# Patient Record
Sex: Female | Born: 1940 | Race: Black or African American | Hispanic: No | Marital: Single | Smoking: Never smoker
Health system: Southern US, Community
[De-identification: ages and names within clinical notes are randomized; demographics above are authoritative.]

## PROBLEM LIST (undated history)

## (undated) DIAGNOSIS — E7800 Pure hypercholesterolemia, unspecified: Secondary | ICD-10-CM

## (undated) DIAGNOSIS — I639 Cerebral infarction, unspecified: Secondary | ICD-10-CM

## (undated) DIAGNOSIS — I10 Essential (primary) hypertension: Secondary | ICD-10-CM

## (undated) HISTORY — PX: TOOTH EXTRACTION: SUR596

---

## 1999-04-15 ENCOUNTER — Encounter: Admission: RE | Admit: 1999-04-15 | Discharge: 1999-07-14 | Payer: Self-pay | Admitting: *Deleted

## 1999-06-30 ENCOUNTER — Other Ambulatory Visit: Admission: RE | Admit: 1999-06-30 | Discharge: 1999-06-30 | Payer: Self-pay | Admitting: *Deleted

## 1999-10-28 ENCOUNTER — Encounter: Admission: RE | Admit: 1999-10-28 | Discharge: 1999-10-28 | Payer: Self-pay | Admitting: *Deleted

## 1999-10-28 ENCOUNTER — Encounter: Payer: Self-pay | Admitting: *Deleted

## 2001-03-15 ENCOUNTER — Encounter: Payer: Self-pay | Admitting: Internal Medicine

## 2001-03-15 ENCOUNTER — Encounter: Admission: RE | Admit: 2001-03-15 | Discharge: 2001-03-15 | Payer: Self-pay | Admitting: Internal Medicine

## 2002-01-23 ENCOUNTER — Other Ambulatory Visit: Admission: RE | Admit: 2002-01-23 | Discharge: 2002-01-23 | Payer: Self-pay | Admitting: Internal Medicine

## 2002-05-29 ENCOUNTER — Ambulatory Visit (HOSPITAL_BASED_OUTPATIENT_CLINIC_OR_DEPARTMENT_OTHER): Admission: RE | Admit: 2002-05-29 | Discharge: 2002-05-29 | Payer: Self-pay | Admitting: Surgery

## 2002-05-29 ENCOUNTER — Encounter (INDEPENDENT_AMBULATORY_CARE_PROVIDER_SITE_OTHER): Payer: Self-pay | Admitting: *Deleted

## 2003-01-23 ENCOUNTER — Emergency Department (HOSPITAL_COMMUNITY): Admission: EM | Admit: 2003-01-23 | Discharge: 2003-01-23 | Payer: Self-pay | Admitting: Emergency Medicine

## 2003-09-29 ENCOUNTER — Emergency Department (HOSPITAL_COMMUNITY): Admission: EM | Admit: 2003-09-29 | Discharge: 2003-09-29 | Payer: Self-pay | Admitting: Emergency Medicine

## 2004-03-27 ENCOUNTER — Inpatient Hospital Stay (HOSPITAL_COMMUNITY): Admission: EM | Admit: 2004-03-27 | Discharge: 2004-03-30 | Payer: Self-pay | Admitting: Neurology

## 2004-03-27 ENCOUNTER — Encounter: Payer: Self-pay | Admitting: Cardiovascular Disease

## 2004-03-27 ENCOUNTER — Encounter: Payer: Self-pay | Admitting: Emergency Medicine

## 2004-04-09 ENCOUNTER — Encounter: Admission: RE | Admit: 2004-04-09 | Discharge: 2004-05-07 | Payer: Self-pay | Admitting: Neurology

## 2004-05-28 ENCOUNTER — Encounter: Admission: RE | Admit: 2004-05-28 | Discharge: 2004-08-26 | Payer: Self-pay | Admitting: Internal Medicine

## 2004-07-07 ENCOUNTER — Encounter: Admission: RE | Admit: 2004-07-07 | Discharge: 2004-07-07 | Payer: Self-pay | Admitting: Internal Medicine

## 2005-09-15 ENCOUNTER — Encounter: Admission: RE | Admit: 2005-09-15 | Discharge: 2005-09-15 | Payer: Self-pay | Admitting: Internal Medicine

## 2007-01-02 ENCOUNTER — Ambulatory Visit: Payer: Self-pay | Admitting: *Deleted

## 2007-01-02 ENCOUNTER — Inpatient Hospital Stay (HOSPITAL_COMMUNITY): Admission: EM | Admit: 2007-01-02 | Discharge: 2007-01-09 | Payer: Self-pay | Admitting: *Deleted

## 2007-01-03 ENCOUNTER — Ambulatory Visit: Payer: Self-pay | Admitting: Physical Medicine & Rehabilitation

## 2008-02-14 ENCOUNTER — Encounter: Admission: RE | Admit: 2008-02-14 | Discharge: 2008-02-14 | Payer: Self-pay | Admitting: Internal Medicine

## 2009-02-18 ENCOUNTER — Encounter: Admission: RE | Admit: 2009-02-18 | Discharge: 2009-02-18 | Payer: Self-pay | Admitting: Internal Medicine

## 2009-12-23 ENCOUNTER — Emergency Department (HOSPITAL_COMMUNITY): Admission: EM | Admit: 2009-12-23 | Discharge: 2009-12-23 | Payer: Self-pay | Admitting: Emergency Medicine

## 2010-06-22 ENCOUNTER — Encounter: Payer: Self-pay | Admitting: Internal Medicine

## 2010-07-16 ENCOUNTER — Emergency Department (HOSPITAL_COMMUNITY): Payer: Medicare HMO

## 2010-07-16 ENCOUNTER — Inpatient Hospital Stay (HOSPITAL_COMMUNITY)
Admission: EM | Admit: 2010-07-16 | Discharge: 2010-07-22 | DRG: 065 | Disposition: A | Discharge status: Skilled Nursing Facility | Payer: Medicare HMO | Attending: Internal Medicine | Admitting: Internal Medicine

## 2010-07-16 DIAGNOSIS — E119 Type 2 diabetes mellitus without complications: Secondary | ICD-10-CM | POA: Diagnosis present

## 2010-07-16 DIAGNOSIS — Z91199 Patient's noncompliance with other medical treatment and regimen due to unspecified reason: Secondary | ICD-10-CM

## 2010-07-16 DIAGNOSIS — G8190 Hemiplegia, unspecified affecting unspecified side: Secondary | ICD-10-CM | POA: Diagnosis present

## 2010-07-16 DIAGNOSIS — I10 Essential (primary) hypertension: Secondary | ICD-10-CM | POA: Diagnosis present

## 2010-07-16 DIAGNOSIS — E785 Hyperlipidemia, unspecified: Secondary | ICD-10-CM | POA: Diagnosis present

## 2010-07-16 DIAGNOSIS — I6340 Cerebral infarction due to embolism of unspecified cerebral artery: Principal | ICD-10-CM | POA: Diagnosis present

## 2010-07-16 DIAGNOSIS — R269 Unspecified abnormalities of gait and mobility: Secondary | ICD-10-CM | POA: Diagnosis present

## 2010-07-16 DIAGNOSIS — R1310 Dysphagia, unspecified: Secondary | ICD-10-CM | POA: Diagnosis present

## 2010-07-16 DIAGNOSIS — R4701 Aphasia: Secondary | ICD-10-CM | POA: Diagnosis present

## 2010-07-16 DIAGNOSIS — Z8673 Personal history of transient ischemic attack (TIA), and cerebral infarction without residual deficits: Secondary | ICD-10-CM | POA: Diagnosis present

## 2010-07-16 DIAGNOSIS — K5900 Constipation, unspecified: Secondary | ICD-10-CM | POA: Diagnosis present

## 2010-07-16 DIAGNOSIS — Z9119 Patient's noncompliance with other medical treatment and regimen: Secondary | ICD-10-CM

## 2010-07-16 LAB — GLUCOSE, CAPILLARY: Glucose-Capillary: 221 mg/dL — ABNORMAL HIGH (ref 70–99)

## 2010-07-16 LAB — CBC
HCT: 42.3 % (ref 36.0–46.0)
MCHC: 32.2 g/dL (ref 30.0–36.0)
RBC: 5.06 MIL/uL (ref 3.87–5.11)
RDW: 14.4 % (ref 11.5–15.5)
WBC: 8.3 10*3/uL (ref 4.0–10.5)

## 2010-07-16 LAB — URINALYSIS, ROUTINE W REFLEX MICROSCOPIC
Ketones, ur: NEGATIVE mg/dL
Leukocytes, UA: NEGATIVE
Nitrite: NEGATIVE
Urobilinogen, UA: 0.2 mg/dL (ref 0.0–1.0)
pH: 7 (ref 5.0–8.0)

## 2010-07-16 LAB — POCT CARDIAC MARKERS
CKMB, poc: 2.2 ng/mL (ref 1.0–8.0)
Myoglobin, poc: 284 ng/mL (ref 12–200)
Troponin i, poc: <0.05 ng/mL (ref 0.00–0.09)

## 2010-07-16 LAB — RAPID URINE DRUG SCREEN, HOSP PERFORMED
Amphetamines: NOT DETECTED
Benzodiazepines: NOT DETECTED
Cocaine: NOT DETECTED
Tetrahydrocannabinol: NOT DETECTED

## 2010-07-16 LAB — POCT I-STAT, CHEM 8
BUN: 12 mg/dL (ref 6–23)
Creatinine, Ser: 1 mg/dL (ref 0.4–1.2)
Glucose, Bld: 113 mg/dL — ABNORMAL HIGH (ref 70–99)
HCT: 46 % (ref 36.0–46.0)
Hemoglobin: 15.6 g/dL — ABNORMAL HIGH (ref 12.0–15.0)
Potassium: 5.1 mEq/L (ref 3.5–5.1)
TCO2: 29 mmol/L (ref 0–100)

## 2010-07-16 LAB — DIFFERENTIAL
Basophils Absolute: 0 10*3/uL (ref 0.0–0.1)
Lymphocytes Relative: 12 % (ref 12–46)
Lymphs Abs: 1 10*3/uL (ref 0.7–4.0)
Monocytes Relative: 11 % (ref 3–12)
Neutro Abs: 6.4 10*3/uL (ref 1.7–7.7)

## 2010-07-17 ENCOUNTER — Inpatient Hospital Stay (HOSPITAL_COMMUNITY): Payer: Medicare HMO

## 2010-07-17 DIAGNOSIS — I6789 Other cerebrovascular disease: Secondary | ICD-10-CM

## 2010-07-17 DIAGNOSIS — I6350 Cerebral infarction due to unspecified occlusion or stenosis of unspecified cerebral artery: Secondary | ICD-10-CM

## 2010-07-17 LAB — COMPREHENSIVE METABOLIC PANEL
AST: 24 U/L (ref 0–37)
Alkaline Phosphatase: 63 U/L (ref 39–117)
Creatinine, Ser: 1.12 mg/dL (ref 0.4–1.2)
Potassium: 3.7 mEq/L (ref 3.5–5.1)
Total Bilirubin: 0.5 mg/dL (ref 0.3–1.2)
Total Protein: 6.3 g/dL (ref 6.0–8.3)

## 2010-07-17 LAB — LIPID PANEL
Cholesterol: 187 mg/dL (ref 0–200)
HDL: 52 mg/dL (ref >39)
LDL Cholesterol: 122 mg/dL — ABNORMAL HIGH (ref 0–99)
Total CHOL/HDL Ratio: 3.6 RATIO
Triglycerides: 67 mg/dL (ref <150)

## 2010-07-17 LAB — GLUCOSE, CAPILLARY
Glucose-Capillary: 135 mg/dL — ABNORMAL HIGH (ref 70–99)
Glucose-Capillary: 88 mg/dL (ref 70–99)

## 2010-07-17 LAB — CARDIAC PANEL(CRET KIN+CKTOT+MB+TROPI)
CK, MB: 1.5 ng/mL (ref 0.3–4.0)
CK, MB: 1.3 ng/mL (ref 0.3–4.0)
Relative Index: 1 (ref 0.0–2.5)
Relative Index: 0.7 (ref 0.0–2.5)
Troponin I: 0.01 ng/mL (ref 0.00–0.06)

## 2010-07-17 LAB — HEMOGLOBIN A1C
Hgb A1c MFr Bld: 7 % — ABNORMAL HIGH (ref <5.7)
Mean Plasma Glucose: 154 mg/dL — ABNORMAL HIGH (ref <117)

## 2010-07-18 LAB — GLUCOSE, CAPILLARY: Glucose-Capillary: 97 mg/dL (ref 70–99)

## 2010-07-19 LAB — BASIC METABOLIC PANEL
CO2: 27 mEq/L (ref 19–32)
Calcium: 8.9 mg/dL (ref 8.4–10.5)
GFR calc Af Amer: >60 mL/min (ref >60)
GFR calc non Af Amer: >60 mL/min (ref >60)
Sodium: 142 mEq/L (ref 135–145)

## 2010-07-19 LAB — GLUCOSE, CAPILLARY: Glucose-Capillary: 220 mg/dL — ABNORMAL HIGH (ref 70–99)

## 2010-07-19 NOTE — Consult Note (Signed)
[name] [name]                 ACCOUNT NO.:  [id number]  MEDICAL RECORD NO.:  [id number]           PATIENT TYPE:  I  LOCATION:  1408                         FACILITY:  [facility]  PHYSICIAN:  Thana Farr, MD    DATE OF BIRTH:  Jun 09, 1940  DATE OF CONSULTATION:  07/18/2010 DATE OF DISCHARGE:                                CONSULTATION   HISTORY:  [name] [name] is a 70 year old female with a history of strokes in the past as well as hypertension and diabetes who presented with complaints of right upper extremity weakness.  The patient has a residual right hemiparesis from her previous strokes but noted when she was in the bank that she was unable to use her right upper extremity as well as she had previously been able to.  The patient presented for evaluation.  Has had an MRI of the brain.  MRI shows multiple acute infarcts, most notably in the bilateral basal ganglion more on the right than the left and also in the inferior cerebellum on the right.  MRA was performed that showed no proximal stenosis.  There was diffuse small vessel arteriosclerosis noted.  The patient reports that she has not seen her doctor in some time.  She reports not liking doctors.  She also reports not being noncompliant with her medications, and actually, her hemoglobin A1c is elevated at 7 and her blood pressure was elevated on admission as well at 203/102.  PAST MEDICAL HISTORY: 1. 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 2. Diabetes. 3. Hypertension. 4. Hypercholesterolemia.  MEDICATIONS AT HOME: 1. Metformin. 2. Amaryl. 3. Januvia. 4. Lisinopril. 5. Cardizem. 6. Simvastatin.  CURRENT MEDICATIONS: 1. Aspirin. 2. Plavix. 3. Diltiazem. 4. Lovenox. 5. Insulin. 6. Lisinopril. 7. Crestor.  SOCIAL HISTORY:  The patient lives alone.  She has no history of alcohol or illicit drug abuse.  She does not smoke.  She is retired.  PHYSICAL EXAMINATION:  VITAL SIGNS:  Blood pressure  201/74, heart rate 69, respiratory rate 16, T-max 99.3. MENTAL STATUS TESTING:  The patient is resting when I entered the room. It does take some effort to arouse her, but she is able then to follow commands and participate with the examination.  Speech is slurred. NEUROLOGIC:  On cranial nerve testing, II; visual fields grossly intact. III, IV, and VI; extraocular movements intact.  Pupils reactive.  V and VII; right facial droop.  VIII; grossly intact.  IX and X; decreased gag.  XI; bilateral shoulder shrug.  XII; tongue deviation to the right. On motor testing, the patient has 5/5 on both the left upper and lower extremities.  On the right upper extremity, the patient is 3+/5 and has a 3+/5 hand grip.  She is 4+/5 on the right lower extremity.  On sensory testing, the patient was intact to pinprick and light touch bilaterally. Deep tendon reflexes are increased on the right.  They are at 2+ on the right and 1+ on the left.  Plantars upgoing on the right and downgoing on the left.  On cerebellar testing, finger-to-nose is intact in the left upper extremity.  It is difficult to perform with the right upper extremity secondary to weakness.  It is intact in the bilateral lower extremities.  LABORATORY DATA:  White blood cell count 8.3, platelet count 256,000, hemoglobin/hematocrit are 15.6 and 46.0 respectively.  PT, INR, and PTT are 13.5, 1.01, and 35 respectively.  Sodium 141, potassium 3.7, chloride 106, CO2 of 27, BUN and creatinine 9 and 1.12 respectively. Glucose 172.  Hemoglobin A1c 7.  Troponins are negative.  Cholesterol is 187, LDL 122, calcium 9, protein 6.3, bilirubin 0.5, alk phos 63, SGOT and SGPT 24 and 17 respectively.  ASSESSMENT:  [name] [name] is a 70 year old female with acute infarcts in multiple vascular distributions.  I am concerned about an embolic etiology.  The patient is also noncompliant with medications and with her medical issues.  This needs to be stressed  as well.  PLAN: 1. Agree with an aspirin a day for stroke prophylaxis. 2. Agree with PT/OT. 3. Compliance stressed to the patient. 4. If echocardiogram is unremarkable, TEE recommended.          ______________________________ Thana Farr, MD     LR/MEDQ  D:  07/18/2010  T:  07/18/2010  Job:  [id number]  Electronically Signed by Thana Farr MD on 07/19/2010 12:49:09 AM

## 2010-07-20 LAB — BASIC METABOLIC PANEL
BUN: 11 mg/dL (ref 6–23)
Chloride: 108 mEq/L (ref 96–112)
Glucose, Bld: 129 mg/dL — ABNORMAL HIGH (ref 70–99)
Potassium: 3.4 mEq/L — ABNORMAL LOW (ref 3.5–5.1)

## 2010-07-20 LAB — CBC
HCT: 40.7 % (ref 36.0–46.0)
MCV: 85 fL (ref 78.0–100.0)
RBC: 4.79 MIL/uL (ref 3.87–5.11)
WBC: 6.7 10*3/uL (ref 4.0–10.5)

## 2010-07-20 LAB — DIFFERENTIAL
Lymphocytes Relative: 28 % (ref 12–46)
Lymphs Abs: 1.9 10*3/uL (ref 0.7–4.0)
Neutrophils Relative %: 60 % (ref 43–77)

## 2010-07-20 LAB — GLUCOSE, CAPILLARY
Glucose-Capillary: 123 mg/dL — ABNORMAL HIGH (ref 70–99)
Glucose-Capillary: 108 mg/dL — ABNORMAL HIGH (ref 70–99)
Glucose-Capillary: 157 mg/dL — ABNORMAL HIGH (ref 70–99)

## 2010-07-21 LAB — GLUCOSE, CAPILLARY
Glucose-Capillary: 108 mg/dL — ABNORMAL HIGH (ref 70–99)
Glucose-Capillary: 101 mg/dL — ABNORMAL HIGH (ref 70–99)

## 2010-07-22 LAB — GLUCOSE, CAPILLARY: Glucose-Capillary: 138 mg/dL — ABNORMAL HIGH (ref 70–99)

## 2010-08-06 NOTE — Discharge Summary (Signed)
[name], [name]                 ACCOUNT NO.:  [id number]  MEDICAL RECORD NO.:  [id number]           PATIENT TYPE:  I  LOCATION:  1408                         FACILITY:  [facility]  PHYSICIAN:  Georgann Housekeeper, MD      DATE OF BIRTH:  05/25/1941  DATE OF ADMISSION:  07/16/2010 DATE OF DISCHARGE:  07/21/2010                              DISCHARGE SUMMARY   DISCHARGE DIAGNOSES: 1. Acute cerebrovascular accident with left cerebrovascular accident     with right hemiparesis. 2. Dysphagia. 3. Aphasia.  The MRI suggest multiple acute infarcts in the bilateral     basal ganglia and on the right and on the left and inferior     cerebellar on the right.  MRA no proximal stenosis and diffuse     arthroscopic disease.  A 2-D echocardiogram showed normal LV     function without any emboli.  Carotid ultrasound no evidence of any     acute plaque and dysphagia due to stroke. 4. Gait disorder with some right-sided weakness, upper extremity more     than the lower extremity. 5. History of hypertension with noncompliance. 6. History of type 2 diabetes. 7. History of dyslipidemia. 8. History of previous stroke in 2008 with some right residual     hemiparesis on medication. 9. Constipation.  MEDICATION AT DISCHARGE: 1. Lisinopril 20 mg b.i.d. 2. Tylenol 650 mg p.o. q.4 h. p.r.n. 3. Metformin 500 mg one p.o. b.i.d. 4. Cardizem CD 240 mg p.o. daily. 5. MiraLax 17 g p.o. daily. 6. Dulcolax 10 mg suppositories p.r.n. daily. 7. Januvia 100 mg daily. 8. Crestor 5 mg q.h.s. 9. Dysphagia diet with thickened. 10.Aspirin 325 mg p.o. daily. 11.Plavix 75 mg p.o. daily.  CONSULTATION:  Neurology.  PROCEDURES:  Radiology procedures includes CT scan which showed no acute infarct.  MRI suggest multiple infarct in the basal ganglions with old lacunar infarct and also on the inferior cerebellum on the right.  MRA, no acute stenosis.  A 2-D echo showed normal LV function without any emboli and normal valves.   Carotid ultrasound, no significant stenosis.  LABORATORY DATA:  CBC, hemoglobin of 12.9 and white count of 6.7. Potassium of 3.4, BUN of 11, creatinine of 0.9, and glucose of 129. Cardiac markers x3 negative.  Cholesterol total 187 with LDL of 122. Urine negative.  Cultures negative.  Hemoglobin A1c 7.0.  PHYSICAL EXAMINATION:  VITAL SIGNS:  Blood pressure range from anywhere systolic 143 to 180, heart rate in the 50s to 60s in normal sinus rhythm, and with blood sugars 108 to 150.  HOSPITAL COURSE: 1. This is a 70 year old female who was noncompliant with medication.     She has stopped taking her blood pressure and diabetic medication     and came in with elevated hypertension over 200, found to have     right-sided weakness, dysarthria, and dysphagia, found to have     acute stroke and multiple punctate on the MRI and she was started     on Plavix and aspirin.  Neurology was consulted because of the     multiple small emboli thought to  be embolic in origin.  Her 2-D     echocardiogram was negative, suggest a TEE because of her noncompliance with the medication in the past.  Decided to hold off     the TEE because she would not a good candidate for Coumadin at this     point because her gait disorder as well as her noncompliance and     the risk for bleeding.  The patient will be maintained on aspirin     and Plavix at this time. 2. History of hypertension with accelerated hypertension with stopping     the medication she was started back on her lisinopril which was     dose adjusted to 20 mg b.i.d. from 30 mg.  Continued on Cardizem     240 mg.  Blood pressure range from 140 to 180 if needed medication     could be adjusted to keep the blood pressure below 180 and above     120. 3. Type 2 diabetes.  The patient was not taking a diabetic medication.     She was restarted back on metformin and Januvia.  She was also on     Amaryl at home which she was not taking.  Her blood sugar  remained     stable in the 108 to 150 with the Januvia and metformin.  Monitor     blood sugars and CBGs in the morning and if needed we can add the     low dose of Amaryl for glucose control. 4. Dyslipidemia.  She was on Zocor at home, but because of interaction     with the diltiazem was switched to Crestor 5 mg daily and as far as     her dysphagia she is on dysphagia diet with PT and OT.     Recommending stiff care and because the patient lives alone as far     as she will be transferred to the nursing facility with rehab and     speech therapy and dysphagia diet. 5. Constipation.  The patient did well with MiraLax and Dulcolax     p.r.n.  Maintain that with a diet as far as the followup after she     comes out of the nursing facility.  Discharge planning time taken     greater than 30 minutes.     Georgann Housekeeper, MD     KH/MEDQ  D:  07/21/2010  T:  07/21/2010  Job:  [id number]  Electronically Signed by Georgann Housekeeper MD on 08/04/2010 09:39:28 PM

## 2010-08-15 LAB — POCT I-STAT, CHEM 8
Chloride: 104 mEq/L (ref 96–112)
Creatinine, Ser: 1 mg/dL (ref 0.4–1.2)
Glucose, Bld: 124 mg/dL — ABNORMAL HIGH (ref 70–99)
Hemoglobin: 15.6 g/dL — ABNORMAL HIGH (ref 12.0–15.0)
Potassium: 3.4 mEq/L — ABNORMAL LOW (ref 3.5–5.1)
Sodium: 140 mEq/L (ref 135–145)

## 2010-08-15 LAB — POCT URINALYSIS DIP (DEVICE)
Bilirubin Urine: NEGATIVE
Glucose, UA: NEGATIVE mg/dL
Ketones, ur: NEGATIVE mg/dL
Specific Gravity, Urine: 1.025 (ref 1.005–1.030)

## 2010-08-16 NOTE — H&P (Signed)
[name], [name]                 ACCOUNT NO.:  [id number]  MEDICAL RECORD NO.:  [id number]           PATIENT TYPE:  E  LOCATION:  WLED                         FACILITY:  [facility]  PHYSICIAN:  [name] Scott, MD     DATE OF BIRTH:  July 06, 1940  DATE OF ADMISSION:  07/16/2010 DATE OF DISCHARGE:                             HISTORY & PHYSICAL   PRIMARY CARE PHYSICIAN:  [name] Housekeeper, MD  CHIEF COMPLAINT:  Right-sided weakness.  HISTORY OF PRESENT ILLNESS:  Ms. [name] is a pleasant 70 year old [name] female patient with history of 2 prior strokes with residual right hemiparesis,  type 2 diabetes mellitus, hypertension, hypercholesterolemia who has been noncompliant with her medication for months.  She was at a bank and noticed sudden onset of right-sided weakness at approximately 3 p.m. yesterday.  She found that she was unable to write a cheque or hold a pen.  She indicated that her right upper extremity weakness was greater than the right lower extremity weakness.  She at baseline has some slurred speech and facial asymmetry and did not seem to find any difference in that.  There was no history of headache or chest pain or palpitations or dyspnea.  She did not lose consciousness or fall. She presented to the emergency room where her initial blood pressure was 203/102 mmHg.  The [facility] hospitalist were requested to admit her for further management.  PAST MEDICAL HISTORY: 1. 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 2. Type 2 diabetes mellitus. 3. Hypertension. 4. Hyperlipidemia.  PAST SURGICAL HISTORY:  None.  ALLERGIES:  No known drug allergies.  HOME MEDICATIONS:  The patient has not taken her medications for several months.  She was on, 1. Metformin 500 mg p.o. b.i.d. 2. Amaryl 4 mg p.o. daily. 3. Januvia 100 mg p.o. daily. 4. Lisinopril 30 mg p.o. daily. 5. Cardizem CD 240 mg p.o. daily. 6. Simvastatin 20 mg p.o. daily.  FAMILY HISTORY:  The  patient's mother is 42 years old and is living at a nursing home.  She has history of heart disease and dementia and is not able to care for herself.  SOCIAL HISTORY:  The patient is single.  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  She ambulates and drags her right feet, but does not use a cane or walker.  She is independent of activities of daily living.  She never smoked and does not have any history of alcohol or drug abuse.  Advance directives, full code.  REVIEW OF SYSTEMS:  All systems reviewed and apart from history of presenting illness is positive for urinary frequency, but no dysuria or fever or chills.  The patient indicates that she has had food to eat since her symptoms began yesterday and has had no difficulty swallowing or cough.  The patient has passed the swallow screen by the nursing in the ED.  PHYSICAL EXAMINATION:  GENERAL:  Ms. [name] is a moderately built and nourished female patient who is in no obvious distress. VITAL SIGNS:  Temperature 99.2 degrees Fahrenheit, blood pressure  which was 203/102 mmHg is now 179/78 mmHg, pulse 77 per minute, respiration 18 per minute, and saturating at 94% on room air. HEENT:  Nontraumatic, normocephalic.  Pupils equally reacting to light and accommodation.  Bilateral immature cataracts.  Oral mucosa is moist. NECK:  Supple.  No JVD or carotid bruit. LYMPHATICS:  No lymphadenopathy. RESPIRATORY SYSTEM:  Clear to auscultation.  No increased work of breathing. CARDIOVASCULAR SYSTEM:  First and second heart sounds heard.  Regular rate and rhythm.  No murmurs, rubs, gallops, or clicks. ABDOMEN:  Nondistended, nontender, soft, normal bowel sounds heard.  No organomegaly or mass appreciated. CENTRAL NERVOUS SYSTEM:  The patient is awake, alert, oriented x3.  The patient has dysarthria, question aphasia, and facial asymmetry. EXTREMITIES:  With grade 3/5 power in the right side and  grade 5/5 power in the left side.  No pedal edema.  Peripheral pulses symmetrically felt. SKIN:  Without any rashes. MUSCULOSKELETAL SYSTEM:  Negative.  LABORATORY DATA:  INR is 1.01.  Urinalysis is not suggestive of urinary tract infection, but has 100 mg/dL of protein, blood alcohol level was less than 5. Point-of-care cardiac markers only shows myoglobin of 284, otherwise negative.  A urine drug screen is negative.  CBCs within normal limits. Basic metabolic panel only remarkable for glucose of 113.  1. Chest x-ray - impression:  Cardiomegaly.  No active disease. 2. CT of the head without contrast - impression:     a.     Scattered remote lacunar infarcts.     b.     Periventricular and corona radiata white matter      hypodensities are most compatible with chronic ischemic      microvascular white matter disease.     c.     No acute intracranial findings are evident on this exam.  EKG is sinus rhythm at 74 beats per minute, normal axis, left ventricular hypertrophy with repolarization abnormalities, Q waves in lead V1 and V2.  ASSESSMENT AND PLAN: 1. Left brain stroke, likely infarct with right hemiparesis.  This isrecurrent cerebrovascular accident.  The patient has had a left     posterior limb internal capsule and paracentral pons and inferior     left cerebellum strokes in the past. She is beyond window for tPA.     She will be admitted to Telemetry.  We will initiate stroke workup     including MRI, MRA of the head, 2-D echocardiogram, carotid     Dopplers, hemoglobin A1c, fasting lipids.  The patient has passed     bedside swallow evaluation by RN.  She will be initiated on a     diabetic diet.  We will request physical therapy and occupational     therapy evaluation. Obviously, it has been explained to the patient     that she has to comply with medical management of the secondary     risk factors such as diabetes, hypertension, and     hypercholesterolemia.  Consider  neurology consultation if deemed     necessary. 2. Uncontrolled hypertension/hypertensive urgency.  The patient has     received a dose of IV labetalol and nitroglycerin paste with     improved blood pressure control.  We will initiate lisinopril and     p.r.n. hydralazine.  We will allow permissive hypertension and     treat if blood pressures are greater than 180/110. 3. Type 2 diabetes mellitus.  We will check hemoglobin A1c and place     on sliding  scale insulin and hold oral medications. 4. Hypercholesterolemia.  We will initiate simvastatin. 5. Medication noncompliance.  Again the patient has to be counseled     repeatedly regarding compliance with medications. 6. The patient is a full code.  Time taken in coordinating this history and physical note is 1 hour.     [name] Scott, MD     AH/MEDQ  D:  07/16/2010  T:  07/16/2010  Job:  [id number]  cc:   [name] Housekeeper, MD Fax: [phone]  Electronically Signed by [name] Scott MD on 08/16/2010 11:25:39 PM

## 2010-10-13 NOTE — H&P (Signed)
NAMECRYSTIN, [name]                 ACCOUNT NO.:  [id number]   MEDICAL RECORD NO.:  [id number]          PATIENT TYPE:  INP   LOCATION:  1440                         [facility]AC[facility]   PHYSICIAN:  Georgann Housekeeper, MD      DATE OF BIRTH:  08/16/1940   DATE OF ADMISSION:  01/01/2007  DATE OF DISCHARGE:                              HISTORY & PHYSICAL   [name] is a 70 year old female with past medical history of a right-  sided CVA, diabetes mellitus and hypertension who is coming in today  with approximately 12 hours of confusion and slurred speech.  This was  noticed by a friend who called her on the phone and then came over to  her house and noticed that she was somewhat confused, was having slurred  speech.  She was brought to the emergency department where over the  course of the next couple hours, her slurring speech had improved to the  point where she was almost back to normal according to the friend.  She  does have some deficits from a previous stroke including some facial  droop on the right and some right-sided weakness as well as some slurred  speech.  However, today's episode was worse then her normal self.  When  she woke up on Sunday morning, she was fine and she was able to go to  church and she was otherwise well. She denies any headache, dizziness,  blurred vision, chest pain, dysphagia, and her other review of systems  otherwise negative.   PAST MEDICAL HISTORY:  As above.   FAMILY HISTORY:  Noncontributory.   SOCIAL HISTORY:  No tobacco, no alcohol.  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   DRUG ALLERGIES:  No known drug allergies.Marland Kitchen   MEDICATIONS:  1. Cardizem 240 mg p.o. daily.  2. Aggrenox 400 mg p.o. b.i.d.  3. Metformin 500 mg p.o. daily.   PHYSICAL EXAMINATION:  VITAL SIGNS:  Blood pressure 252/118, currently  160/77, pulse 69, respirations 27, temperature 97.5.  GENERAL:  She is lying flat.  She is answering questions  appropriately.  HEENT:  Facial droop on the right is evident.  No oral lesions.  Mucous  membranes are a little bit dry.  NECK:  Within normal limits.  LUNGS:  Clear to auscultation bilaterally.  No wheezing, rales or  rhonchi.  HEART:  Regular rate and rhythm.  No murmur, rub, or gallops.  ABDOMEN:  Soft, nontender, nondistended.  Positive bowel sounds.  EXTREMITIES:  Muscle strain is 3/5 in the right upper and lower  extremities.  5/5 in the left upper and lower extremities.  NEUROLOGIC:  She does have some slurred speech, some obvious right-sided  facial droop.  Her right-sided weakness again noted.  Her cranial nerves  appear to be intact.  There were no other focal deficits.   LABORATORY DATA:  CT scan of the head showed no acute hemorrhage.  CBC  and chemistry both normal.  EKG was normal sinus rhythm.  Normal axis.  Nonspecific ST-T changes.   IMPRESSION:  1. Cerebrovascular accident.  2. Hypertension, uncontrolled.  3. Diabetes.  4. Nonadherence.   PLAN:  1. Control her blood pressure by making sure that she is taking her      medications.  2. Will check an MRI of the head.  3. Carotid Dopplers.  4. Check a lipid panel.  5. I will add a sliding scale for her excess insulin coverage.  Her      glucose was normal upon arrival to the emergency department.  6. I will need to consult neurology in the morning.  7. Will continue her Aggrenox at its current dose.  8. According to her friend, the patient may not have been taking all      her medications as scheduled.  9. Deep venous thrombosis prophylaxis with Lovenox.     ______________________________  Noe Gens, MD  Electronically Signed    TB/MEDQ  D:  01/02/2007  T:  01/02/2007  Job:  [id number]

## 2010-10-13 NOTE — Discharge Summary (Signed)
[name], [name]                 ACCOUNT NO.:  [id number]   MEDICAL RECORD NO.:  [id number]          PATIENT TYPE:  INP   LOCATION:  1440                         FACILITY:  [facility]   PHYSICIAN:  Theressa Millard, M.D.    DATE OF BIRTH:  [birth year]   DATE OF ADMISSION:  01/01/2007  DATE OF DISCHARGE:  01/06/2007                               DISCHARGE SUMMARY   ADMITTING DIAGNOSIS:  Neurologic changes consistent with acute stroke.   DISCHARGE DIAGNOSES:  1. New left brain stroke affecting the posterior limb of the left      internal capsule on the left peri-opercular region.  2. History of left brain strokes affecting the left paracentral pons      and inferior left cerebellum.  3. Atherosclerotic cerebrovascular disease.  4. Poorly-controlled diabetes mellitus.  5. Hypercholesterolemia.  6. Hypertension.   The patient is a 70 year old black female with a history of stroke.  She  has trouble with slurred speech and worsening  right-sided weakness over  her usual baseline.  She was brought to the emergency department.   HOSPITAL COURSE:  The patient was seen initially by neurology and then  referred to medicine for admission.  She had evidence of new strokes on  MRI and MRA.  She was seen in consultation by physical therapy and  occupational therapy and they worked with the patient during her  hospitalization.  Of note is the fact that her carotid Dopplers did not  show any significant extracranial disease but  her MRA did show  prominent intracranial atherosclerotic changes.   During the hospitalization, her hemoglobin A1c came back 9.6 and it was  rather clear that the patient was not taking care of her diabetes  peripherally.  This was reviewed with the patient during the  hospitalization.  In addition, her cholesterol showed a total  cholesterol 248, LDL of 175 and HDL of 49.  She was therefore started on  simvastatin.   She progressed with OT and PT and there was some debate  as to whether  she would be better served by going to a skilled nursing facility, but  the patient desires to return home and we think she is going to be  capable of doing that with 24-hour care at home.  She has arrange for  family members to be with her 24 hours a day.  We have arranged home  health nurse, OT, PT and home health aide.   DISCHARGE MEDICATIONS:  1. Cardizem CD 240 mg once daily.  2. Metformin 500 mg b.i.d.  3. Simvastatin 20 mg daily (new med).  4. Januvia 100 mg daily (new med).  5. Lisinopril 10 mg daily (new med).  6. Aspirin 325 mg once daily (new med).  7. She is to discontinue Aggrenox which made her sick.   ACTIVITIES:  Per PT and OT and increase activity slowly.   DIET:  No added salt.   FOLLOW UP:  Make an appointment to see Dr. Donette Larry in two weeks.      Theressa Millard, M.D.  Electronically Signed  JO/MEDQ  D:  01/06/2007  T:  01/06/2007  Job:  [id number]

## 2010-10-13 NOTE — Consult Note (Signed)
NAMEJURNEI, LATINI                 ACCOUNT NO.:  [id number]   MEDICAL RECORD NO.:  [id number]          PATIENT TYPE:  INP   LOCATION:  1440                         FACILITY:  [facility]   PHYSICIAN:  Bevelyn Buckles. Champey, M.D.DATE OF BIRTH:  Nov 24, 1940   DATE OF CONSULTATION:  DATE OF DISCHARGE:                                 CONSULTATION   REFERRING PHYSICIAN:  Shelda Jakes, MD and Georgann Housekeeper, MD   REASON FOR CONSULTATION:  Stroke.   HISTORY OF PRESENT ILLNESS:  Ms. Ouk is a 70 year old African  American female with multiple medical problems including prior history  of a left pontine infarct who presents with confusion, speech difficulty  that started yesterday.  It is uncertain the exact time of onset.  However, the patient states she believes it was around lunch time.  The  patient's symptoms slightly did improve.  However, the patient is still  not back to baseline with some slurred speech.  She also had confusion  at that time which has completely resolved.  She denies any symptoms of  focal weakness in her extremities, numbness, vision changes, dizziness,  swallowing problems, chewing problems, vertigo, falls or loss of  consciousness.   PAST MEDICAL HISTORY:  1. 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  2. Hypertension.  3. Diabetes.   CURRENT MEDICATIONS:  1. Cardizem.  2. Metformin.  3. Aggrenox (only taking once daily).   ALLERGIES:  The patient has no known drug allergies.   FAMILY HISTORY:  Positive for hypertension, diabetes.   SOCIAL HISTORY:  The patient lives with her mother.  Denies any tobacco,  alcohol or drug use.   REVIEW OF SYSTEMS:  Positive as per HPI.   REVIEW OF SYSTEMS:  Negative as per HPI and greater than 8 other organ  systems.   PHYSICAL EXAMINATION:  VITAL SIGNS:  Temperature 97.6, pulse 66,  respirations 18, blood pressure 165/57, O2 saturation 95%.  HEENT:  Normocephalic, atraumatic.  Extraocular movements intact.  The  patient  does seem to have a slight right facial droop.  NECK:  Supple.  No carotid bruits are heard.  HEART:  Regular.  LUNGS:  Clear.  ABDOMEN:  Soft.  EXTREMITIES:  Good pulses.  NEUROLOGICAL:  The patient is awake, alert and oriented x3.  Language is  dysarthric.  The patient follows commands appropriately.  Cranial  nerves:  The patient has slight right facial droop.  Cranial nerves II-  XII are grossly intact.  Motor examination:  The patient has subtle  weakness on the right, 4+/5 strength on the left side with 5/5 strength  and subtle right downward drift.  MUSCULOSKELETAL:  Within normal limits to light touch.  Reflexes 1+  throughout.  Toes are neutral bilaterally.  Cerebellar function is  within normal limits, finger-to-nose.  Gait is not assessed secondary to  safety.  CT of the head showed no acute abnormalities and no bleed with  small vessel disease.   LABORATORY DATA:  WBC 7.9, hemoglobin 13.6, hematocrit 40.8, platelets  293.  Sodium 142, potassium 3.9, chloride 107, CO2 28, BUN 14,  creatinine 0.88, glucose 166, calcium 10.2, LFTs within normal limits.   IMPRESSION:  This is a 70 year old African American female with right  facial droop, slurred speech and subtle right sided weakness, likely a  left hemispheric stroke.  The patient not a candidate for tPA since it  has been greater than 3 hours out from onset, and we do not have an  exact time of onset.  Recommend checking MRI/MRA of the brain.  Ordered  2D echocardiogram, carotid Dopplers, lipids and homocysteine level.  I  agree with placing the patient on Aggrenox b.i.d. which is already  ordered.  Will get PT/OT and speech consults.  Continue the patient's  home medications.  Monitor and follow her blood pressure and triggers  closely.  Will follow the patient as consultants.  May need to consider  doing MRA of the neck in the future, given the patient's history of  pontine stroke and possibly some right vertebral  stenosis.      Bevelyn Buckles. Nash Shearer, M.D.  Electronically Signed     DRC/MEDQ  D:  01/02/2007  T:  01/02/2007  Job:  [id number]

## 2010-10-16 NOTE — Consult Note (Signed)
[name], [name]                 ACCOUNT NO.:  [id number]   MEDICAL RECORD NO.:  [id number]          PATIENT TYPE:  INP   LOCATION:  3007                         FACILITY:  [facility]   PHYSICIAN:  Casimiro Needle L. Reynolds, M.D.DATE OF BIRTH:  07/20/1940   DATE OF CONSULTATION:  03/27/2004  DATE OF DISCHARGE:                                   CONSULTATION   REFERRING PHYSICIAN:  [facility] emergency room.   REASON FOR EVALUATION:  Slurred speech.   HISTORY OF PRESENT ILLNESS:  This is the initial inpatient consultation  evaluation of this 70 year old woman with a past medical history which  includes hypertension and diabetes. The patient was at work today on the  floor at [employer] when she suddenly felt dizzy and generally  weak. She was noted by staff on the floor to have slurred speech. Her blood  pressure was checked and it was elevated. Her blood sugar was also checked  and it was somewhat elevated, but not extremely high. She is brought to the  emergency room where her symptoms have been stable. She had nausea earlier,  but does not presently have any nausea at this time. She denies any  associated vertigo, visual changes, focal symptoms in the extremities. There  is no history of stroke or previous similar symptoms.   PAST MEDICAL HISTORY:  Remarkable for hypertension or diabetes. She denies  any history of coronary artery disease or stroke. She also has glaucoma and  takes drops for that.   FAMILY HISTORY:  Remarkable for diabetes and hypertension as well as  presumed stroke in an uncle.   SOCIAL HISTORY:  She does not smoke. She works as a Psychologist, sport and exercise at [employer] and normally is independent in activities of daily living.   ALLERGIES:  No known drug allergies.   MEDICATIONS:  Aspirin daily, although she does not take this consistently.  She also takes Cardizem and eye drops for glaucoma and was recently given  some new medications of which she is  unable to name.   REVIEW OF SYSTEMS:  Negative except as outlined in the HPI, admission H&P,  and nursing records.   PHYSICAL EXAMINATION:  VITAL SIGNS: Temperature 98.3, blood pressure 194/80  down to 160/60, pulse 68, respirations 22, O2 saturation 98% on room air.  GENERAL: This is an obese, but healthy appearing woman supine in the  hospital bed, in no evident distress.  HEENT: Cranium is normocephalic and atraumatic. Oropharynx benign.  NECK: Supple without carotid bruits.  HEART: Regular rate and rhythm without murmurs.  NEUROLOGIC: On mental status exam, she is awake, alert, and oriented to  time, place, and person. Recent and remote memory are intact. Attention  span, concentration, and fund of knowledge are appropriate. Speech is fluent  and mildly dysarthric. There are no defects to confrontational naming and  she can repeat a phrase. Mood is euthymic. Affect is appropriate. Cranial  nerves reveals funduscopic exam is Benign. Pupils are equal and briskly  reactive. Extraocular movements are full without nystagmus. Visual fields  are full to confrontation. Hearing  is intact to conversational speech.  Facial sensation is intact to pinprick. Tongue and palate move normally and  symmetrically. There is mild right facial droop with a bit of weakness in  the lower face.  Shoulder shrug strength is normal. On motor testing, normal  bulk and tone, normal strength in all tested extremity muscle. Sensation  intact to pinprick in all extremities.  Coordination testing reveals rapid  movements are performed well. Finger-to-nose is performed adequately.  On  gait testing, she arises easily from the gurney, is able to ambulate,  although she does have some subjective complaints of dizziness. Reflexes are  2+ and symmetric; toes are downgoing.   LABORATORY REVIEW:  CBC: White count 6.8, hemoglobin 13.9, platelet count  311,000. BMET is remarkable only for an elevated glucose of 154,  elevated  total protein of 8.6 with an albumin of 4.4. Other chemistries and liver  functions are normal. Coags are normal.   EKG is pending. CT of the head performed today is personally reviewed and is  remarkable for some subcortical white matter disease strictly in the frontal  areas without an acute finding.   IMPRESSION:  Dysarthria and right facial weakness most likely due to a small  left brain subcortical stroke. She had presented in the early hours, but  thrombolysis will be deferred given the minor symptomatology and the fact  that it is most likely due to small vessel disease. The risk factors include  hypertension, diabetes.  Possible other factors unknown.   RECOMMENDATIONS:  Would proceed with routine stroke workup including MRI of  the brain, MRI of the head and neck, 2-D echocardiogram, telemetry  monitoring, carotid and transcranial Dopplers, and routine labs including  fasting homocysteine, probable fasting lipid panel, and hemoglobin A1C as  well. She could be continued on aspirin at a more consistent dose, although  my suggestion would be to discontinue aspirin and start Aggrenox q.h.s.  times ten days and then b.i.d. If her workup is benign, she could be  discharged very soon. Will follow up on the tests.      MLR/MEDQ  D:  03/27/2004  T:  03/27/2004  Job:  [id number]   cc:   Georgann Housekeeper, MD  [address]  [place]  [place] [address]  Fax: [phone]

## 2010-10-16 NOTE — Op Note (Signed)
   NAME:  [name], [name]                           ACCOUNT NO.:  [id number]   MEDICAL RECORD NO.:  [id number]                   PATIENT TYPE:  AMB   LOCATION:  DSC                                  FACILITY:  [facility]   PHYSICIAN:  Abigail Miyamoto, M.D.              DATE OF BIRTH:  [birth year]   DATE OF PROCEDURE:  05/29/2002  DATE OF DISCHARGE:                                 OPERATIVE REPORT   PREOPERATIVE DIAGNOSIS:  Chronic infected sebaceous cyst, left back.   POSTOPERATIVE DIAGNOSIS:  Chronic infected sebaceous cyst, left back.   PROCEDURE:  Excision of chronically infected sebaceous cyst on left back.   SURGEON:  Abigail Miyamoto, M.D.   ANESTHESIA:  1% lidocaine with monitored anesthesia care.   ESTIMATED BLOOD LOSS:  Minimal.   INDICATIONS FOR PROCEDURE:  The patient is a 70 year-old female who has had  a sebaceous cyst on her back which has become infected multiple times  requiring incision and drainage.  Therefore, the decision has been made to  electively excise this area.   PROCEDURE IN DETAIL:  The patient was brought to the operating room and  identified as [facility].  She was placed supine on the operating table and  anesthesia was induced.  The patient was then placed in the right lateral  decubitus position.  The area of skin on the back around the palpable cyst  was anesthetized with 1% lidocaine. An elliptical incision was made around  the cyst and a small opening in the skin with a #15 blade.  The incision was  carried down into the subcutaneous tissue with electrocautery.  A large  ellipse of skin was then obtained down into the soft tissue removing the  cyst in its entirety.  The wound was then thoroughly irrigated with normal  saline.  Hemostasis was achieved with the cautery.  The subcutaneous layer  was then closed with interrupted 2-0 Vicryl sutures and the skin was then  closed with interrupted 3-0 nylon sutures. Gauze was then applied.  The  patient  tolerated the procedure well.  All sponge, needle and instrument  counts were correct at the end of the procedure.  The patient was then taken  in stable condition from the operating room to the recovery room.                                               Abigail Miyamoto, M.D.    DB/MEDQ  D:  05/29/2002  T:  05/29/2002  Job:  [id number]

## 2010-10-16 NOTE — Discharge Summary (Signed)
NAMESIARA, [name]                 ACCOUNT NO.:  [id number]   MEDICAL RECORD NO.:  [id number]          PATIENT TYPE:  INP   LOCATION:  3007                         FACILITY:  [facility]   PHYSICIAN:  Pramod P. Pearlean Brownie, MD    DATE OF BIRTH:  1940/09/08   DATE OF ADMISSION:  03/27/2004  DATE OF DISCHARGE:  03/30/2004                                 DISCHARGE SUMMARY   ADMISSION DIAGNOSIS:  Stroke.   DISCHARGE DIAGNOSES:  1.  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  2.  Diabetes.  3.  Hypertension.   HOSPITAL COURSE:  [name] is a 70 year old pleasant lady who was admitted  with symptoms of sudden onset of dizziness, slurred speech and generalized  weakness.  This happened at work.  Kindly see Dr. Thad Ranger' excellent H&P.   The patient was seen at [facility] emergency room and a CT scan of the head  was unremarkable. She was subsequently transferred to [facility]  where she was admitted to the stroke service for risk stratification workup.   On exam she was found to have mild dysarthria and right facial droop, but no  focal motor deficits, sensory loss or gait abnormalities.   Admission labs were unremarkable.  The EKG revealed no acute abnormalities.  MRI scan of the brain revealed a left pontine small infarction.  MRA of the  __________ circulation was suboptimal due to motion artifact, but revealed  hyperplastic right cortical atrophy.  Carotid ultrasound was technically  difficult due to a high bifurcation, but revealed no significant stenosis.  Transcranial Doppler studies revealed normal velocities, except the left  temporal ___________ limiting the study.  Hemoglobin A-1-C was elevated at  7.5%.  Homocysteine was normal.  Total cholesterol was slightly elevated to  201, triglycerides were 153 and LDL at 124.   The patient was seen in consultation by speech therapy.  She was started on  Aggrenox for secondary stroke prevention and counseling for  diabetes.  She  was found to be stable on the day of discharge and she had only mild facial  asymmetry, but no dysarthria.  She had minimal diminished grip and finger  tapping on the right.  She was recommended to outpatient physical and  occupational therapies.  She was started on Zocor for elevated lipids.  She  was advised to follow up with her primary physician, Dr. Georgann Housekeeper in  two weeks and with Dr. Pearlean Brownie in office in two months.   DISCHARGE MEDICATIONS:  1.  Aggrenox 1 capsule once a day for two weeks to be increased to twice a      day.  2.  Zocor 20 mg a day.  3.  Lisinopril 10 mg a day.  4.  Hydrochlorothiazide 12.5 mg once a day.  5.  Glipizide 5 mg twice a ay.  6.  Metformin 500 mg twice a day.   DIET AND DISCHARGE INSTRUCTIONS:  The patient was advised to go on an 1800-  calorie diabetic low-fat diet. Arrangements were made for patient to have  diabetic counseling and physical and  occupational therapies.       PPS/MEDQ  D:  04/30/2004  T:  05/01/2004  Job:  [id number]   cc:   Georgann Housekeeper, MD  [address]  [place]  [place] [address]  Fax: [phone]

## 2010-10-26 ENCOUNTER — Emergency Department (HOSPITAL_COMMUNITY): Payer: Medicare HMO

## 2010-10-26 ENCOUNTER — Inpatient Hospital Stay (HOSPITAL_COMMUNITY)
Admission: EM | Admit: 2010-10-26 | Discharge: 2010-10-29 | DRG: 065 | Disposition: A | Discharge status: Skilled Nursing Facility | Payer: Medicare HMO | Attending: Internal Medicine | Admitting: Internal Medicine

## 2010-10-26 DIAGNOSIS — E785 Hyperlipidemia, unspecified: Secondary | ICD-10-CM | POA: Diagnosis present

## 2010-10-26 DIAGNOSIS — I69959 Hemiplegia and hemiparesis following unspecified cerebrovascular disease affecting unspecified side: Secondary | ICD-10-CM

## 2010-10-26 DIAGNOSIS — R5381 Other malaise: Secondary | ICD-10-CM | POA: Diagnosis present

## 2010-10-26 DIAGNOSIS — R471 Dysarthria and anarthria: Secondary | ICD-10-CM | POA: Diagnosis present

## 2010-10-26 DIAGNOSIS — E119 Type 2 diabetes mellitus without complications: Secondary | ICD-10-CM | POA: Diagnosis present

## 2010-10-26 DIAGNOSIS — R1310 Dysphagia, unspecified: Secondary | ICD-10-CM | POA: Diagnosis present

## 2010-10-26 DIAGNOSIS — I6350 Cerebral infarction due to unspecified occlusion or stenosis of unspecified cerebral artery: Principal | ICD-10-CM | POA: Diagnosis present

## 2010-10-26 DIAGNOSIS — I10 Essential (primary) hypertension: Secondary | ICD-10-CM | POA: Diagnosis present

## 2010-10-27 ENCOUNTER — Inpatient Hospital Stay (HOSPITAL_COMMUNITY): Payer: Medicare HMO

## 2010-10-27 ENCOUNTER — Encounter (HOSPITAL_COMMUNITY): Payer: Self-pay

## 2010-10-27 LAB — DIFFERENTIAL
Basophils Absolute: 0 10*3/uL (ref 0.0–0.1)
Eosinophils Absolute: 0.1 10*3/uL (ref 0.0–0.7)
Eosinophils Relative: 1 % (ref 0–5)
Lymphocytes Relative: 19 % (ref 12–46)
Lymphs Abs: 1.4 10*3/uL (ref 0.7–4.0)
Neutrophils Relative %: 71 % (ref 43–77)

## 2010-10-27 LAB — CK TOTAL AND CKMB (NOT AT ARMC)
CK, MB: 2.1 ng/mL (ref 0.3–4.0)
Relative Index: 1.1 (ref 0.0–2.5)
Total CK: 186 U/L — ABNORMAL HIGH (ref 7–177)

## 2010-10-27 LAB — CARDIAC PANEL(CRET KIN+CKTOT+MB+TROPI)
CK, MB: 2.7 ng/mL (ref 0.3–4.0)
Relative Index: 0.9 (ref 0.0–2.5)
Relative Index: 1 (ref 0.0–2.5)
Total CK: 276 U/L — ABNORMAL HIGH (ref 7–177)
Troponin I: <0.3 ng/mL (ref <0.30)
Troponin I: <0.3 ng/mL (ref <0.30)

## 2010-10-27 LAB — GLUCOSE, CAPILLARY
Glucose-Capillary: 125 mg/dL — ABNORMAL HIGH (ref 70–99)
Glucose-Capillary: 100 mg/dL — ABNORMAL HIGH (ref 70–99)
Glucose-Capillary: 102 mg/dL — ABNORMAL HIGH (ref 70–99)

## 2010-10-27 LAB — CBC
HCT: 35.9 % — ABNORMAL LOW (ref 36.0–46.0)
HCT: 35.9 % — ABNORMAL LOW (ref 36.0–46.0)
Hemoglobin: 11.8 g/dL — ABNORMAL LOW (ref 12.0–15.0)
MCV: 83.1 fL (ref 78.0–100.0)
MCV: 83.5 fL (ref 78.0–100.0)
Platelets: 255 10*3/uL (ref 150–400)
Platelets: 284 10*3/uL (ref 150–400)
RBC: 4.3 MIL/uL (ref 3.87–5.11)
RBC: 4.32 MIL/uL (ref 3.87–5.11)
RDW: 15.9 % — ABNORMAL HIGH (ref 11.5–15.5)
WBC: 7.5 10*3/uL (ref 4.0–10.5)
WBC: 7.6 10*3/uL (ref 4.0–10.5)

## 2010-10-27 LAB — URINALYSIS, ROUTINE W REFLEX MICROSCOPIC
Bilirubin Urine: NEGATIVE
Glucose, UA: NEGATIVE mg/dL
Hgb urine dipstick: NEGATIVE
Ketones, ur: NEGATIVE mg/dL
Protein, ur: 30 mg/dL — AB
Urobilinogen, UA: 0.2 mg/dL (ref 0.0–1.0)

## 2010-10-27 LAB — APTT: aPTT: 30 seconds (ref 24–37)

## 2010-10-27 LAB — COMPREHENSIVE METABOLIC PANEL
ALT: 17 U/L (ref 0–35)
Calcium: 8.8 mg/dL (ref 8.4–10.5)
GFR calc Af Amer: >60 mL/min (ref >60)
Glucose, Bld: 113 mg/dL — ABNORMAL HIGH (ref 70–99)
Sodium: 139 mEq/L (ref 135–145)
Total Protein: 6 g/dL (ref 6.0–8.3)

## 2010-10-27 LAB — PROTIME-INR: INR: 1.07 (ref 0.00–1.49)

## 2010-10-27 LAB — BASIC METABOLIC PANEL
Calcium: 9.7 mg/dL (ref 8.4–10.5)
Creatinine, Ser: 1.09 mg/dL (ref 0.4–1.2)
GFR calc Af Amer: >60 mL/min (ref >60)
Sodium: 139 mEq/L (ref 135–145)

## 2010-10-27 LAB — LIPID PANEL: Triglycerides: 106 mg/dL (ref <150)

## 2010-10-28 LAB — GLUCOSE, CAPILLARY
Glucose-Capillary: 111 mg/dL — ABNORMAL HIGH (ref 70–99)
Glucose-Capillary: 98 mg/dL (ref 70–99)

## 2010-10-28 NOTE — Consult Note (Signed)
    Electronically Signed by Levie Heritage MD on 10/28/2010 09:24:18 PM

## 2010-10-28 NOTE — Consult Note (Signed)
[name], [name]                 ACCOUNT NO.:  [id number]  MEDICAL RECORD NO.:  [id number]           PATIENT TYPE:  I  LOCATION:  1416                         FACILITY:  [facility]  PHYSICIAN:  Levie Heritage, MD       DATE OF BIRTH:  06-04-1940  DATE OF CONSULTATION:  10/28/2010 DATE OF DISCHARGE:                                CONSULTATION   REFERRING PHYSICIAN:  Georgann Housekeeper, M.D.  REASON FOR CONSULTATION:  Stroke.  CHIEF COMPLAINT:  Sudden worsening of the right-sided weakness.  HISTORY OF PRESENT ILLNESS:  The patient is a 70 year old woman with known history of recent stroke 3 months ago with residual right-sided weakness, who had sudden worsening of the right leg weakness and was found down and confused by her family.  The workup has revealed occlusion of the left ACA with the infarct in the same vessel territory causing her to have more weakness of the right leg as compared to her previous known weakness.  The patient is currently taking her food herself with the left hand and is speaking relatively slowly.  I do not know the baseline of this patient before this, but she denies any other additional neurological deficits as compared to her known previous deficits, but does admit to have additional right leg weakness.  It is important to mention that patient had an embolic stroke in February 2012 and the neurology consult has requested for transesophageal echo, which was not performed because of the patient was not a candidate for Coumadin given multiple falls.  REVIEW OF SYSTEMS:  Denies any chest pain.  Denies any shortness of breath.  Denies any problem with the bowel and bladder control function. Denies any burning urination.  Denies any recent fever.  Denies any recent rash.  Denies any nausea, vomiting, diarrhea.  She does not have any eating difficulty.  Her speaking is a little bit more slurred as compared to her residual deficit from previous stroke.  PAST  MEDICAL HISTORY: 1. History of the stroke with residual right-sided weakness. 2. Hypertension. 3. Hypercholesterolemia. 4. Diabetes mellitus.  SOCIAL HISTORY:  Does not smoke, drink or abuse drugs.  FAMILY HISTORY:  Noncontributory.  ALLERGIES:  No known drug allergies.  MEDICATIONS:  Current list of medication: 1. She is on aspirin and Plavix combination. 2. She takes Prinivil 20 mg twice a day. 3. She takes Diltiazem 240 mg daily basis.  REVIEW OF CLINICAL DATA:  I have reviewed patient's MRI of the brain and have noted the left ACA territory infarct with a complete occlusion of that vessel, which was stenosed in the previous study performed in February 2012.  I have also reviewed her lab workup with upper normal limit of HbA1c at 6.1.  Lipid panel showing LDL of 99.  Unremarkable urinalysis.  Mild anemia on the CBC.  Borderline low potassium on CMP and mild elevation of glucose.  PHYSICAL EXAMINATION:  VITAL SIGNS:  Currently, comfortably lying on the bed with blood pressure of 150/100 mmHg, temperature 98.6, pulse 73 per minute, breathing 20 per minute. GENERAL:  Patient is awake, oriented x2.  No acute  distress, HEENT:  Bilaterally pupils are reactive.  Her exam is somewhat limited with the lack of efforts; however, I do not think there is any obvious field cut on the limited bedside evaluation.  Moves eyes to all direction.  Face seems symmetrical on smile; however, at the baseline can give an impression of mild asymmetry on the right-sided weakness because of the previous stroke probably. MOUTH:  Tongue is midline without atrophy or fasciculation. EXTREMITIES:  Shoulder shrug strength is 5/5. MOTOR EXAMINATION:  Left arm and leg is 5/5 in all the muscle groups. The right arm is 2/5 in strength with some degree of flexion contracture.  The right leg is also 2-3/5 in all the muscle group.  She seems to feel the light touch sensations everywhere.  The gait was deferred.   I do not see any clear aphasia here, but the patient does speak slow.  I do know the baseline, but probably reviewing her EMR, this is likely worsening of her slurring of the speech as the baseline.  IMPRESSION:  [age]-year-old woman with residual right-sided weakness because of recent stroke, comes in with worsening of the right leg weakness and the magnetic resonance imaging shows a stroke in the Left ACA distribution with a complete occlusion of that vessel on MRA.  This time the patient had been on aspirin and Plavix as outpatient.  On her previous stroke in February 2012, she was advised to have transesophageal echocardiogram by neurology consultation; however, it was not pursued because the patient was not a Coumadin candidate due to the multiple falls and risk of bleeding.  PLAN:  I have discussed the patient in detail with Dr. Georgann Housekeeper over the phone and have advised again getting the TEE; however, I also agree that she is not a good candidate for Coumadin either given the fall, therefore, TEE will be on the discretion of the primary team.  I would discontinue the aspirin and Plavix and change it to Aggrenox twice a day basis.  Please continue the Crestor as already doing.  Need the PT/OT evaluation.  No further neurological intervention necessary otherwise at this time because the detail workup was otherwise performed during her previous stroke 3 months ago. At that time it was embolic and this current stroke seem sto be thrombotic occlusion of stenosed Left ACA.  Please call me if you have any questions.          ______________________________ Levie Heritage, MD     WS/MEDQ  D:  10/28/2010  T:  10/28/2010  Job:  [id number]  Electronically Signed by Levie Heritage MD on 10/28/2010 09:23:47 PM

## 2010-11-06 NOTE — Discharge Summary (Signed)
[name], [name]                 ACCOUNT NO.:  [id number]  MEDICAL RECORD NO.:  [id number]           PATIENT TYPE:  I  LOCATION:  1416                         FACILITY:  [facility]  PHYSICIAN:  Georgann Housekeeper, MD      DATE OF BIRTH:  August 18, 1940  DATE OF ADMISSION:  10/26/2010 DATE OF DISCHARGE:  10/29/2010                              DISCHARGE SUMMARY   DISCHARGE DIAGNOSES:  Acute left CVA, ACA territory, history of previous stroke with the left CVA area, right-sided weakness, upper extremity worse than the leg, dysphagia, dysarthria, hypertension, diabetes, dyslipidemia.  MEDICATIONS ON DISCHARGE: 1. Lisinopril 20 mg p.o. b.i.d. 2. Cardizem CD 240 mg p.o. daily. 3. Tylenol 650 mg q.4-6 h. p.r.n. 4. Crestor 5 mg p.o. daily and p.m. 5. NovoLog insulin sliding scale, CBGs q.a.c. 6. Aggrenox 1 capsule p.o. b.i.d. 7. Hydralazine 25 mg p.o. b.i.d., hold for blood pressure less than     110 systolic.  LABORATORY DATA:  CBC showed hemoglobin of 11.8, white count of 7.5. Blood chemistries, creatinine of 0.8, sodium 139, potassium 3.9, glucose of 113.  Cardiac markers were negative.  A1c was 6.1.  CT scan of the head initially was negative for acute finding.  MRI showed small patchy stroke in the left ACA territory without any hemorrhage.  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  Chest x-ray negative.  CONSULTATIONS:  Neurology, Speech Therapy, Physical Therapy.  HOSPITAL COURSE:  A 70 year old female who had history of previous strokes with recent one in about 4 or 5 months ago with right-sided weakness, comes in with episode of weakness and change in mental status.  PROBLEMS: 1. Neurology.  The patient had no significant changes on the right     side with prior weakness of the right upper arm and the leg.  She     had initial CT scan done in the emergency room, which was negative     for acute finding.  She was admitted for observation.  Subsequent     MRI showed small patchy  stroke acute in the left ACA territory with     stenosis of the left ACA.  The patient had some dysphagia.     Swallowing evaluation was done and they recommended using chopped     meats and extra gravy and sauce and aspiration precautions.  The     patient did well with p.o. intake.  She has underlying dysarthria,     which was slightly worsened.  PT/OT was consulted, recommending SNF     rehab care because the patient lives alone, also speech followup.     Neurology was consulted and discussion was done about getting a     TEE, but because the patient is high risk for fall and compliance     living herself, Coumadin was not appropriate at this time.  We held     off on TEE.  The patient was switched from Plavix, aspirin to     Aggrenox b.i.d. for blood thinning and she will continue physical     therapy, speech therapy. 2. Hypertension.  The patient's  blood pressure remained fluctuating     and elevated.  She was continued on lisinopril and Cardizem.  We     will add hydralazine 25 mg b.i.d. for better blood pressure control     to maintain it around 120-130 range. 3. Dyslipidemia.  The patient was on Zocor because of the interaction     with the calcium channel blocker.  It was changed to Crestor 5 mg.     Her lipid profile was under control with LDL of less than 100. 4. Type 2 diabetes.  Because of her poor p.o. intake, her oral     medication was held.  Her blood sugar remained in between 90-110     without the oral medications.  She was maintained on sliding scale     insulin.  Continue monitoring CBG and sliding scale at this time.     If the blood sugars started to increase with increasing p.o.     intake, she may restart her metformin at previous home dose, which     was 1000 mg b.i.d.  The patient previously was on metformin 1000     b.i.d. and Amaryl 2 mg daily, which both were stopped.  At this     point, she will continue on NovoLog sliding scale with the CBGs     before  meals. 5. Dysphagia.  The patient will maintain on the dysphagia precautions     with the assistance with feeding medications with ice cream and     chopped meats with extra gravy.  Check for oral residue.  The     patient will continue with the therapy.  Because the patient lives     alone, the patient will be going to the skilled rehab for therapy     and strengthening.  At this point, she may be a candidate for     assisted living because of her conditions and requiring care and     assistance and I discussed her with her niece.  DIET:  Low carbohydrates with medications with ice cream and chopped meats with extra gravy sauce and check for oral residues.  Aspiration precautions.     Georgann Housekeeper, MD     KH/MEDQ  D:  10/29/2010  T:  10/29/2010  Job:  [id number]  Electronically Signed by Georgann Housekeeper MD on 11/06/2010 08:13:47 AM

## 2010-11-08 NOTE — H&P (Signed)
[name], [name]                 ACCOUNT NO.:  [id number]  MEDICAL RECORD NO.:  [id number]           PATIENT TYPE:  LOCATION:                                 FACILITY:  PHYSICIAN:  Michiel Cowboy, MDDATE OF BIRTH:  [birth year]  DATE OF ADMISSION:  10/27/2010 DATE OF DISCHARGE:                             HISTORY & PHYSICAL   PRIMARY CARE PROVIDER:  Georgann Housekeeper, MD  HISTORY OF PRESENT ILLNESS:  The patient is a 70 year old female with a history of CVA, diabetes, and hypertension.  The patient was at home today.  Her family could not get hold of her all day.  They got worried, they came to her house.  She would not come to the door, so they broke the door down, found her confused.  There was broken air conditioner and it was in the high 90s in the house.  She apparently has not eaten anything all day or drank much fluids.  Initially, the family felt maybe her right side was weaker than usual.  Of note, she has a history of right-sided hemiparesis secondary to a prior stroke and also they thought maybe she has worsening droop on her right side as well, which is old but they were not quite sure.  They did bring her to the emergency department for further evaluation.  In ED, CT scan of her head was unremarkable.  Her workup was unremarkable completely.  But given the fact that she still had difficulty standing up and walking, which is new for her, she is going to get admitted for further evaluation.  It is hard to say if she really does have some deficits.  She does have slightly decreased grip on the right.  I do not know her baseline, but per family they feel like may be slightly worse.  REVIEW OF SYSTEMS:  Otherwise, review of systems no chest pains, no shortness of breath.  Her speech may be slightly more slurred at her baseline, but again this is per her family and they are uncertain about that.  No nausea, vomiting, or diarrhea.  Otherwise, review of systems is  negative.  10-systems reviewed.  PAST MEDICAL HISTORY:  Significant for: 1. History of CVA with residual right-sided hemiparesis. 2. Hypertension. 3. High cholesterol. 4. Diabetes.  SOCIAL HISTORY:  The patient does not smoke, drink, or abuse drugs.  FAMILY HISTORY:  Noncontributory.  ALLERGIES:  None.  MEDICATIONS: 1. Aspirin 325 mg daily. 2. Diltiazem ER 240 mg daily. 3. Glimepiride 2 mg daily. 4. Lisinopril 20 mg b.i.d. 5. Metformin 1000 mg b.i.d. 6. Plavix 75 mg daily. 7. Simvastatin 20 mg daily.  PHYSICAL EXAMINATION:  VITAL SIGNS:  Temperature 98.3, blood pressure 179/63, pulse 56, respirations 18, satting 98% on room air. GENERAL:  The patient appears to be in no acute distress. HEENT:  Head, nontraumatic.  Somewhat dryish mucous membranes with decreased skin turgor. LUNGS:  Clear to auscultation bilaterally. HEART:  Regular rhythm.  No murmurs appreciated. ABDOMEN:  Obese, but nontender and nondistended. LOWER EXTREMITIES:  Without clubbing, cyanosis, or edema. NEUROLOGICALLY:  Strength of grip decreased on the right  lower extremity, appear to both have slightly decreased strength.  I do appreciate the right-sided droop, but per family it is not quite clear if this has worsened or regular one.  Otherwise neurologically, no other neurological abnormality.  She may have a very slight slurred speech.  LABORATORY DATA:  White blood cell count 7.6, hemoglobin 11.8.  Sodium 149, potassium 3.9, creatinine 1.09.  CK 186, otherwise cardiac enzymes negative.  UA unremarkable.  CT scan of the head showing no acute changes, but there is chronic infarcts noted.  Chest x-ray unremarkable. Glucose 117.  EKG showed normal sinus rhythm in 60s with no changes from prior.  ASSESSMENT AND PLAN:  This is a 70 year old female with past history of cerebrovascular accident who was found in an overheated apartment with poor intake of water and food with weakness.  It is unclear if  her weakness has worsened her baseline, but at this point since the family rising the question potentially as symmetric weakness, we will evaluate for transient ischemic attack since her symptoms are improving.  We will check MRI, MRA in the morning.  Recheck a 2-D echo and carotid Dopplers. Give  IV fluids for her blood pressure and blood sugars. 1. Hypertension is accelerated, poorly controlled.  We will continue     her home meds and write for p.r.n. hydralazine for breakthrough     control. 2. Diabetes mellitus.  We will put her on sliding scale.  Hold     metformin.  Hold glimepiride until we are sure she could have good     p.o. intake. 3. History of cerebrovascular accident.  We will continue aspirin and     Plavix. 4. Prophylaxis.  Protonix, SCDs. 5. Code status.  Full code.  I spent about an hour on this admission.     Michiel Cowboy, MD     AVD/MEDQ  D:  10/27/2010  T:  10/27/2010  Job:  [id number]  cc:   Georgann Housekeeper, MD Fax: [phone]  Electronically Signed by Therisa Doyne MD on 11/08/2010 02:41:40 AM

## 2011-03-15 LAB — HEPATIC FUNCTION PANEL
ALT: 28
AST: 24
Albumin: 3.9
Bilirubin, Direct: <0.1

## 2011-03-15 LAB — DIFFERENTIAL
Basophils Absolute: 0
Basophils Relative: 0
Lymphocytes Relative: 28
Monocytes Absolute: 0.7
Neutro Abs: 4.9
Neutrophils Relative %: 62

## 2011-03-15 LAB — TSH: TSH: 0.923

## 2011-03-15 LAB — URINALYSIS, ROUTINE W REFLEX MICROSCOPIC
Bilirubin Urine: NEGATIVE
Specific Gravity, Urine: 1.028
pH: 5.5

## 2011-03-15 LAB — BASIC METABOLIC PANEL
Calcium: 10.2
Creatinine, Ser: 0.88
GFR calc Af Amer: >60
GFR calc non Af Amer: >60
Glucose, Bld: 166 — ABNORMAL HIGH
Sodium: 142

## 2011-03-15 LAB — VITAMIN B12: Vitamin B-12: 556 (ref 211–911)

## 2011-03-15 LAB — CBC
HCT: 38.8
Hemoglobin: 12.9
Hemoglobin: 13.6
MCHC: 33.3
MCV: 82.3
RBC: 4.72
RBC: 4.93
RDW: 14.2 — ABNORMAL HIGH
RDW: 14.6 — ABNORMAL HIGH

## 2011-03-15 LAB — URINE MICROSCOPIC-ADD ON

## 2011-03-15 LAB — LIPID PANEL
Cholesterol: 248 — ABNORMAL HIGH
HDL: 49
Total CHOL/HDL Ratio: 5.1
VLDL: 24

## 2011-03-15 LAB — HEMOGLOBIN A1C: Hgb A1c MFr Bld: 9.6 — ABNORMAL HIGH

## 2011-04-13 ENCOUNTER — Ambulatory Visit: Payer: Medicare HMO | Attending: Internal Medicine | Admitting: Rehabilitative and Restorative Service Providers"

## 2011-04-13 DIAGNOSIS — M2569 Stiffness of other specified joint, not elsewhere classified: Secondary | ICD-10-CM | POA: Insufficient documentation

## 2011-04-13 DIAGNOSIS — I69998 Other sequelae following unspecified cerebrovascular disease: Secondary | ICD-10-CM | POA: Insufficient documentation

## 2011-04-13 DIAGNOSIS — IMO0001 Reserved for inherently not codable concepts without codable children: Secondary | ICD-10-CM | POA: Insufficient documentation

## 2011-04-21 ENCOUNTER — Ambulatory Visit: Payer: Medicare HMO | Admitting: Physical Therapy

## 2011-04-27 ENCOUNTER — Ambulatory Visit: Payer: Medicare HMO | Admitting: Rehabilitative and Restorative Service Providers"

## 2011-04-30 ENCOUNTER — Ambulatory Visit: Payer: Medicare HMO | Admitting: Physical Therapy

## 2011-05-03 ENCOUNTER — Ambulatory Visit: Payer: Medicare HMO | Attending: Internal Medicine | Admitting: Physical Therapy

## 2011-05-03 DIAGNOSIS — IMO0001 Reserved for inherently not codable concepts without codable children: Secondary | ICD-10-CM | POA: Insufficient documentation

## 2011-05-03 DIAGNOSIS — I69998 Other sequelae following unspecified cerebrovascular disease: Secondary | ICD-10-CM | POA: Insufficient documentation

## 2011-05-03 DIAGNOSIS — M2569 Stiffness of other specified joint, not elsewhere classified: Secondary | ICD-10-CM | POA: Insufficient documentation

## 2011-05-06 ENCOUNTER — Ambulatory Visit: Payer: Medicare HMO | Admitting: Rehabilitative and Restorative Service Providers"

## 2011-05-06 ENCOUNTER — Ambulatory Visit: Payer: Medicare HMO | Admitting: Physical Therapy

## 2011-05-10 ENCOUNTER — Ambulatory Visit: Payer: Medicare HMO | Admitting: Rehabilitative and Restorative Service Providers"

## 2011-05-13 ENCOUNTER — Ambulatory Visit: Payer: Medicare HMO | Admitting: Physical Therapy

## 2011-05-18 ENCOUNTER — Ambulatory Visit: Payer: Medicare HMO | Admitting: Rehabilitative and Restorative Service Providers"

## 2011-05-20 ENCOUNTER — Ambulatory Visit: Payer: Medicare HMO | Admitting: Physical Therapy

## 2011-05-21 ENCOUNTER — Ambulatory Visit: Payer: Medicare HMO | Admitting: Rehabilitative and Restorative Service Providers"

## 2011-05-27 ENCOUNTER — Ambulatory Visit: Payer: Medicare HMO | Admitting: Occupational Therapy

## 2011-05-27 ENCOUNTER — Ambulatory Visit: Payer: Medicare HMO | Admitting: Physical Therapy

## 2011-06-03 ENCOUNTER — Ambulatory Visit: Payer: Medicare HMO | Attending: Internal Medicine | Admitting: Physical Therapy

## 2011-06-03 DIAGNOSIS — M2569 Stiffness of other specified joint, not elsewhere classified: Secondary | ICD-10-CM | POA: Insufficient documentation

## 2011-06-03 DIAGNOSIS — IMO0001 Reserved for inherently not codable concepts without codable children: Secondary | ICD-10-CM | POA: Insufficient documentation

## 2011-06-03 DIAGNOSIS — I69998 Other sequelae following unspecified cerebrovascular disease: Secondary | ICD-10-CM | POA: Insufficient documentation

## 2011-06-07 ENCOUNTER — Ambulatory Visit: Payer: Medicare HMO | Admitting: Rehabilitative and Restorative Service Providers"

## 2011-06-07 ENCOUNTER — Ambulatory Visit: Payer: Medicare HMO | Admitting: *Deleted

## 2011-06-09 ENCOUNTER — Ambulatory Visit: Payer: Medicare HMO | Admitting: Occupational Therapy

## 2011-06-09 ENCOUNTER — Ambulatory Visit: Payer: Medicare HMO | Admitting: Physical Therapy

## 2011-06-16 ENCOUNTER — Ambulatory Visit: Payer: Medicare HMO | Admitting: Occupational Therapy

## 2011-06-16 ENCOUNTER — Ambulatory Visit: Payer: Medicare HMO | Admitting: Physical Therapy

## 2011-06-18 ENCOUNTER — Encounter: Payer: Medicare HMO | Admitting: Occupational Therapy

## 2011-06-18 ENCOUNTER — Ambulatory Visit: Payer: Medicare HMO | Admitting: Rehabilitative and Restorative Service Providers"

## 2011-06-22 ENCOUNTER — Ambulatory Visit: Payer: Medicare HMO | Admitting: Rehabilitative and Restorative Service Providers"

## 2011-06-22 ENCOUNTER — Ambulatory Visit: Payer: Medicare HMO | Admitting: Occupational Therapy

## 2011-06-24 ENCOUNTER — Ambulatory Visit: Payer: Medicare HMO | Admitting: Occupational Therapy

## 2011-06-24 ENCOUNTER — Ambulatory Visit: Payer: Medicare HMO | Admitting: Physical Therapy

## 2011-06-29 ENCOUNTER — Ambulatory Visit: Payer: Medicare HMO | Admitting: Occupational Therapy

## 2011-06-29 ENCOUNTER — Ambulatory Visit: Payer: Medicare HMO | Admitting: Rehabilitative and Restorative Service Providers"

## 2011-07-02 ENCOUNTER — Ambulatory Visit: Payer: Medicare HMO | Admitting: Rehabilitative and Restorative Service Providers"

## 2011-07-02 ENCOUNTER — Ambulatory Visit: Payer: Medicare HMO | Attending: Internal Medicine | Admitting: Occupational Therapy

## 2011-07-02 DIAGNOSIS — I69998 Other sequelae following unspecified cerebrovascular disease: Secondary | ICD-10-CM | POA: Insufficient documentation

## 2011-07-02 DIAGNOSIS — M2569 Stiffness of other specified joint, not elsewhere classified: Secondary | ICD-10-CM | POA: Insufficient documentation

## 2011-07-02 DIAGNOSIS — IMO0001 Reserved for inherently not codable concepts without codable children: Secondary | ICD-10-CM | POA: Insufficient documentation

## 2011-07-14 ENCOUNTER — Other Ambulatory Visit: Payer: Self-pay | Admitting: Internal Medicine

## 2011-07-14 DIAGNOSIS — Z1231 Encounter for screening mammogram for malignant neoplasm of breast: Secondary | ICD-10-CM

## 2011-07-30 ENCOUNTER — Ambulatory Visit
Admission: RE | Admit: 2011-07-30 | Discharge: 2011-07-30 | Disposition: A | Discharge status: Home / Self Care | Payer: Medicare HMO | Source: Ambulatory Visit | Attending: Internal Medicine | Admitting: Internal Medicine

## 2011-07-30 DIAGNOSIS — Z1231 Encounter for screening mammogram for malignant neoplasm of breast: Secondary | ICD-10-CM

## 2011-10-22 ENCOUNTER — Emergency Department (HOSPITAL_COMMUNITY): Payer: Medicare HMO

## 2011-10-22 ENCOUNTER — Encounter (HOSPITAL_COMMUNITY): Payer: Self-pay | Admitting: Emergency Medicine

## 2011-10-22 ENCOUNTER — Observation Stay (HOSPITAL_COMMUNITY)
Admission: EM | Admit: 2011-10-22 | Discharge: 2011-10-25 | Disposition: A | Discharge status: Home / Self Care | Payer: Medicare HMO | Attending: Internal Medicine | Admitting: Internal Medicine

## 2011-10-22 DIAGNOSIS — R410 Disorientation, unspecified: Secondary | ICD-10-CM

## 2011-10-22 DIAGNOSIS — N390 Urinary tract infection, site not specified: Secondary | ICD-10-CM | POA: Clinically undetermined

## 2011-10-22 DIAGNOSIS — Z8673 Personal history of transient ischemic attack (TIA), and cerebral infarction without residual deficits: Secondary | ICD-10-CM

## 2011-10-22 DIAGNOSIS — I639 Cerebral infarction, unspecified: Secondary | ICD-10-CM | POA: Diagnosis present

## 2011-10-22 DIAGNOSIS — R4789 Other speech disturbances: Secondary | ICD-10-CM | POA: Insufficient documentation

## 2011-10-22 DIAGNOSIS — R29898 Other symptoms and signs involving the musculoskeletal system: Secondary | ICD-10-CM | POA: Insufficient documentation

## 2011-10-22 DIAGNOSIS — E860 Dehydration: Secondary | ICD-10-CM | POA: Diagnosis present

## 2011-10-22 DIAGNOSIS — I10 Essential (primary) hypertension: Secondary | ICD-10-CM

## 2011-10-22 DIAGNOSIS — I69998 Other sequelae following unspecified cerebrovascular disease: Secondary | ICD-10-CM | POA: Insufficient documentation

## 2011-10-22 DIAGNOSIS — E119 Type 2 diabetes mellitus without complications: Secondary | ICD-10-CM

## 2011-10-22 DIAGNOSIS — R55 Syncope and collapse: Principal | ICD-10-CM | POA: Diagnosis present

## 2011-10-22 HISTORY — DX: Pure hypercholesterolemia, unspecified: E78.00

## 2011-10-22 HISTORY — DX: Cerebral infarction, unspecified: I63.9

## 2011-10-22 HISTORY — DX: Essential (primary) hypertension: I10

## 2011-10-22 LAB — DIFFERENTIAL
Basophils Relative: 0 % (ref 0–1)
Lymphocytes Relative: 20 % (ref 12–46)
Lymphs Abs: 1.7 10*3/uL (ref 0.7–4.0)
Monocytes Absolute: 0.7 10*3/uL (ref 0.1–1.0)
Monocytes Relative: 8 % (ref 3–12)
Neutro Abs: 6.2 10*3/uL (ref 1.7–7.7)
Neutrophils Relative %: 71 % (ref 43–77)

## 2011-10-22 LAB — COMPREHENSIVE METABOLIC PANEL
ALT: 20 U/L (ref 0–35)
Calcium: 9.5 mg/dL (ref 8.4–10.5)
Creatinine, Ser: 1.05 mg/dL (ref 0.50–1.10)
GFR calc Af Amer: 61 mL/min — ABNORMAL LOW (ref >90)
Glucose, Bld: 124 mg/dL — ABNORMAL HIGH (ref 70–99)
Sodium: 143 mEq/L (ref 135–145)
Total Protein: 6.9 g/dL (ref 6.0–8.3)

## 2011-10-22 LAB — CBC
HCT: 34.9 % — ABNORMAL LOW (ref 36.0–46.0)
Hemoglobin: 11.6 g/dL — ABNORMAL LOW (ref 12.0–15.0)
MCHC: 33.2 g/dL (ref 30.0–36.0)
RBC: 4.08 MIL/uL (ref 3.87–5.11)

## 2011-10-22 NOTE — ED Notes (Addendum)
PT talking with friends and LOC for 1 minute. Then patient regain consciousness. No neuro deficits after. Previous hx TIA; pt is baseline per family. PT reports she is on antibiotics for cold; afebrile; A&OX3; EMS CBG 175.

## 2011-10-22 NOTE — ED Provider Notes (Signed)
History     CSN: [id number]  Arrival date & time 10/22/11  1945   First MD Initiated Contact with Patient 10/22/11 1954      Chief Complaint  Patient presents with  . Loss of Consciousness    (Consider location/radiation/quality/duration/timing/severity/associated sxs/prior treatment) HPI Comments: Pt has had recent viral URI symptoms.  Otherwise doing well at home.  Sitting at table tonight and "fell asleep" per the family.  Was unable to be aroused by family for several minutes then woke up but seemed sluggish.  Pt has no complaints in ED but family feels she is sluggish compared to her baseline.  Patient is a 71 y.o. female presenting with syncope. The history is provided by the patient and a relative.  Loss of Consciousness This is a new problem. The current episode started today. The problem occurs rarely. The problem has been resolved. Pertinent negatives include no abdominal pain, chest pain, congestion, coughing, fever, nausea, rash, vertigo, visual change, vomiting or weakness. The symptoms are aggravated by nothing. She has tried nothing for the symptoms.    Past Medical History  Diagnosis Date  . Stroke   . Hypertension   . Hypercholesteremia   . Diabetes mellitus     No past surgical history on file.  No family history on file.  History  Substance Use Topics  . Smoking status: Not on file  . Smokeless tobacco: Not on file  . Alcohol Use:     OB History    Grav Para Term Preterm Abortions TAB SAB Ect Mult Living                  Review of Systems  Constitutional: Negative for fever and activity change.  HENT: Negative for congestion.   Eyes: Negative for visual disturbance.  Respiratory: Negative for cough, chest tightness and shortness of breath.   Cardiovascular: Positive for syncope. Negative for chest pain and leg swelling.  Gastrointestinal: Negative for nausea, vomiting and abdominal pain.  Genitourinary: Negative for dysuria.  Skin: Negative  for rash.  Neurological: Negative for vertigo, syncope and weakness.  Psychiatric/Behavioral: Negative for behavioral problems.    Allergies  Review of patient's allergies indicates no known allergies.  Home Medications   Current Outpatient Rx  Name Route Sig Dispense Refill  . AZITHROMYCIN 250 MG PO TABS Oral Take 250-500 mg by mouth daily. Take 2 tabs the first day then 1 tab daily for the next 4 days    . BISACODYL 5 MG PO TBEC Oral Take 5 mg by mouth daily as needed. For constipation    . CLONIDINE HCL 0.1 MG/24HR TD PTWK Transdermal Place 1 patch onto the skin once a week.    Marland Kitchen DILTIAZEM HCL ER 240 MG PO CP24 Oral Take 240 mg by mouth daily.    . ASPIRIN-DIPYRIDAMOLE ER 25-200 MG PO CP12 Oral Take 1 capsule by mouth 2 (two) times daily.    Marland Kitchen HYDRALAZINE HCL 25 MG PO TABS Oral Take 25 mg by mouth 2 (two) times daily.    Marland Kitchen LISINOPRIL 20 MG PO TABS Oral Take 20 mg by mouth 2 (two) times daily.    Marland Kitchen ROSUVASTATIN CALCIUM 5 MG PO TABS Oral Take 5 mg by mouth daily.      BP 161/63  Pulse 55  Temp(Src) 98.7 F (37.1 C) (Oral)  Resp 15  SpO2 97%  Physical Exam  Constitutional: She appears well-developed and well-nourished.  HENT:  Head: Normocephalic and atraumatic.  Eyes: Conjunctivae and EOM  are normal. Pupils are equal, round, and reactive to light. No scleral icterus.  Neck: Normal range of motion. Neck supple.  Cardiovascular: Normal rate and regular rhythm.  Exam reveals no gallop and no friction rub.   No murmur heard. Pulmonary/Chest: Effort normal and breath sounds normal. No respiratory distress. She has no wheezes. She has no rales. She exhibits no tenderness.  Abdominal: Soft. She exhibits no distension and no mass. There is no tenderness. There is no rebound and no guarding.  Musculoskeletal: Normal range of motion.  Neurological: She is alert. She has normal reflexes. No cranial nerve deficit. She exhibits normal muscle tone. Coordination normal.       Oriented to  self, place.  5/5 strength in all extremities including RUE.  Skin: Skin is warm and dry. No rash noted.  Psychiatric: She has a normal mood and affect. Her behavior is normal. Judgment and thought content normal.    ED Course  Procedures (including critical care time)   Date: 10/22/2011  Rate: 67  Rhythm: normal sinus rhythm  QRS Axis: normal  Intervals: normal  ST/T Wave abnormalities: nonspecific T wave changes  Conduction Disutrbances:none  Narrative Interpretation:   Old EKG Reviewed: unchanged    Labs Reviewed  COMPREHENSIVE METABOLIC PANEL - Abnormal; Notable for the following:    Glucose, Bld 124 (*)    BUN 28 (*)    Albumin 3.3 (*)    Total Bilirubin 0.1 (*)    GFR calc non Af Amer 53 (*)    GFR calc Af Amer 61 (*)    All other components within normal limits  CBC - Abnormal; Notable for the following:    Hemoglobin 11.6 (*)    HCT 34.9 (*)    All other components within normal limits  DIFFERENTIAL  TROPONIN I  URINALYSIS, ROUTINE W REFLEX MICROSCOPIC   Dg Chest 2 View  10/22/2011  *RADIOLOGY REPORT*  Clinical Data: Loss of consciousness.  History of hypertension, diabetes, stroke, nonsmoker.  CHEST - 2 VIEW  Comparison: 10/26/2010  Findings: Shallow inspiration.  Mild prominence of heart size and pulmonary vascularity is likely normal for technique.  There is linear atelectasis in the left lung base.  No focal consolidation. No blunting of costophrenic angles.  No pneumothorax.  Degenerative changes in the thoracic spine.  IMPRESSION: Shallow inspiration with linear atelectasis in the left lung base.  Original Report Authenticated By: Marlon Pel, M.D.   Ct Head Wo Contrast  10/22/2011  *RADIOLOGY REPORT*  Clinical Data: Spontaneous loss of consciousness.  History of previous TIAs.  CT HEAD WITHOUT CONTRAST  Technique:  Contiguous axial images were obtained from the base of the skull through the vertex without contrast.  Comparison: MRI brain 10/27/2010.  CT  head 10/27/2010.  Findings: Diffuse cerebral atrophy.  Low attenuation changes throughout the deep white matter consistent with small vessel ischemia.  Old lacunar infarcts in the basal ganglia and thalami bilaterally.  Ventricular dilatation consistent with central atrophy.  No mass effect or midline shift.  No abnormal extra-axial fluid collections.  Gray-white matter junctions are distinct. Basal cisterns are not effaced.  No evidence of acute intracranial hemorrhage.  No depressed skull fractures.  Vascular calcifications.  Visualized paranasal sinuses and mastoid air cells are not opacified.  No significant changes since the previous study.  IMPRESSION: No acute intracranial abnormalities.  Chronic atrophy and small vessel ischemic changes.  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  Original Report Authenticated By: Marlon Pel, M.D.  1. Syncope   2. Confusion       MDM  Pt has had recent viral URI symptoms.  Otherwise doing well at home.  Sitting at table tonight and "fell asleep" per the family.  Was unable to be aroused by family for several minutes then woke up but seemed sluggish.  Pt has no complaints in ED but family feels she is sluggish compared to her baseline.  VSS and well appearing.  Nonfocal neuro exam.  Head CT, EKG, labs unconcerning.  Remains well appearing in ED.  Concern for cardiogenic syncope.  No hx to suggest TIA, stroke, seizure.   D/w hospitalist - will admit for further management.        Army Chaco, MD 10/22/11 2351

## 2011-10-22 NOTE — ED Notes (Signed)
Patient transported to CT 

## 2011-10-22 NOTE — ED Provider Notes (Signed)
I saw and evaluated the patient, reviewed the resident's note and I agree with the findings and plan.  Pt seen and evaluated- pt s/p syncopal episode and has continued to have some sluggishness and mild confusion.  Pt admitted to medical service for further workup and management  EKG reviewed and agree with the findings as documented by the resident  Ethelda Chick, MD 10/22/11 [id number]

## 2011-10-22 NOTE — ED Notes (Signed)
MD at bedside. 

## 2011-10-23 ENCOUNTER — Encounter (HOSPITAL_COMMUNITY): Payer: Self-pay | Admitting: Family Medicine

## 2011-10-23 ENCOUNTER — Observation Stay (HOSPITAL_COMMUNITY): Payer: Medicare HMO

## 2011-10-23 DIAGNOSIS — E119 Type 2 diabetes mellitus without complications: Secondary | ICD-10-CM

## 2011-10-23 DIAGNOSIS — Z8673 Personal history of transient ischemic attack (TIA), and cerebral infarction without residual deficits: Secondary | ICD-10-CM

## 2011-10-23 DIAGNOSIS — I369 Nonrheumatic tricuspid valve disorder, unspecified: Secondary | ICD-10-CM

## 2011-10-23 DIAGNOSIS — I10 Essential (primary) hypertension: Secondary | ICD-10-CM

## 2011-10-23 DIAGNOSIS — R55 Syncope and collapse: Secondary | ICD-10-CM | POA: Diagnosis present

## 2011-10-23 DIAGNOSIS — I6359 Cerebral infarction due to unspecified occlusion or stenosis of other cerebral artery: Secondary | ICD-10-CM

## 2011-10-23 DIAGNOSIS — E1165 Type 2 diabetes mellitus with hyperglycemia: Secondary | ICD-10-CM

## 2011-10-23 LAB — URINALYSIS, ROUTINE W REFLEX MICROSCOPIC
Glucose, UA: NEGATIVE mg/dL
Specific Gravity, Urine: >1.03 — ABNORMAL HIGH (ref 1.005–1.030)
pH: 5.5 (ref 5.0–8.0)

## 2011-10-23 LAB — CBC
HCT: 33.6 % — ABNORMAL LOW (ref 36.0–46.0)
Hemoglobin: 11.2 g/dL — ABNORMAL LOW (ref 12.0–15.0)
MCH: 28.8 pg (ref 26.0–34.0)
MCHC: 33.3 g/dL (ref 30.0–36.0)
MCV: 86.4 fL (ref 78.0–100.0)
WBC: 6.6 10*3/uL (ref 4.0–10.5)

## 2011-10-23 LAB — URINE MICROSCOPIC-ADD ON

## 2011-10-23 LAB — GLUCOSE, CAPILLARY: Glucose-Capillary: 90 mg/dL (ref 70–99)

## 2011-10-23 LAB — BASIC METABOLIC PANEL
Calcium: 9.1 mg/dL (ref 8.4–10.5)
Creatinine, Ser: 0.87 mg/dL (ref 0.50–1.10)
GFR calc Af Amer: 77 mL/min — ABNORMAL LOW (ref >90)
GFR calc non Af Amer: 66 mL/min — ABNORMAL LOW (ref >90)

## 2011-10-23 LAB — HEMOGLOBIN A1C: Hgb A1c MFr Bld: 6 % — ABNORMAL HIGH (ref <5.7)

## 2011-10-23 LAB — LIPID PANEL
LDL Cholesterol: 54 mg/dL (ref 0–99)
Total CHOL/HDL Ratio: 2 RATIO
VLDL: 11 mg/dL (ref 0–40)

## 2011-10-23 LAB — D-DIMER, QUANTITATIVE: D-Dimer, Quant: <0.22 ug/mL-FEU (ref 0.00–0.48)

## 2011-10-23 MED ORDER — HYDRALAZINE HCL 25 MG PO TABS
25.0000 mg | ORAL_TABLET | Freq: Two times a day (BID) | ORAL | Status: DC
  Administered 2011-10-23 – 2011-10-24 (×4): 25 mg via ORAL
  Filled 2011-10-23 (×5): qty 1

## 2011-10-23 MED ORDER — SODIUM CHLORIDE 0.9 % IJ SOLN
3.0000 mL | Freq: Two times a day (BID) | INTRAMUSCULAR | Status: DC
  Administered 2011-10-23 – 2011-10-25 (×5): 3 mL via INTRAVENOUS

## 2011-10-23 MED ORDER — ACETAMINOPHEN 650 MG RE SUPP: 650.0000 mg | Freq: Four times a day (QID) | RECTAL | Status: DC | PRN

## 2011-10-23 MED ORDER — POTASSIUM CHLORIDE IN NACL 20-0.9 MEQ/L-% IV SOLN
INTRAVENOUS | Status: DC
  Administered 2011-10-23 (×2): via INTRAVENOUS
  Filled 2011-10-23 (×4): qty 1000

## 2011-10-23 MED ORDER — CLONIDINE HCL 0.1 MG/24HR TD PTWK
0.1000 mg | MEDICATED_PATCH | TRANSDERMAL | Status: DC
Start: 2011-10-25 — End: 2011-10-25
  Filled 2011-10-23: qty 1

## 2011-10-23 MED ORDER — CIPROFLOXACIN HCL 500 MG PO TABS
500.0000 mg | ORAL_TABLET | Freq: Two times a day (BID) | ORAL | Status: DC
  Administered 2011-10-23 – 2011-10-25 (×5): 500 mg via ORAL
  Filled 2011-10-23 (×8): qty 1

## 2011-10-23 MED ORDER — ATORVASTATIN CALCIUM 20 MG PO TABS
20.0000 mg | ORAL_TABLET | Freq: Every day | ORAL | Status: DC
  Administered 2011-10-23 – 2011-10-24 (×2): 20 mg via ORAL
  Filled 2011-10-23 (×3): qty 1

## 2011-10-23 MED ORDER — LISINOPRIL 20 MG PO TABS
20.0000 mg | ORAL_TABLET | Freq: Two times a day (BID) | ORAL | Status: DC
  Administered 2011-10-23 – 2011-10-25 (×6): 20 mg via ORAL
  Filled 2011-10-23 (×7): qty 1

## 2011-10-23 MED ORDER — GADOBENATE DIMEGLUMINE 529 MG/ML IV SOLN
15.0000 mL | Freq: Once | INTRAVENOUS | Status: AC | PRN
  Administered 2011-10-23: 15 mL via INTRAVENOUS

## 2011-10-23 MED ORDER — ALUM & MAG HYDROXIDE-SIMETH 200-200-20 MG/5ML PO SUSP: 30.0000 mL | Freq: Four times a day (QID) | ORAL | Status: DC | PRN

## 2011-10-23 MED ORDER — DILTIAZEM HCL ER 240 MG PO CP24
240.0000 mg | ORAL_CAPSULE | Freq: Every day | ORAL | Status: DC
  Administered 2011-10-23 – 2011-10-25 (×3): 240 mg via ORAL
  Filled 2011-10-23 (×3): qty 1

## 2011-10-23 MED ORDER — BISACODYL 5 MG PO TBEC
5.0000 mg | DELAYED_RELEASE_TABLET | Freq: Every day | ORAL | Status: DC | PRN
  Administered 2011-10-23: 5 mg via ORAL
  Filled 2011-10-23: qty 1

## 2011-10-23 MED ORDER — PNEUMOCOCCAL VAC POLYVALENT 25 MCG/0.5ML IJ INJ
0.5000 mL | INJECTION | INTRAMUSCULAR | Status: AC
  Filled 2011-10-23: qty 0.5

## 2011-10-23 MED ORDER — ONDANSETRON HCL 4 MG/2ML IJ SOLN: 4.0000 mg | Freq: Four times a day (QID) | INTRAMUSCULAR | Status: DC | PRN

## 2011-10-23 MED ORDER — ENOXAPARIN SODIUM 40 MG/0.4ML ~~LOC~~ SOLN
40.0000 mg | SUBCUTANEOUS | Status: DC
  Administered 2011-10-23 – 2011-10-24 (×2): 40 mg via SUBCUTANEOUS
  Filled 2011-10-23 (×3): qty 0.4

## 2011-10-23 MED ORDER — ASPIRIN-DIPYRIDAMOLE ER 25-200 MG PO CP12
1.0000 | ORAL_CAPSULE | Freq: Two times a day (BID) | ORAL | Status: DC
  Administered 2011-10-23 – 2011-10-25 (×6): 1 via ORAL
  Filled 2011-10-23 (×7): qty 1

## 2011-10-23 MED ORDER — ONDANSETRON HCL 4 MG PO TABS: 4.0000 mg | ORAL_TABLET | Freq: Four times a day (QID) | ORAL | Status: DC | PRN

## 2011-10-23 MED ORDER — HYDROCODONE-ACETAMINOPHEN 5-325 MG PO TABS
1.0000 | ORAL_TABLET | ORAL | Status: DC | PRN
  Filled 2011-10-23: qty 1

## 2011-10-23 MED ORDER — ACETAMINOPHEN 325 MG PO TABS: 650.0000 mg | ORAL_TABLET | Freq: Four times a day (QID) | ORAL | Status: DC | PRN

## 2011-10-23 NOTE — Progress Notes (Signed)
10/23/11 On call NP notified that patient heart rate had dropped to 47 bpm non-sustained. She was A/O x 3 in the bed and was asymptomatic. Patient's baseline heart rate has been bradycardic in the 50's. Parameters added for cardizem po and was told if it happens and again and the patient is experiencing symptoms to call and notify on call NP again.

## 2011-10-23 NOTE — Progress Notes (Signed)
10/23/11 Patient was brought up to the unit from the ED around 0130. She was transported by stretcher A/O x 3,  on room air with, with IV normal saline locked. She had no complaints of pain. She was able to transfer from stretcher to bed with two person assist. Patient has past medical history of CVA with residual of right sided weakness and slurred speech.

## 2011-10-23 NOTE — Progress Notes (Signed)
  Echocardiogram 2D Echocardiogram has been performed.  Laural Benes, Daveyon Kitchings A 10/23/2011, 1:25 PM

## 2011-10-23 NOTE — Progress Notes (Signed)
Subjective: No new issues this morning.  Objective: Vital signs in last 24 hours: Temp:  [98.2 F (36.8 C)-98.7 F (37.1 C)] 98.5 F (36.9 C) (05/25 4540) Pulse Rate:  [52-69] 58  (05/25 0633) Resp:  [15-24] 18  (05/25 0633) BP: (133-166)/(45-71) 133/48 mmHg (05/25 0633) SpO2:  [95 %-99 %] 97 % (05/25 0633) Weight:  [69.9 kg (154 lb 1.6 oz)] 69.9 kg (154 lb 1.6 oz) (05/25 0100) Weight change:  Last BM Date:  (prior to admission)  Intake/Output from previous day: 05/24 0701 - 05/25 0700 In: 500 [P.O.:120; I.V.:380] Out: -      Physical Exam: General: Comfortable, alert, communicative, fully oriented, not short of breath at rest, appears to have mild expressive dysphasia.  HEENT:  No clinical pallor, no jaundice, no conjunctival injection or discharge. Hydration status is fair. NECK:  Supple, JVP not seen, no carotid bruits, no palpable lymphadenopathy, no palpable goiter. CHEST:  Clinically clear to auscultation, no wheezes, no crackles. HEART:  Sounds 1 and 2 heard, normal, regular, soft systolic murmur at apex. ABDOMEN:  Full, soft, non-tender, no palpable organomegaly, no palpable masses, normal bowel sounds. GENITALIA:  Not examined. LOWER EXTREMITIES:  No pitting edema, palpable peripheral pulses. MUSCULOSKELETAL SYSTEM:  Generalized osteoarthritic changes, otherwise, normal. CENTRAL NERVOUS SYSTEM:  Residual mild right hemiparesis..  Lab Results:  Basename 10/23/11 0640 10/22/11 2136  WBC 6.6 8.6  HGB 11.2* 11.6*  HCT 33.6* 34.9*  PLT 233 234    Basename 10/23/11 0640 10/22/11 2136  NA 142 143  K 3.8 4.0  CL 110 109  CO2 22 26  GLUCOSE 81 124*  BUN 23 28*  CREATININE 0.87 1.05  CALCIUM 9.1 9.5   No results found for this or any previous visit (from the past 240 hour(s)).   Studies/Results: Dg Chest 2 View  10/22/2011  *RADIOLOGY REPORT*  Clinical Data: Loss of consciousness.  History of hypertension, diabetes, stroke, nonsmoker.  CHEST - 2 VIEW   Comparison: 10/26/2010  Findings: Shallow inspiration.  Mild prominence of heart size and pulmonary vascularity is likely normal for technique.  There is linear atelectasis in the left lung base.  No focal consolidation. No blunting of costophrenic angles.  No pneumothorax.  Degenerative changes in the thoracic spine.  IMPRESSION: Shallow inspiration with linear atelectasis in the left lung base.  Original Report Authenticated By: Marlon Pel, M.D.   Ct Head Wo Contrast  10/22/2011  *RADIOLOGY REPORT*  Clinical Data: Spontaneous loss of consciousness.  History of previous TIAs.  CT HEAD WITHOUT CONTRAST  Technique:  Contiguous axial images were obtained from the base of the skull through the vertex without contrast.  Comparison: MRI brain 10/27/2010.  CT head 10/27/2010.  Findings: Diffuse cerebral atrophy.  Low attenuation changes throughout the deep white matter consistent with small vessel ischemia.  Old lacunar infarcts in the basal ganglia and thalami bilaterally.  Ventricular dilatation consistent with central atrophy.  No mass effect or midline shift.  No abnormal extra-axial fluid collections.  Gray-white matter junctions are distinct. Basal cisterns are not effaced.  No evidence of acute intracranial hemorrhage.  No depressed skull fractures.  Vascular calcifications.  Visualized paranasal sinuses and mastoid air cells are not opacified.  No significant changes since the previous study.  IMPRESSION: No acute intracranial abnormalities.  Chronic atrophy and small vessel ischemic changes.  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  Original Report Authenticated By: Marlon Pel, M.D.    Medications: Scheduled Meds:   . atorvastatin  20 mg  Oral q1800  . cloNIDine  0.1 mg Transdermal Weekly  . diltiazem  240 mg Oral Daily  . dipyridamole-aspirin  1 capsule Oral BID  . enoxaparin  40 mg Subcutaneous Q24H  . hydrALAZINE  25 mg Oral BID  . lisinopril  20 mg Oral BID  . pneumococcal 23 valent  vaccine  0.5 mL Intramuscular Tomorrow-1000  . sodium chloride  3 mL Intravenous Q12H   Continuous Infusions:   . 0.9 % NaCl with KCl 20 mEq / L 75 mL/hr at 10/23/11 0156   PRN Meds:.acetaminophen, acetaminophen, alum & mag hydroxide-simeth, bisacodyl, HYDROcodone-acetaminophen, ondansetron (ZOFRAN) IV, ondansetron  Assessment/Plan:  Principal Problem:  *Syncope: Etiology is unclear at this time, but may be vaso-vagal. Patient however, did have an elevated BUN/creatinine ratio, consistent with at least mild dehydration, which may have been contributory. Seizure disorder, is also a consideration, given known history of cerebrovascular disease. Head Ct scan showed no acute intracranial abnormalities, chronic atrophy and small vessel ischemic changes, multiple old lacunar infarcts. Work up is in progress, including brain MRI/MRA, Carotid/Vertebral artery duplex, 2D Echocardiogram. Have ordered EEG, and sent off D-Dimer. EKG shows sinus rhythm, and telemetry has revealed no arrhythmias. Active Problems: 1. Mild dehydration: BUN was 28, with creatinine of 1.05, consistent with mild dehydration. Patient was managed with iv fluids overnight, and this AM, BUN is 23, with creatinine of 0.87. We shall continue iv fluids for now.  2. Query UTI: Patient has a pyuria, although only few bacteria in urine. This could represent early UTI. Given patient's age, and diabetes mellitus, will treat fully, with a 7-day course of Ciprofloxacin.    3. 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 Patient is on Aggrenox and statin.  4. Hypertension: this is controlled on pre-admission antihypertensives.   5. Diabetes mellitus: Type 2. Controlled, based on random blood glucose. Will check HBA1C, and manage with diet, as CBGs are normal, and patient was not on any hypoglycemic meds, pre-admission.  6. Dyslipidemia. On statin. Check Lipid profile and TSH.    LOS:  1 day   [name],CHRISTOPHER 10/23/2011, 8:23 AM

## 2011-10-23 NOTE — H&P (Signed)
PCP:   Dr. Eula Listen   Chief Complaint:  Loss of consciousness  HPI: This is a 71 year old female who was at home eating with the family when she lost consciousness. They called her several times and shook her, she eventually regained consciousness after a few minutes. Per family patient was somewhat confused, they took her to the ER. Patient does have a history of multiple CVAs and residual right-sided weakness. Patient say she has had a cold for the past 2 days with runny nose and myalgia. She was started azithromycin 2 days ago. She had an episode of nausea and vomiting after starting medications. Otherwise she has no complaints, no fevers, no chills, no recurrent nausea or vomiting, no cough. Patient denies altered mentation post syncope. History obtained mostly from the patient and the ER physician, family is not at bedside. Patient denies any dysphagia.  Review of Systems: Positives bolded   anorexia, fever, weight loss,, vision loss, decreased hearing, hoarseness, chest pain, syncope, dyspnea on exertion, peripheral edema, balance deficits, hemoptysis, abdominal pain, melena, hematochezia, severe indigestion/heartburn, hematuria, incontinence, genital sores, muscle weakness, suspicious skin lesions, transient blindness, difficulty walking, depression, unusual weight change, abnormal bleeding, enlarged lymph nodes, angioedema, and breast masses.  Past Medical History: Past Medical History  Diagnosis Date  . Stroke   . Hypertension   . Hypercholesteremia   . Diabetes mellitus    No past surgical history on file.  Medications: Prior to Admission medications   Medication Sig Start Date End Date Taking? Authorizing Provider  azithromycin (ZITHROMAX) 250 MG tablet Take 250-500 mg by mouth daily. Take 2 tabs the first day then 1 tab daily for the next 4 days 10/21/11 10/25/11 Yes Historical Provider, MD  bisacodyl (DULCOLAX) 5 MG EC tablet Take 5 mg by mouth daily as needed. For constipation    Yes Historical Provider, MD  cloNIDine (CATAPRES - DOSED IN MG/24 HR) 0.1 mg/24hr patch Place 1 patch onto the skin once a week.   Yes Historical Provider, MD  diltiazem (DILACOR XR) 240 MG 24 hr capsule Take 240 mg by mouth daily.   Yes Historical Provider, MD  dipyridamole-aspirin (AGGRENOX) 25-200 MG per 12 hr capsule Take 1 capsule by mouth 2 (two) times daily.   Yes Historical Provider, MD  hydrALAZINE (APRESOLINE) 25 MG tablet Take 25 mg by mouth 2 (two) times daily.   Yes Historical Provider, MD  lisinopril (PRINIVIL,ZESTRIL) 20 MG tablet Take 20 mg by mouth 2 (two) times daily.   Yes Historical Provider, MD  rosuvastatin (CRESTOR) 5 MG tablet Take 5 mg by mouth daily.   Yes Historical Provider, MD    Allergies:  No Known Allergies  Social History:  reports that she has never smoked. She does not have any smokeless tobacco history on file. She reports that she does not drink alcohol or use illicit drugs. use a rolling walker  Family History: Family History  Problem Relation Age of Onset  . Diabetes type II    . Hypertension      Physical Exam: Filed Vitals:   10/22/11 2240 10/22/11 2315 10/23/11 0015 10/23/11 0029  BP: 166/62 161/63 144/64 144/64  Pulse: 60 55 59 60  Temp:      TempSrc:      Resp:  15 24 19   SpO2:  97% 95% 96%    General:  Alert and oriented times three, well developed and nourished, no acute distress Eyes: PERRLA, pink conjunctiva, no scleral icterus ENT: Moist oral mucosa, neck supple, no thyromegaly  Lungs: clear to ascultation, no wheeze, no crackles, no use of accessory muscles Cardiovascular: regular rate and rhythm, no regurgitation, no gallops, no murmurs. No carotid bruits, no JVD Abdomen: soft, positive BS, non-tender, non-distended, no organomegaly, not an acute abdomen GU: not examined Neuro: Left-sided facial droop [chronic], sensation intact Musculoskeletal: strength 5/5 all extremities, however, patient states her right side is weaker  [chronic], no clubbing, cyanosis or edema Skin: no rash, no subcutaneous crepitation, no decubitus Psych: appropriate patient   Labs on Admission:   [facility] 10/22/11 2136  NA 143  K 4.0  CL 109  CO2 26  GLUCOSE 124*  BUN 28*  CREATININE 1.05  CALCIUM 9.5  MG --  PHOS --    Basename 10/22/11 2136  AST 20  ALT 20  ALKPHOS 61  BILITOT 0.1*  PROT 6.9  ALBUMIN 3.3*   No results found for this basename: LIPASE:2,AMYLASE:2 in the last 72 hours  Basename 10/22/11 2136  WBC 8.6  NEUTROABS 6.2  HGB 11.6*  HCT 34.9*  MCV 85.5  PLT 234    Basename 10/22/11 2136  CKTOTAL --  CKMB --  CKMBINDEX --  TROPONINI <0.30   No components found with this basename: POCBNP:3 No results found for this basename: DDIMER:2 in the last 72 hours No results found for this basename: HGBA1C:2 in the last 72 hours No results found for this basename: CHOL:2,HDL:2,LDLCALC:2,TRIG:2,CHOLHDL:2,LDLDIRECT:2 in the last 72 hours No results found for this basename: TSH,T4TOTAL,FREET3,T3FREE,THYROIDAB in the last 72 hours No results found for this basename: VITAMINB12:2,FOLATE:2,FERRITIN:2,TIBC:2,IRON:2,RETICCTPCT:2 in the last 72 hours  Micro Results: No results found for this or any previous visit (from the past 240 hour(s)). Results for [name], [name] (MRN [id number]) as of 10/23/2011 00:54  Ref. Range 10/22/2011 23:30  Color, Urine Latest Range: YELLOW  YELLOW  APPearance Latest Range: CLEAR  CLOUDY (A)  Specific Gravity, Urine Latest Range: 1.005-1.030  >1.030 (H)  pH Latest Range: 5.0-8.0  5.5  Glucose, UA Latest Range: NEGATIVE mg/dL NEGATIVE  Bilirubin Urine Latest Range: NEGATIVE  NEGATIVE  Ketones, ur Latest Range: NEGATIVE mg/dL NEGATIVE  Protein Latest Range: NEGATIVE mg/dL 30 (A)  Urobilinogen, UA Latest Range: 0.0-1.0 mg/dL 0.2  Nitrite Latest Range: NEGATIVE  NEGATIVE  Leukocytes, UA Latest Range: NEGATIVE  MODERATE (A)  Hgb urine dipstick Latest Range: NEGATIVE  TRACE (A)    Urine-Other No range found MICROSCOPIC EXAM PERFORMED ON UNCONCENTRATED URINE  WBC, UA Latest Range: <3 WBC/hpf 21-50  RBC / HPF Latest Range: <3 RBC/hpf 3-6  Squamous Epithelial / LPF Latest Range: RARE  FEW (A)  Bacteria, UA Latest Range: RARE  FEW (A)    Radiological Exams on Admission: Dg Chest 2 View  10/22/2011  *RADIOLOGY REPORT*  Clinical Data: Loss of consciousness.  History of hypertension, diabetes, stroke, nonsmoker.  CHEST - 2 VIEW  Comparison: 10/26/2010  Findings: Shallow inspiration.  Mild prominence of heart size and pulmonary vascularity is likely normal for technique.  There is linear atelectasis in the left lung base.  No focal consolidation. No blunting of costophrenic angles.  No pneumothorax.  Degenerative changes in the thoracic spine.  IMPRESSION: Shallow inspiration with linear atelectasis in the left lung base.  Original Report Authenticated By: Marlon Pel, M.D.   Ct Head Wo Contrast  10/22/2011  *RADIOLOGY REPORT*  Clinical Data: Spontaneous loss of consciousness.  History of previous TIAs.  CT HEAD WITHOUT CONTRAST  Technique:  Contiguous axial images were obtained from the base of the skull through the  vertex without contrast.  Comparison: MRI brain 10/27/2010.  CT head 10/27/2010.  Findings: Diffuse cerebral atrophy.  Low attenuation changes throughout the deep white matter consistent with small vessel ischemia.  Old lacunar infarcts in the basal ganglia and thalami bilaterally.  Ventricular dilatation consistent with central atrophy.  No mass effect or midline shift.  No abnormal extra-axial fluid collections.  Gray-white matter junctions are distinct. Basal cisterns are not effaced.  No evidence of acute intracranial hemorrhage.  No depressed skull fractures.  Vascular calcifications.  Visualized paranasal sinuses and mastoid air cells are not opacified.  No significant changes since the previous study.  IMPRESSION: No acute intracranial abnormalities.  Chronic  atrophy and small vessel ischemic changes.  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  Original Report Authenticated By: Marlon Pel, M.D.    EKG: Normal sinus rhythm  Assessment/Plan Present on Admission:  .Syncope Admit observation telemetry Patient appears at neurological greatest Will order MR/MRA of the brain in a.m for completeness. continue home medications. Doubt acute CVA Hypertension Dyslipidemia Diabetes Resume home medications  ADA diet and sliding scale insulin   Full code DVT prophylaxis Team 6/Dr. Derry Lory, Stephanine Reas 10/23/2011, 12:51 AM

## 2011-10-24 DIAGNOSIS — I10 Essential (primary) hypertension: Secondary | ICD-10-CM

## 2011-10-24 DIAGNOSIS — E1165 Type 2 diabetes mellitus with hyperglycemia: Secondary | ICD-10-CM

## 2011-10-24 DIAGNOSIS — I6359 Cerebral infarction due to unspecified occlusion or stenosis of other cerebral artery: Secondary | ICD-10-CM

## 2011-10-24 DIAGNOSIS — R55 Syncope and collapse: Secondary | ICD-10-CM

## 2011-10-24 LAB — BASIC METABOLIC PANEL
CO2: 23 mEq/L (ref 19–32)
Chloride: 109 mEq/L (ref 96–112)
Creatinine, Ser: 0.89 mg/dL (ref 0.50–1.10)
Glucose, Bld: 81 mg/dL (ref 70–99)

## 2011-10-24 LAB — CBC
HCT: 35.1 % — ABNORMAL LOW (ref 36.0–46.0)
Hemoglobin: 11.6 g/dL — ABNORMAL LOW (ref 12.0–15.0)
MCV: 86.5 fL (ref 78.0–100.0)
Platelets: 232 10*3/uL (ref 150–400)
RBC: 4.06 MIL/uL (ref 3.87–5.11)
WBC: 6.4 10*3/uL (ref 4.0–10.5)

## 2011-10-24 LAB — URINE CULTURE: Culture  Setup Time: 201305251733

## 2011-10-24 MED ORDER — HYDRALAZINE HCL 25 MG PO TABS
25.0000 mg | ORAL_TABLET | Freq: Three times a day (TID) | ORAL | Status: DC
  Administered 2011-10-24 – 2011-10-25 (×3): 25 mg via ORAL
  Filled 2011-10-24 (×5): qty 1

## 2011-10-24 NOTE — Progress Notes (Signed)
Bilateral carotid artery duplex completed.  Preliminary report is no evidence of significant ICA stenosis. 

## 2011-10-24 NOTE — Discharge Summary (Signed)
Physician Discharge Summary  Patient ID: EVAROSE ALTLAND MRN: [id number] DOB/AGE: 05/31/1941 71 y.o.  Admit date: 10/22/2011 Discharge date: 10/25/2011  Primary Care Physician:  No primary provider on file.   Discharge Diagnoses:    Patient Active Problem List  Diagnoses  . Syncope  . H/O: CVA (cerebrovascular accident)  . Hypertension  . Diabetes mellitus  . CVA (cerebral infarction)  . UTI (lower urinary tract infection)  . Dehydration    Medication List  As of 10/25/2011 12:20 PM   STOP taking these medications         azithromycin 250 MG tablet         TAKE these medications         bisacodyl 5 MG EC tablet   Commonly known as: DULCOLAX   Take 5 mg by mouth daily as needed. For constipation      ciprofloxacin 500 MG tablet   Commonly known as: CIPRO   Take 1 tablet (500 mg total) by mouth 2 (two) times daily.      cloNIDine 0.1 mg/24hr patch   Commonly known as: CATAPRES - Dosed in mg/24 hr   Place 1 patch onto the skin once a week.      diltiazem 240 MG 24 hr capsule   Commonly known as: DILACOR XR   Take 240 mg by mouth daily.      dipyridamole-aspirin 25-200 MG per 12 hr capsule   Commonly known as: AGGRENOX   Take 1 capsule by mouth 2 (two) times daily.      hydrALAZINE 25 MG tablet   Commonly known as: APRESOLINE   Take 1 tablet (25 mg total) by mouth 3 (three) times daily.      lisinopril 20 MG tablet   Commonly known as: PRINIVIL,ZESTRIL   Take 20 mg by mouth 2 (two) times daily.      rosuvastatin 5 MG tablet   Commonly known as: CRESTOR   Take 5 mg by mouth daily.             Disposition and Follow-up:  Follow up with Primary MD. Follow up with Dr Delia Heady, neurologist.  Consults:  None.  Discussed with Drs Thana Farr and Delia Heady, neurologists.   Significant Diagnostic Studies:  Dg Chest 2 View  10/22/2011  *RADIOLOGY REPORT*  Clinical Data: Loss of consciousness.  History of hypertension, diabetes, stroke,  nonsmoker.  CHEST - 2 VIEW  Comparison: 10/26/2010  Findings: Shallow inspiration.  Mild prominence of heart size and pulmonary vascularity is likely normal for technique.  There is linear atelectasis in the left lung base.  No focal consolidation. No blunting of costophrenic angles.  No pneumothorax.  Degenerative changes in the thoracic spine.  IMPRESSION: Shallow inspiration with linear atelectasis in the left lung base.  Original Report Authenticated By: Marlon Pel, M.D.   Ct Head Wo Contrast  10/22/2011  *RADIOLOGY REPORT*  Clinical Data: Spontaneous loss of consciousness.  History of previous TIAs.  CT HEAD WITHOUT CONTRAST  Technique:  Contiguous axial images were obtained from the base of the skull through the vertex without contrast.  Comparison: MRI brain 10/27/2010.  CT head 10/27/2010.  Findings: Diffuse cerebral atrophy.  Low attenuation changes throughout the deep white matter consistent with small vessel ischemia.  Old lacunar infarcts in the basal ganglia and thalami bilaterally.  Ventricular dilatation consistent with central atrophy.  No mass effect or midline shift.  No abnormal extra-axial fluid collections.  Gray-white matter junctions are distinct. Basal  cisterns are not effaced.  No evidence of acute intracranial hemorrhage.  No depressed skull fractures.  Vascular calcifications.  Visualized paranasal sinuses and mastoid air cells are not opacified.  No significant changes since the previous study.  IMPRESSION: No acute intracranial abnormalities.  Chronic atrophy and small vessel ischemic changes.  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  Original Report Authenticated By: Marlon Pel, M.D.   Mr Laqueta Jean Wo Contrast  10/23/2011  *RADIOLOGY REPORT*  Clinical Data: Slurred speech.  Loss of consciousness.  Possible stroke.  MRI HEAD WITHOUT AND WITH CONTRAST  Technique:  Multiplanar, multiecho pulse sequences of the brain and surrounding structures were obtained according to standard  protocol without and with intravenous contrast  Contrast: 15mL MULTIHANCE GADOBENATE DIMEGLUMINE 529 MG/ML IV SOLN  Comparison: Head CT 10/22/2011.  MR 10/27/2010.  Findings: There is an old infarction within the left side of the pons.  There are old small vessel infarctions within the cerebellum.  The cerebral hemispheres show atrophy with extensive chronic small vessel disease throughout the deep and subcortical white matter.  There are old lacunar infarctions affecting the basal ganglia and thalami.  Diffusion imaging suggest the presence of 80 mm acute infarction along the insular cortex on the right. No larger acute infarction.  No mass lesion, hemorrhage, hydrocephalus or extra-axial collection.  No pituitary mass.  There are mucosal inflammatory changes of the paranasal sinuses.  IMPRESSION: Atrophy and extensive chronic small vessel disease throughout the brain.  Suspicion of an acute 2 mm infarction affecting the insular cortex on the right.  Original Report Authenticated By: Thomasenia Sales, M.D.    Brief H and P: For complete details, refer to admission H and P. However, in brief, this is a 71 year old female, with known history of HTN, dyslipidemia, DM, previous CVAs, with mild residual right hemiparesis, who was at home eating with the family when she lost consciousness. They called her several times and shook her, she eventually regained consciousness after a few minutes, with residual transient confusion.  Family brought patient to the ED, and she was admitted for further evaluation, investigation and management.  Physical Exam: On 10/25/11. General: Comfortable, alert, communicative, fully oriented, not short of breath at rest, appears to have mild expressive dysphasia.  HEENT: No clinical pallor, no jaundice, no conjunctival injection or discharge. Hydration status is fair.  NECK: Supple, JVP not seen, no carotid bruits, no palpable lymphadenopathy, no palpable goiter.  CHEST: Clinically  clear to auscultation, no wheezes, no crackles.  HEART: Sounds 1 and 2 heard, normal, regular, soft systolic murmur at apex.  ABDOMEN: Full, soft, non-tender, no palpable organomegaly, no palpable masses, normal bowel sounds.  GENITALIA: Not examined.  LOWER EXTREMITIES: No pitting edema, palpable peripheral pulses.  MUSCULOSKELETAL SYSTEM: Generalized osteoarthritic changes, otherwise, normal.  CENTRAL NERVOUS SYSTEM: Residual mild right hemiparesis.   Hospital Course:  Principal Problem:  *Syncope: Etiology is unclear at this time, but may be vaso-vagal. Patient however, did have an elevated BUN/creatinine ratio, consistent with at least mild dehydration, which may have been contributory. Seizure disorder, is also a consideration, given known history of cerebrovascular disease. Head Ct scan showed no acute intracranial abnormalities, chronic atrophy and small vessel ischemic changes, multiple old lacunar infarcts. MRI revealed a possible acute CVA. For details, see #3 below. Carotid artery duplex showed no evidence of significant ICA stenosis, 2D Echocardiogram of 10/23/11, showed normal left ventricular cavity size, ejection fraction was in the range of 55% to 60%, and there were no regional  wall motion abnormalities. D-Dimer was normal at <0.22. EKG showed sinus rhythm, and telemetry has revealed no arrhythmias.  Active Problems:  1. Mild dehydration: BUN was 28, with creatinine of 1.05, consistent with mild dehydration. Patient was managed with iv fluids and as of 10/24/11, dehydration has resolved. Creatinine is now 0.89, with BUN of 15. IV fluids have been discontinued.  2. Query UTI: Patient has a pyuria, although only few bacteria in urine. This could represent early UTI. Given patient's age, and diabetes mellitus, will treat fully, with a 7-day course of Ciprofloxacin, to be concluded on 10/29/11.  3. CVA (cerebrovascular accident): Patient has known history of cerebrovascular disease, with  right hemiparesis and expressive dysphasia. Brain MRI of 10/23/11, revealed suspicion of an acute 2 mm infarction affecting the insular cortex on the right. Patient is already on maximal treatment with Aggrenox and statin, and has no appreciable new focal neurology. This is unlikely to have been the cause of her syncopal episode, unless there was an associated seizure. I did discuss with Dr Creed Copper, neurologist on 10/24/11, and she has opined that EEG is indeed, indicated, and can be done on an outpatient basis. Dr Delia Heady, neurologist, has kindly offered to have patient follow up with him in 2-3 weeks. PT has evaluated patient, and no acute PT needs, identified. 4. Hypertension: This was sub-optimally controlled on current pre-admission antihypertensives, even against a background of probable new CVA. Have increased Hydralazine to 25 mg t.i.d.  5. Diabetes mellitus: This is diet-controlled type 2. CBGs are normal, and HBA1C is 6.0.  6. Dyslipidemia. On statin. Lipid profile shows TC 131, TG 56, HDL 66, LDL 54.   Comment: Stable for discharge on 10/25/11.   Time spent on Discharge: 45 mins.  Signed: Ambera Fedele,CHRISTOPHER 10/25/2011, 12:20 PM

## 2011-10-24 NOTE — Progress Notes (Signed)
Subjective: No new issues.  Objective: Vital signs in last 24 hours: Temp:  [97.2 F (36.2 C)-98.2 F (36.8 C)] 97.2 F (36.2 C) (05/26 0200) Pulse Rate:  [55-61] 55  (05/26 1050) Resp:  [18-20] 20  (05/26 0404) BP: (138-178)/(50-60) 178/60 mmHg (05/26 1050) SpO2:  [95 %-99 %] 99 % (05/26 0404) Weight:  [69.8 kg (153 lb 14.1 oz)] 69.8 kg (153 lb 14.1 oz) (05/26 0404) Weight change: -0.1 kg (-3.5 oz) Last BM Date: 10/23/11  Intake/Output from previous day: 05/25 0701 - 05/26 0700 In: 2486.3 [P.O.:740; I.V.:1746.3] Out: 800 [Urine:800] Total I/O In: 240 [P.O.:240] Out: -    Physical Exam: General: Comfortable, alert, communicative, fully oriented, not short of breath at rest, appears to have mild expressive dysphasia.  HEENT:  No clinical pallor, no jaundice, no conjunctival injection or discharge. Hydration status is fair. NECK:  Supple, JVP not seen, no carotid bruits, no palpable lymphadenopathy, no palpable goiter. CHEST:  Clinically clear to auscultation, no wheezes, no crackles. HEART:  Sounds 1 and 2 heard, normal, regular, soft systolic murmur at apex. ABDOMEN:  Full, soft, non-tender, no palpable organomegaly, no palpable masses, normal bowel sounds. GENITALIA:  Not examined. LOWER EXTREMITIES:  No pitting edema, palpable peripheral pulses. MUSCULOSKELETAL SYSTEM:  Generalized osteoarthritic changes, otherwise, normal. CENTRAL NERVOUS SYSTEM:  Residual mild right hemiparesis..  Lab Results:  Basename 10/24/11 0500 10/23/11 0640  WBC 6.4 6.6  HGB 11.6* 11.2*  HCT 35.1* 33.6*  PLT 232 233    Basename 10/24/11 0500 10/23/11 0640  NA 139 142  K 4.3 3.8  CL 109 110  CO2 23 22  GLUCOSE 81 81  BUN 15 23  CREATININE 0.89 0.87  CALCIUM 9.2 9.1   No results found for this or any previous visit (from the past 240 hour(s)).   Studies/Results: Dg Chest 2 View  10/22/2011  *RADIOLOGY REPORT*  Clinical Data: Loss of consciousness.  History of hypertension,  diabetes, stroke, nonsmoker.  CHEST - 2 VIEW  Comparison: 10/26/2010  Findings: Shallow inspiration.  Mild prominence of heart size and pulmonary vascularity is likely normal for technique.  There is linear atelectasis in the left lung base.  No focal consolidation. No blunting of costophrenic angles.  No pneumothorax.  Degenerative changes in the thoracic spine.  IMPRESSION: Shallow inspiration with linear atelectasis in the left lung base.  Original Report Authenticated By: Marlon Pel, M.D.   Ct Head Wo Contrast  10/22/2011  *RADIOLOGY REPORT*  Clinical Data: Spontaneous loss of consciousness.  History of previous TIAs.  CT HEAD WITHOUT CONTRAST  Technique:  Contiguous axial images were obtained from the base of the skull through the vertex without contrast.  Comparison: MRI brain 10/27/2010.  CT head 10/27/2010.  Findings: Diffuse cerebral atrophy.  Low attenuation changes throughout the deep white matter consistent with small vessel ischemia.  Old lacunar infarcts in the basal ganglia and thalami bilaterally.  Ventricular dilatation consistent with central atrophy.  No mass effect or midline shift.  No abnormal extra-axial fluid collections.  Gray-white matter junctions are distinct. Basal cisterns are not effaced.  No evidence of acute intracranial hemorrhage.  No depressed skull fractures.  Vascular calcifications.  Visualized paranasal sinuses and mastoid air cells are not opacified.  No significant changes since the previous study.  IMPRESSION: No acute intracranial abnormalities.  Chronic atrophy and small vessel ischemic changes.  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  Original Report Authenticated By: Marlon Pel, M.D.   Mr Laqueta Jean Wo Contrast  10/23/2011  *  RADIOLOGY REPORT*  Clinical Data: Slurred speech.  Loss of consciousness.  Possible stroke.  MRI HEAD WITHOUT AND WITH CONTRAST  Technique:  Multiplanar, multiecho pulse sequences of the brain and surrounding structures were obtained  according to standard protocol without and with intravenous contrast  Contrast: 15mL MULTIHANCE GADOBENATE DIMEGLUMINE 529 MG/ML IV SOLN  Comparison: Head CT 10/22/2011.  MR 10/27/2010.  Findings: There is an old infarction within the left side of the pons.  There are old small vessel infarctions within the cerebellum.  The cerebral hemispheres show atrophy with extensive chronic small vessel disease throughout the deep and subcortical white matter.  There are old lacunar infarctions affecting the basal ganglia and thalami.  Diffusion imaging suggest the presence of 80 mm acute infarction along the insular cortex on the right. No larger acute infarction.  No mass lesion, hemorrhage, hydrocephalus or extra-axial collection.  No pituitary mass.  There are mucosal inflammatory changes of the paranasal sinuses.  IMPRESSION: Atrophy and extensive chronic small vessel disease throughout the brain.  Suspicion of an acute 2 mm infarction affecting the insular cortex on the right.  Original Report Authenticated By: Thomasenia Sales, M.D.    Medications: Scheduled Meds:    . atorvastatin  20 mg Oral q1800  . ciprofloxacin  500 mg Oral BID  . cloNIDine  0.1 mg Transdermal Weekly  . diltiazem  240 mg Oral Daily  . dipyridamole-aspirin  1 capsule Oral BID  . enoxaparin  40 mg Subcutaneous Q24H  . hydrALAZINE  25 mg Oral BID  . lisinopril  20 mg Oral BID  . pneumococcal 23 valent vaccine  0.5 mL Intramuscular Tomorrow-1000  . sodium chloride  3 mL Intravenous Q12H   Continuous Infusions:    . 0.9 % NaCl with KCl 20 mEq / L 75 mL/hr at 10/23/11 2111   PRN Meds:.acetaminophen, acetaminophen, alum & mag hydroxide-simeth, bisacodyl, gadobenate dimeglumine, HYDROcodone-acetaminophen, ondansetron (ZOFRAN) IV, ondansetron  Assessment/Plan:  Principal Problem:  *Syncope: Etiology is unclear at this time, but may be vaso-vagal. Patient however, did have an elevated BUN/creatinine ratio, consistent with at least  mild dehydration, which may have been contributory. Seizure disorder, is also a consideration, given known history of cerebrovascular disease. Head Ct scan showed no acute intracranial abnormalities, chronic atrophy and small vessel ischemic changes, multiple old lacunar infarcts. MRI revealed a possible acute CVA. For details, see #3 below. Carotid artery duplex showed no evidence of significant ICA stenosis, 2D Echocardiogram of 10/23/11, showed normal left ventricular cavity size, ejection fraction was in the range of 55% to 60%, and there were no regional wall motion abnormalities. D-Dimer was normal at <0.22. EKG showed sinus rhythm, and telemetry has revealed no arrhythmias. EEG is pending. Active Problems: 1. Mild dehydration: BUN was 28, with creatinine of 1.05, consistent with mild dehydration. Patient was managed with iv fluids and as of 10/24/11, dehydration has resolved. Creatinine is now 0.89, with BUN of 15. IV fluids have been discontinued.  2. Query UTI: Patient has a pyuria, although only few bacteria in urine. This could represent early UTI. Given patient's age, and diabetes mellitus, will treat fully, with a 7-day course of Ciprofloxacin (Now day# 2).    3. CVA (cerebrovascular accident): Patient has known history of cerebrovascular disease, with right hemiparesis and expressive dysphasia. Brain MRI of 10/23/11, revealed suspicion of an acute 2 mm infarction affecting the insular cortex on the right. Patient is already on maximal treatment with Aggrenox and statin, and has no appreciable new focal  neurology. We shall seek neurologist input and outpatient follow up. This is unlikely to have been the cause of her syncopal episode, unless there was an associated seizure.  4. Hypertension: This is reasonably controlled on pre-admission antihypertensives.   5. Diabetes mellitus: This is diet-controlled type 2. CBGs are normal, and HBA1C is 6.0.  6. Dyslipidemia. On statin. Check Lipid profile shows  TC 131, TG 56, HDL 66, LDL 54.  Comment: Possible discharge later today.   LOS: 2 days   [name],CHRISTOPHER 10/24/2011, 10:52 AM

## 2011-10-25 ENCOUNTER — Observation Stay (HOSPITAL_COMMUNITY): Payer: Medicare HMO

## 2011-10-25 DIAGNOSIS — E1165 Type 2 diabetes mellitus with hyperglycemia: Secondary | ICD-10-CM

## 2011-10-25 DIAGNOSIS — N390 Urinary tract infection, site not specified: Secondary | ICD-10-CM | POA: Clinically undetermined

## 2011-10-25 DIAGNOSIS — R404 Transient alteration of awareness: Secondary | ICD-10-CM

## 2011-10-25 DIAGNOSIS — I639 Cerebral infarction, unspecified: Secondary | ICD-10-CM | POA: Diagnosis present

## 2011-10-25 DIAGNOSIS — I10 Essential (primary) hypertension: Secondary | ICD-10-CM

## 2011-10-25 DIAGNOSIS — E860 Dehydration: Secondary | ICD-10-CM | POA: Diagnosis present

## 2011-10-25 DIAGNOSIS — R55 Syncope and collapse: Secondary | ICD-10-CM

## 2011-10-25 DIAGNOSIS — I6359 Cerebral infarction due to unspecified occlusion or stenosis of other cerebral artery: Secondary | ICD-10-CM

## 2011-10-25 MED ORDER — HYDRALAZINE HCL 25 MG PO TABS: 25.0000 mg | ORAL_TABLET | Freq: Three times a day (TID) | ORAL | Status: DC

## 2011-10-25 MED ORDER — CIPROFLOXACIN HCL 500 MG PO TABS: 500.0000 mg | ORAL_TABLET | Freq: Two times a day (BID) | ORAL | Status: AC

## 2011-10-25 NOTE — Procedures (Signed)
REFERRING PHYSICIAN:  Isidor Holts, M.D.  HISTORY:  A 71 year old female with episode of loss of consciousness.  MEDICATIONS:  Zithromax, Catapres, Aggrenox, Crestor, Lipitor, Zofran, Prinivil, Zestril.  CONDITIONS OF RECORDING:  This is a 16 channel EEG carried out with the patient in the awake, drowsy, and asleep states.  DESCRIPTION:  Muscle and movement artifact are prominent during the tracing, particularly during wakefulness.  When the background rhythm was able to be evaluated, the posterior background rhythm consisted of a 10 Hz alpha activity that was poorly sustained and seen from the parieto- occipital and posterotemporal regions.  There is a mixture of sense of alpha and theta rhythms seen from the central and temporal regions. Fast activities were noted anteriorly.  The patient drowse  with some decrease in artifact activity.  The background activity during drowse is slow and poorly organized.  The patient goes into a light sleep with symmetrical sleep spindles, vertex with a sharp activity and irregular slow activity.  Hyperventilation was not performed.  Intermittent photic stimulation failed to list any change in the tracing.  IMPRESSION:  This is a normal EEG.          ______________________________ Thana Farr, MD    [id number] D:  10/25/2011 12:27:00  T:  10/25/2011 19:37:20  Job #:  [id number]

## 2011-10-25 NOTE — Progress Notes (Signed)
Cosign for Shannon Albrecht RN assessment, med admin, and notes. 

## 2011-10-25 NOTE — Progress Notes (Signed)
   CARE MANAGEMENT NOTE 10/25/2011  Patient:  [name], [name]   Account Number:  [id number]  Date Initiated:  10/25/2011  Documentation initiated by:  Shuayb Schepers  Subjective/Objective Assessment:   Met with pt re HH needs.     Action/Plan:   Pt has support from family and church by [facility] aide, no HH needs identified, has DME.   Anticipated DC Date:  10/25/2011   Anticipated DC Plan:  HOME/SELF CARE         Choice offered to / List presented to:             Status of service:  Completed, signed off Medicare Important Message given?   (If response is "NO", the following Medicare IM given date fields will be blank) Date Medicare IM given:   Date Additional Medicare IM given:    Discharge Disposition:  HOME/SELF CARE  Per UR Regulation:    If discussed at Long Length of Stay Meetings, dates discussed:    Comments:

## 2011-10-25 NOTE — Progress Notes (Signed)
OT Note: OT screen completed. Pt. Functioning near baseline and pt. reports no need for OT. Pt. Has aides come throughout the day to assist PRN. Per. P.T. Pt. Also functioning at mod I with mobility. Will sign off acutely.   Cassandria Anger, OTR/L Pager: [phone] 10/25/2011 .

## 2011-10-25 NOTE — Evaluation (Signed)
Physical Therapy Evaluation and Discharge Patient Details Name: [name] MRN: [id number] DOB: 1940-12-26 Today's Date: 10/25/2011 Time: 6578-4696 PT Time Calculation (min): 22 min  PT Assessment / Plan / Recommendation Clinical Impression  71 y/o female admitted for syncope.  Pt presents with no Acute PT needs and demonstrated no significangt symptoms of vestibular disfunction.  Acute PT signing off.      PT Assessment  Patent does not need any further PT services    Follow Up Recommendations  No PT follow up    Barriers to Discharge        lEquipment Recommendations  None recommended by PT    Recommendations for Other Services     Frequency      Precautions / Restrictions Precautions Precautions: Fall Restrictions Weight Bearing Restrictions: No   Pertinent Vitals/Pain Pt denied pain.        Mobility  Bed Mobility Bed Mobility: Supine to Sit;Sit to Supine;Sit to Sidelying Right;Right Sidelying to Sit Right Sidelying to Sit: 5: Supervision Supine to Sit: HOB flat;6: Modified independent (Device/Increase time) Sit to Supine: HOB flat;6: Modified independent (Device/Increase time) Sit to Sidelying Right: 5: Supervision;HOB flat Details for Bed Mobility Assistance: Verbal and tactile cues for sit<>sidelying technique due to excessive energy expenditure with supine <>sit.   Transfers Transfers: Sit to Stand;Stand to Sit;Stand Pivot Transfers Sit to Stand: 7: Independent;From chair/3-in-1;With upper extremity assist;From bed Stand to Sit: 7: Independent;To bed;To chair/3-in-1;With upper extremity assist Stand Pivot Transfers: 7: Independent Ambulation/Gait Ambulation/Gait Assistance: 6: Modified independent (Device/Increase time) Ambulation Distance (Feet): 50 Feet Assistive device: Rolling walker Ambulation/Gait Assistance Details: Pt demonstrates safety and modified independence.  Gait Pattern: Shuffle;Decreased stride length Gait velocity: WFL General Gait  Details: Shuffle gait pt reports ambulating this way for sometime.  Stairs: No Wheelchair Mobility Wheelchair Mobility: No    Exercises     PT Diagnosis:    PT Problem List:   PT Treatment Interventions:     PT Goals    Visit Information  Last PT Received On: 10/25/11 Assistance Needed: +1    Subjective Data  Subjective: I don't think I will need any therapy. Patient Stated Goal: Return to home living independently.     Prior Functioning  Home Living Lives With: Alone Available Help at Discharge: Personal care attendant;Family Type of Home: House Home Access: Stairs to enter [employer] of Steps: 3 Entrance Stairs-Rails: Right;Left;Can reach both Home Layout: One level Bathroom Shower/Tub: Forensic scientist: Handicapped height Bathroom Accessibility: Yes How Accessible: Accessible via walker Home Adaptive Equipment: Grab bars in shower;Bedside commode/3-in-1;Walker - four wheeled Prior Function Level of Independence: Independent with assistive device(s) Able to Take Stairs?: Yes Driving: No Vocation: Retired Musician: No difficulties Dominant Hand: Right    Cognition  Overall Cognitive Status: Appears within functional limits for tasks assessed/performed Arousal/Alertness: Awake/alert Orientation Level: Oriented X4 / Intact    Extremity/Trunk Assessment Right Upper Extremity Assessment RUE ROM/Strength/Tone: Within functional levels Left Upper Extremity Assessment LUE ROM/Strength/Tone: Within functional levels Right Lower Extremity Assessment RLE ROM/Strength/Tone: Within functional levels Left Lower Extremity Assessment LLE ROM/Strength/Tone: Within functional levels Trunk Assessment Trunk Assessment: Normal   Balance Balance Balance Assessed: No  End of Session PT - End of Session Equipment Utilized During Treatment: Gait belt Activity Tolerance: Patient tolerated treatment well Patient left: in  chair;with call bell/phone within reach Nurse Communication: Mobility status   Brennyn Ortlieb 10/25/2011, 10:46 AM Theron Arista L. Derita Michelsen DPT [phone]

## 2011-10-25 NOTE — Discharge Instructions (Signed)
Please call Dr Marlis Edelson office, neurology, to schedule appointment for 2-3 weeks.

## 2011-10-25 NOTE — Progress Notes (Signed)
IV d/ced'. Tele d/c'ed. D/C instructions and medications discussed with pt and pt's family. All questions answered. Pt states understanding. Pt escorted out with staff via w/c. Pt d/c'ed to home.

## 2011-10-25 NOTE — Progress Notes (Signed)
Received referral for [facility] needs from pt RN, however needs not identified at this time. Will follow for specific orders from MD.  Johny Shock RN MPH Case Manager [phone]

## 2012-10-09 ENCOUNTER — Other Ambulatory Visit: Payer: Self-pay | Admitting: Internal Medicine

## 2012-10-19 ENCOUNTER — Other Ambulatory Visit: Payer: Medicare HMO

## 2012-11-02 ENCOUNTER — Other Ambulatory Visit: Payer: Medicare HMO

## 2012-11-16 ENCOUNTER — Ambulatory Visit
Admission: RE | Admit: 2012-11-16 | Discharge: 2012-11-16 | Disposition: A | Discharge status: Home / Self Care | Payer: Medicare Other | Source: Ambulatory Visit | Attending: Internal Medicine | Admitting: Internal Medicine

## 2012-11-16 ENCOUNTER — Other Ambulatory Visit: Payer: Self-pay | Admitting: Internal Medicine

## 2012-11-23 ENCOUNTER — Ambulatory Visit
Admission: RE | Admit: 2012-11-23 | Discharge: 2012-11-23 | Disposition: A | Discharge status: Home / Self Care | Payer: Medicare Other | Source: Ambulatory Visit | Attending: Internal Medicine | Admitting: Internal Medicine

## 2012-11-23 ENCOUNTER — Other Ambulatory Visit: Payer: Self-pay | Admitting: Internal Medicine

## 2013-06-06 ENCOUNTER — Encounter: Payer: Self-pay | Admitting: Podiatry

## 2013-06-06 ENCOUNTER — Ambulatory Visit (INDEPENDENT_AMBULATORY_CARE_PROVIDER_SITE_OTHER): Payer: Medicare HMO | Admitting: Podiatry

## 2013-06-06 VITALS — BP 156/115 | HR 68 | Resp 16 | Ht 60.0 in | Wt 170.0 lb

## 2013-06-06 DIAGNOSIS — B351 Tinea unguium: Secondary | ICD-10-CM

## 2013-06-06 DIAGNOSIS — M79609 Pain in unspecified limb: Secondary | ICD-10-CM

## 2013-06-06 NOTE — Progress Notes (Signed)
Patient ID: [name], female   DOB: 1940/11/14, 73 y.o.   MRN: [id number] Subjective: Patient presents for ongoing debridement of painful mycotic toenails. The last visit for this service was 12/04/2012. She's been a patient in this practice since 2008.  Objective: Orientated x72 73 year old black female  Dermatological: Incurvated, elongated, discolored , hypertrophic toenails x10  Assessment: Symptomatic onychomycoses x10  Plan: Nails x10 are debrided back without a bleeding. Reappoint at 3 months.

## 2013-09-12 ENCOUNTER — Ambulatory Visit (INDEPENDENT_AMBULATORY_CARE_PROVIDER_SITE_OTHER): Payer: Medicare HMO | Admitting: Podiatry

## 2013-09-12 ENCOUNTER — Encounter: Payer: Self-pay | Admitting: Podiatry

## 2013-09-12 VITALS — BP 196/81 | HR 68 | Resp 17 | Ht 59.0 in | Wt 170.0 lb

## 2013-09-12 DIAGNOSIS — M79609 Pain in unspecified limb: Secondary | ICD-10-CM

## 2013-09-12 DIAGNOSIS — B351 Tinea unguium: Secondary | ICD-10-CM

## 2013-09-13 NOTE — Progress Notes (Signed)
Patient ID: [name], female   DOB: Feb 14, 1941, 73 y.o.   MRN: [id number]  Subjective: Patient presents for ongoing debridement of painful mycotic toenails. The last visit for this service was 06/06/2013.. She's been a patient in this practice since 2008.   Objective: Orientated x22 73 year old black female   Dermatological: Incurvated, elongated, discolored , hypertrophic toenails x10   Assessment: Symptomatic onychomycoses x10   Plan: Nails x10 are debrided back without a bleeding. Reappoint at 3 months.

## 2014-02-27 ENCOUNTER — Other Ambulatory Visit: Payer: Self-pay

## 2014-02-27 DIAGNOSIS — Z1231 Encounter for screening mammogram for malignant neoplasm of breast: Secondary | ICD-10-CM

## 2014-03-07 ENCOUNTER — Ambulatory Visit
Admission: RE | Admit: 2014-03-07 | Discharge: 2014-03-07 | Disposition: A | Discharge status: Home / Self Care | Payer: Commercial Managed Care - HMO | Source: Ambulatory Visit

## 2014-03-07 DIAGNOSIS — Z1231 Encounter for screening mammogram for malignant neoplasm of breast: Secondary | ICD-10-CM

## 2014-03-13 ENCOUNTER — Ambulatory Visit (INDEPENDENT_AMBULATORY_CARE_PROVIDER_SITE_OTHER): Payer: Medicare HMO | Admitting: Podiatry

## 2014-03-13 DIAGNOSIS — B351 Tinea unguium: Secondary | ICD-10-CM

## 2014-03-13 DIAGNOSIS — M79676 Pain in unspecified toe(s): Secondary | ICD-10-CM

## 2014-03-14 NOTE — Progress Notes (Signed)
Patient ID: [name], female   DOB: 07-21-1940, 73 y.o.   MRN: [id number]  Subjective: This patient presents complaining of painful toenails  Objective: Orientated x3 The toenails are elongated, hypertrophic, discolored 6-10  Assessment: Symptomatic onychomycoses 6-10  Plan: Debrided toenails x10 without a bleeding  Reappoint x3 months

## 2014-06-12 ENCOUNTER — Ambulatory Visit: Payer: Medicare HMO | Admitting: Podiatry

## 2014-06-26 ENCOUNTER — Ambulatory Visit: Payer: Medicare HMO | Admitting: Podiatry

## 2014-08-19 DIAGNOSIS — E11331 Type 2 diabetes mellitus with moderate nonproliferative diabetic retinopathy with macular edema: Secondary | ICD-10-CM | POA: Diagnosis not present

## 2014-08-19 DIAGNOSIS — H4011X2 Primary open-angle glaucoma, moderate stage: Secondary | ICD-10-CM | POA: Diagnosis not present

## 2014-09-19 DIAGNOSIS — E11331 Type 2 diabetes mellitus with moderate nonproliferative diabetic retinopathy with macular edema: Secondary | ICD-10-CM | POA: Diagnosis not present

## 2014-10-02 ENCOUNTER — Ambulatory Visit: Payer: Medicare HMO | Admitting: Podiatry

## 2014-10-24 ENCOUNTER — Ambulatory Visit (INDEPENDENT_AMBULATORY_CARE_PROVIDER_SITE_OTHER): Payer: Commercial Managed Care - HMO

## 2014-10-24 DIAGNOSIS — B351 Tinea unguium: Secondary | ICD-10-CM | POA: Diagnosis not present

## 2014-10-24 DIAGNOSIS — M79676 Pain in unspecified toe(s): Secondary | ICD-10-CM

## 2014-10-24 NOTE — Progress Notes (Signed)
Patient ID: [name], female   DOB: 1940-06-20, 74 y.o.   MRN: [id number] Complaint:  Visit Type: Patient returns to my office for continued preventative foot care services. Complaint: Patient states" my nails have grown long and thick and become painful to walk and wear shoes" Patient has been diagnosed with DM with no complications. He presents for preventative foot care services. No changes to ROS  Podiatric Exam: Vascular: dorsalis pedis and posterior tibial pulses are palpable bilateral. Capillary return is immediate. Temperature gradient is WNL. Skin turgor WNL  Sensorium: Normal Semmes Weinstein monofilament test. Normal tactile sensation bilaterally. Nail Exam: Pt has thick disfigured discolored nails with subungual debris noted bilateral entire nail hallux through fifth toenails Ulcer Exam: There is no evidence of ulcer or pre-ulcerative changes or infection. Orthopedic Exam: Muscle tone and strength are WNL. No limitations in general ROM. No crepitus or effusions noted. Foot type and digits show no abnormalities. Bony prominences are unremarkable. Skin: No Porokeratosis. No infection or ulcers  Diagnosis:  Tinea unguium, Pain in right toe, pain in left toes  Treatment & Plan Procedures and Treatment: Consent by patient was obtained for treatment procedures. The patient understood the discussion of treatment and procedures well. All questions were answered thoroughly reviewed. Debridement of mycotic and hypertrophic toenails, 1 through 5 bilateral and clearing of subungual debris. No ulceration, no infection noted.  Return Visit-Office Procedure: Patient instructed to return to the office for a follow up visit 3 months for continued evaluation and treatment.

## 2014-11-15 ENCOUNTER — Observation Stay (HOSPITAL_COMMUNITY)
Admission: EM | Admit: 2014-11-15 | Discharge: 2014-11-17 | Disposition: A | Discharge status: Home / Self Care | Payer: Commercial Managed Care - HMO | Attending: Internal Medicine | Admitting: Internal Medicine

## 2014-11-15 ENCOUNTER — Encounter (HOSPITAL_COMMUNITY): Payer: Self-pay | Admitting: *Deleted

## 2014-11-15 ENCOUNTER — Emergency Department (HOSPITAL_COMMUNITY): Payer: Commercial Managed Care - HMO

## 2014-11-15 DIAGNOSIS — I639 Cerebral infarction, unspecified: Secondary | ICD-10-CM | POA: Diagnosis not present

## 2014-11-15 DIAGNOSIS — R4020 Unspecified coma: Secondary | ICD-10-CM | POA: Diagnosis not present

## 2014-11-15 DIAGNOSIS — R55 Syncope and collapse: Secondary | ICD-10-CM | POA: Diagnosis not present

## 2014-11-15 DIAGNOSIS — E860 Dehydration: Secondary | ICD-10-CM | POA: Diagnosis not present

## 2014-11-15 DIAGNOSIS — R4781 Slurred speech: Secondary | ICD-10-CM

## 2014-11-15 DIAGNOSIS — R0989 Other specified symptoms and signs involving the circulatory and respiratory systems: Secondary | ICD-10-CM | POA: Diagnosis not present

## 2014-11-15 DIAGNOSIS — E780 Pure hypercholesterolemia: Secondary | ICD-10-CM | POA: Diagnosis not present

## 2014-11-15 DIAGNOSIS — R001 Bradycardia, unspecified: Secondary | ICD-10-CM | POA: Insufficient documentation

## 2014-11-15 DIAGNOSIS — E119 Type 2 diabetes mellitus without complications: Secondary | ICD-10-CM

## 2014-11-15 DIAGNOSIS — I69351 Hemiplegia and hemiparesis following cerebral infarction affecting right dominant side: Secondary | ICD-10-CM | POA: Insufficient documentation

## 2014-11-15 DIAGNOSIS — I10 Essential (primary) hypertension: Secondary | ICD-10-CM | POA: Diagnosis not present

## 2014-11-15 DIAGNOSIS — E785 Hyperlipidemia, unspecified: Secondary | ICD-10-CM | POA: Insufficient documentation

## 2014-11-15 DIAGNOSIS — R401 Stupor: Secondary | ICD-10-CM | POA: Diagnosis not present

## 2014-11-15 DIAGNOSIS — I6930 Unspecified sequelae of cerebral infarction: Secondary | ICD-10-CM

## 2014-11-15 DIAGNOSIS — R404 Transient alteration of awareness: Secondary | ICD-10-CM | POA: Diagnosis not present

## 2014-11-15 LAB — CBC WITH DIFFERENTIAL/PLATELET
BASOS ABS: 0 10*3/uL (ref 0.0–0.1)
Basophils Relative: 0 % (ref 0–1)
EOS ABS: 0 10*3/uL (ref 0.0–0.7)
EOS PCT: 0 % (ref 0–5)
HCT: 41 % (ref 36.0–46.0)
Hemoglobin: 13.3 g/dL (ref 12.0–15.0)
Lymphocytes Relative: 16 % (ref 12–46)
Lymphs Abs: 0.9 10*3/uL (ref 0.7–4.0)
MCH: 28.1 pg (ref 26.0–34.0)
MCHC: 32.4 g/dL (ref 30.0–36.0)
MCV: 86.5 fL (ref 78.0–100.0)
MONOS PCT: 8 % (ref 3–12)
Monocytes Absolute: 0.5 10*3/uL (ref 0.1–1.0)
Neutro Abs: 4.4 10*3/uL (ref 1.7–7.7)
Neutrophils Relative %: 76 % (ref 43–77)
Platelets: 257 10*3/uL (ref 150–400)
RBC: 4.74 MIL/uL (ref 3.87–5.11)
RDW: 14.9 % (ref 11.5–15.5)
WBC: 5.9 10*3/uL (ref 4.0–10.5)

## 2014-11-15 LAB — COMPREHENSIVE METABOLIC PANEL
ALK PHOS: 59 U/L (ref 38–126)
ALT: 14 U/L (ref 14–54)
ANION GAP: 7 (ref 5–15)
AST: 21 U/L (ref 15–41)
Albumin: 3.6 g/dL (ref 3.5–5.0)
BUN: 16 mg/dL (ref 6–20)
CHLORIDE: 107 mmol/L (ref 101–111)
CO2: 25 mmol/L (ref 22–32)
Calcium: 9.5 mg/dL (ref 8.9–10.3)
Creatinine, Ser: 0.99 mg/dL (ref 0.44–1.00)
GFR calc non Af Amer: 56 mL/min — ABNORMAL LOW (ref >60)
Glucose, Bld: 142 mg/dL — ABNORMAL HIGH (ref 65–99)
POTASSIUM: 4.1 mmol/L (ref 3.5–5.1)
Sodium: 139 mmol/L (ref 135–145)
Total Bilirubin: 0.5 mg/dL (ref 0.3–1.2)
Total Protein: 6.4 g/dL — ABNORMAL LOW (ref 6.5–8.1)

## 2014-11-15 LAB — URINALYSIS, ROUTINE W REFLEX MICROSCOPIC
BILIRUBIN URINE: NEGATIVE
Glucose, UA: NEGATIVE mg/dL
Hgb urine dipstick: NEGATIVE
Ketones, ur: NEGATIVE mg/dL
Leukocytes, UA: NEGATIVE
NITRITE: NEGATIVE
PH: 5.5 (ref 5.0–8.0)
Protein, ur: NEGATIVE mg/dL
SPECIFIC GRAVITY, URINE: 1.009 (ref 1.005–1.030)
Urobilinogen, UA: 1 mg/dL (ref 0.0–1.0)

## 2014-11-15 LAB — I-STAT TROPONIN, ED: TROPONIN I, POC: 0 ng/mL (ref 0.00–0.08)

## 2014-11-15 MED ORDER — ACETAMINOPHEN 325 MG PO TABS: 650.0000 mg | ORAL_TABLET | Freq: Four times a day (QID) | ORAL | Status: DC | PRN

## 2014-11-15 MED ORDER — ACETAMINOPHEN 650 MG RE SUPP: 650.0000 mg | Freq: Four times a day (QID) | RECTAL | Status: DC | PRN

## 2014-11-15 MED ORDER — ONDANSETRON HCL 4 MG PO TABS: 4.0000 mg | ORAL_TABLET | Freq: Four times a day (QID) | ORAL | Status: DC | PRN

## 2014-11-15 MED ORDER — HEPARIN SODIUM (PORCINE) 5000 UNIT/ML IJ SOLN
5000.0000 [IU] | Freq: Three times a day (TID) | INTRAMUSCULAR | Status: DC
  Administered 2014-11-15 – 2014-11-17 (×5): 5000 [IU] via SUBCUTANEOUS
  Filled 2014-11-15 (×5): qty 1

## 2014-11-15 MED ORDER — ONDANSETRON HCL 4 MG/2ML IJ SOLN: 4.0000 mg | Freq: Four times a day (QID) | INTRAMUSCULAR | Status: DC | PRN

## 2014-11-15 MED ORDER — SODIUM CHLORIDE 0.9 % IJ SOLN
3.0000 mL | Freq: Two times a day (BID) | INTRAMUSCULAR | Status: DC
Start: 2014-11-15 — End: 2014-11-17
  Administered 2014-11-15: 3 mL via INTRAVENOUS

## 2014-11-15 NOTE — ED Notes (Signed)
PA at bedside.

## 2014-11-15 NOTE — ED Notes (Signed)
Patient transported to CT 

## 2014-11-15 NOTE — ED Notes (Signed)
Per EMS, pt gave pocketbook to her friend at [employer] before being transported to the ED.  Will update pt and family/friends.

## 2014-11-15 NOTE — ED Notes (Signed)
Secretary contacting EMS to see if they have pocketbook, pt did not arrive with any belongings other than clothes and jewelry.  Will follow up

## 2014-11-15 NOTE — ED Notes (Signed)
Patient transported to X-ray 

## 2014-11-15 NOTE — Progress Notes (Signed)
Pt admitted from ED with stoke symptoms,, alert and oriented, denies any pain, pt settled in bed, tele monitor put on pt, call light at bedside, v/s stable, will continue to monitor. Obasogie-Asidi, Kamaryn Grimley Efe

## 2014-11-15 NOTE — ED Notes (Signed)
Pt presents via [facility] for a syncopal episode.  Pt was at [employer] wit friends, went unresponsive, vomited.  When EMS arrived pt was diaphoretic, lethargic, right facial droop, slurred speech, confused.  Pt has right sided weakness and facial droop from prior stroke per EMS.  Pt a x 3 on arrival.  Pt Bp was 88 sys and HR 50 on EMS arrival to scene.  Bp-143/59 HR-80s NSR, CBG-130 EKG unremarkable.

## 2014-11-15 NOTE — ED Provider Notes (Signed)
CSN: [id number]     Arrival date & time 11/15/14  1438 History   First MD Initiated Contact with Patient 11/15/14 1443     Chief Complaint  Patient presents with  . Loss of Consciousness   KIONI FRANKLYN is a 74 y.o. female with a history of a CVA, hypertension, hyperlipidemia and diabetes who presents to the emergency department after a syncopal episode while eating lunch today. EMS reports that the patient passed out after eating lunch and was found confused by EMS who noted right-sided weakness and right facial droop. Family reports that the patient has a previous right CVA with deficits and chronically has some right facial droop and right arm weakness. EMS believe that the patient's symptoms were worse but slowly resolved during transport. Patient became more oriented and speech became more coherent upon transport by EMS. No seizure like activity noted. EMS reported her initial blood pressure was 88 systolic and her heart rate was in the 60s. By my evaluation the patient has no complaints. The patient does not remember the syncopal episode or any prodromal symptoms. The patient denies headache, fevers, chills, recent illness, neck pain, back pain, abdominal pain, nausea, vomiting, changes to her vision, chest pain, shortness of breath, palpitations, numbness, tingling, weakness or rashes.   (Consider location/radiation/quality/duration/timing/severity/associated sxs/prior Treatment) HPI  Past Medical History  Diagnosis Date  . Stroke   . Hypertension   . Hypercholesteremia   . Diabetes mellitus    Past Surgical History  Procedure Laterality Date  . Tooth extraction     Family History  Problem Relation Age of Onset  . Diabetes type II    . Hypertension     History  Substance Use Topics  . Smoking status: Never Smoker   . Smokeless tobacco: Never Used  . Alcohol Use: No   OB History    No data available     Review of Systems  Constitutional: Negative for fever and chills.   HENT: Negative for congestion, ear pain and sore throat.   Eyes: Negative for visual disturbance.  Respiratory: Negative for cough and shortness of breath.   Cardiovascular: Negative for chest pain, palpitations and leg swelling.  Gastrointestinal: Positive for vomiting. Negative for nausea, abdominal pain and diarrhea.  Genitourinary: Negative for dysuria, urgency, frequency, hematuria and difficulty urinating.  Musculoskeletal: Negative for back pain and neck pain.  Skin: Negative for rash.  Neurological: Positive for syncope and facial asymmetry. Negative for dizziness, weakness, light-headedness, numbness and headaches.      Allergies  Review of patient's allergies indicates no known allergies.  Home Medications   Prior to Admission medications   Medication Sig Start Date End Date Taking? Authorizing Provider  bisacodyl (DULCOLAX) 5 MG EC tablet Take 5 mg by mouth daily as needed. For constipation    Historical Provider, MD  cloNIDine (CATAPRES - DOSED IN MG/24 HR) 0.1 mg/24hr patch Place 1 patch onto the skin once a week.    Historical Provider, MD  diltiazem (DILACOR XR) 240 MG 24 hr capsule Take 240 mg by mouth daily.    Historical Provider, MD  dipyridamole-aspirin (AGGRENOX) 25-200 MG per 12 hr capsule Take 1 capsule by mouth 2 (two) times daily.    Historical Provider, MD  hydrALAZINE (APRESOLINE) 25 MG tablet Take 1 tablet (25 mg total) by mouth 3 (three) times daily. 10/25/11   Laveda Norman, MD  lisinopril (PRINIVIL,ZESTRIL) 20 MG tablet Take 20 mg by mouth 2 (two) times daily.    Historical  Provider, MD  rosuvastatin (CRESTOR) 5 MG tablet Take 5 mg by mouth daily.    Historical Provider, MD   BP 119/39 mmHg  Pulse 62  Temp(Src) 97.9 F (36.6 C) (Oral)  Resp 15  Ht 5' (1.524 m)  Wt 170 lb (77.111 kg)  BMI 33.20 kg/m2  SpO2 97% Physical Exam  Constitutional: She appears well-developed and well-nourished. No distress.  Nontoxic appearing.  HENT:  Head: Normocephalic  and atraumatic.  Right Ear: External ear normal.  Left Ear: External ear normal.  Nose: Nose normal.  Mouth/Throat: Oropharynx is clear and moist. No oropharyngeal exudate.  Eyes: Conjunctivae and EOM are normal. Pupils are equal, round, and reactive to light. Right eye exhibits no discharge. Left eye exhibits no discharge.  Neck: Normal range of motion. Neck supple. No JVD present. No tracheal deviation present.  Cardiovascular: Normal rate, regular rhythm, normal heart sounds and intact distal pulses.  Exam reveals no gallop and no friction rub.   No murmur heard. Bilateral radial pulses are intact.  Pulmonary/Chest: Effort normal and breath sounds normal. No respiratory distress. She has no wheezes. She has no rales.  Lungs clear to auscultation bilaterally.  Abdominal: Soft. She exhibits no distension. There is no tenderness. There is no guarding.  Musculoskeletal: She exhibits no edema or tenderness.  Patient has good and equal grip strength bilaterally. Patient is unable to completely raise her right arm which is reportedly due to her previous stroke. Patient has good strength in her bilateral lower extremities. No lower extremity edema or tenderness. No back or neck tenderness.  Lymphadenopathy:    She has no cervical adenopathy.  Neurological: She is alert. Coordination normal.  The patient is alert and oriented x2. She is confused about the year and the previous event.  Sensation is intact her bilateral upper and lower extremities. EOMs are intact. Patient has slightly unequal smile right sided droop with smiling- otherwise cranial nerves are intact. Speech is slightly slurred and she has trouble with word finding at times. Finger-to-nose intact bilaterally.  Skin: Skin is warm and dry. No rash noted. She is not diaphoretic. No erythema. No pallor.  Psychiatric: She has a normal mood and affect. Her behavior is normal.  Nursing note and vitals reviewed.   ED Course  Procedures  (including critical care time) Labs Review Labs Reviewed  URINALYSIS, ROUTINE W REFLEX MICROSCOPIC (NOT AT [facility])  COMPREHENSIVE METABOLIC PANEL  CBC WITH DIFFERENTIAL/PLATELET  I-STAT TROPOININ, ED    Imaging Review No results found.   EKG Interpretation None      Filed Vitals:   11/15/14 1540 11/15/14 1615 11/15/14 1645 11/15/14 1709  BP:  159/50  149/52  Pulse: 69 70  71  Temp:   98 F (36.7 C)   TempSrc:      Resp: Height:      Weight:      SpO2: 95% 99%  99%     MDM   Meds given in ED:  Medications - No data to display  New Prescriptions   No medications on file    Final diagnoses:  Syncope and collapse    This is a 74 y.o. female with a history of a CVA, hypertension, hyperlipidemia and diabetes who presents to the emergency department after a syncopal episode while eating lunch today. EMS reports that the patient passed out after eating lunch and was found confused by EMS who noted right-sided weakness and right facial droop. Family reports that the  patient has a previous right CVA with deficits and chronically has some right facial droop and right arm weakness. EMS believe that the patient's symptoms were worse but slowly resolved during transport. Patient became more oriented and speech became more coherent upon transport by EMS. No seizure like activity noted. EMS reported her initial blood pressure was 88 systolic and her heart rate was in the 60s. By my evaluation the patient has no complaints. The patient does not remember the syncopal episode or any prodromal symptoms.  The patient is afebrile and nontoxic appearing. She is alert and oriented 2. She is confused about the year and the previous events of today. She does have a little bit of slurred speech and right facial droop with smile however this was thought to be chronic from her previous stroke. The patient denies feeling any new numbness, tingling or weakness. Initial troponin is negative. CBC  is unremarkable. She had showed mild diffuse cortical atrophy and mild chronic ischemic white matter disease but no acute intracranial abnormality was identified. This x-ray shows central venous congestion and low lung volumes but no infiltrate. Patient awaiting CMP and urinalysis. Plan is for admission for her syncopal episode. Patient very handed off to [employer], PA-C at shift change. Plan for admission when labs return.   This patient was discussed with and evaluated by Dr. Fayrene Fearing who agrees with assessment and plan.  Everlene Farrier, PA-C 11/15/14 1717  Rolland Porter, MD 11/20/14 [phone]

## 2014-11-15 NOTE — H&P (Signed)
[facility] History and Physical  Patient: [name]  MRN: [id number]  DOB: 1940-09-06  DOS: the patient was seen and examined on 11/15/2014 PCP: Georgann Housekeeper, MD   Chief Complaint: Passing out episode  HPI: [name] is a 74 y.o. female with Past medical history of CVA with residual right-sided weakness as well as facial droop and speech difficulty, hypertension, history of type 2 diabetes mellitus currently not on medication, dyslipidemia. The patient is presenting with complaints of passing out episode. The patient was at her baseline during the day. Later in the evening she went to eat with her friend. After her meal she felt she was asleep and when she woke up she was surrounded by her friends as well as EMS. Reportedly she had an episode of vomiting. Reportedly EMS found that her blood pressure was systolic 80s with some right-sided weakness as well as slurred speech. The time of my evaluation the patient was able to provide history and did not have any acute complaints. She denies any fever or chills denies any chest pain or abdominal pain denies any shortness of breath nausea or vomiting denies any diarrhea or constipation denies any burning urination. Patient mentions she has prior episodes of passing out events when she eats more than her usual.  The patient is coming from home. And at her baseline independent for most of her ADL.  Review of Systems: as mentioned in the history of present illness.  A comprehensive review of the other systems is negative.  Past Medical History  Diagnosis Date  . Stroke   . Hypertension   . Hypercholesteremia   . Diabetes mellitus    Past Surgical History  Procedure Laterality Date  . Tooth extraction     Social History:  reports that she has never smoked. She has never used smokeless tobacco. She reports that she does not drink alcohol or use illicit drugs.  No Known Allergies  Family History  Problem Relation Age of  Onset  . Diabetes type II    . Hypertension      Prior to Admission medications   Medication Sig Start Date End Date Taking? Authorizing Provider  bisacodyl (DULCOLAX) 5 MG EC tablet Take 5 mg by mouth daily as needed. For constipation   Yes Historical Provider, MD  cloNIDine (CATAPRES - DOSED IN MG/24 HR) 0.1 mg/24hr patch Place 1 patch onto the skin once a week.   Yes Historical Provider, MD  diltiazem (DILACOR XR) 240 MG 24 hr capsule Take 240 mg by mouth daily.   Yes Historical Provider, MD  dipyridamole-aspirin (AGGRENOX) 25-200 MG per 12 hr capsule Take 1 capsule by mouth 2 (two) times daily.   Yes Historical Provider, MD  lisinopril (PRINIVIL,ZESTRIL) 20 MG tablet Take 20 mg by mouth 2 (two) times daily.   Yes Historical Provider, MD  rosuvastatin (CRESTOR) 5 MG tablet Take 5 mg by mouth daily.   Yes Historical Provider, MD  hydrALAZINE (APRESOLINE) 25 MG tablet Take 1 tablet (25 mg total) by mouth 3 (three) times daily. Patient not taking: Reported on 11/15/2014 10/25/11   Laveda Norman, MD    Physical Exam: Filed Vitals:   11/15/14 2150 11/15/14 2343 11/15/14 2344 11/15/14 2346  BP: 151/44 138/62 150/51 157/56  Pulse: 61 52 56 61  Temp: 98.6 F (37 C)     TempSrc: Oral     Resp: 18     Height: 5' (1.524 m)     Weight: 66.1 kg (145 lb  11.6 oz)     SpO2: 99%       General: Alert, Awake and Oriented to Time, Place and Person. Appear in mild distress Eyes: PERRL ENT: Oral Mucosa clear moist. Neck: no JVD Cardiovascular: S1 and S2 Present, no Murmur, Peripheral Pulses Present Respiratory: Bilateral Air entry equal and Decreased,  Clear to Auscultation, no Crackles, no wheezes Abdomen: Bowel Sound  present , Soft and non tender Skin: no Rash Extremities: no Pedal edema, no calf tenderness Neurologic: Grossly no focal neuro deficit.  Labs on Admission:  CBC:  Recent Labs Lab 11/15/14 1543  WBC 5.9  NEUTROABS 4.4  HGB 13.3  HCT 41.0  MCV 86.5  PLT 257    CMP       Component Value Date/Time   NA 139 11/15/2014 1631   K 4.1 11/15/2014 1631   CL 107 11/15/2014 1631   CO2 25 11/15/2014 1631   GLUCOSE 142* 11/15/2014 1631   BUN 16 11/15/2014 1631   CREATININE 0.99 11/15/2014 1631   CALCIUM 9.5 11/15/2014 1631   PROT 6.4* 11/15/2014 1631   ALBUMIN 3.6 11/15/2014 1631   AST 21 11/15/2014 1631   ALT 14 11/15/2014 1631   ALKPHOS 59 11/15/2014 1631   BILITOT 0.5 11/15/2014 1631   GFRNONAA 56* 11/15/2014 1631   GFRAA >60 11/15/2014 1631    No results for input(s): LIPASE, AMYLASE in the last 168 hours.  No results for input(s): CKTOTAL, CKMB, CKMBINDEX, TROPONINI in the last 168 hours. BNP (last 3 results) No results for input(s): BNP in the last 8760 hours.  ProBNP (last 3 results) No results for input(s): PROBNP in the last 8760 hours.   Radiological Exams on Admission: Dg Chest 2 View  11/15/2014   CLINICAL DATA:  SyncopePt was at [employer] wit friends, went unresponsive, vomited  EXAM: CHEST  2 VIEW  COMPARISON:  Radiograph 10/22/2011  FINDINGS: Normal cardiac silhouette. Low lung volumes. There is mild central venous pulmonary congestion. No focal infiltrate. No pneumothorax.  IMPRESSION: Central venous congestion low lung volumes.  No infiltrate.   Electronically Signed   By: Genevive Bi M.D.   On: 11/15/2014 15:36   Ct Head Wo Contrast  11/15/2014   CLINICAL DATA:  Loss of consciousness.  EXAM: CT HEAD WITHOUT CONTRAST  TECHNIQUE: Contiguous axial images were obtained from the base of the skull through the vertex without intravenous contrast.  COMPARISON:  MRI scan of Oct 23, 2011.  CT scan of Oct 22, 2011.  FINDINGS: Bony calvarium appears intact. Mild diffuse cortical atrophy is noted. Mild chronic ischemic white matter disease is noted. No mass effect or midline shift is noted. Ventricular size is within normal limits. There is no evidence of mass lesion, hemorrhage or acute infarction.  IMPRESSION: Mild diffuse cortical  atrophy. Mild chronic ischemic white matter disease. No acute intracranial abnormality seen.   Electronically Signed   By: Lupita Raider, M.D.   On: 11/15/2014 16:09   EKG: Independently reviewed. normal sinus rhythm, nonspecific ST and T waves changes.  Assessment/Plan Principal Problem:   Syncope Active Problems:   Hypertension   Diabetes mellitus   CVA (cerebral infarction)   Dehydration   1. Syncope The patient is presenting with complaints of syncopal events. The event occurred after she had her meal. She had an episode of vomiting prior to event. Later on she was found to be hypotensive and was also having some slurred speech as well as increased confusion. With this the patient  is admitted in the hospital. CT head is negative for any acute abnormality. I will obtain routine syncope workup as well as physical therapy. Echocardiogram in the morning. MRI brain in the morning. The patient has passed swallowing test.  2.Essential hypertension Currently only continue her clonidine patch. Holding other blood pressure medications.  3.History of CVA. Continuing Aggrenox.  4. history of diabetes mellitus. Checking hemoglobin A1c.  5. dyslipidemia. Continuing Crestor.   Advance goals of care discussion: full code as per my discussion with patient. Her power of attorney will be [place].   DVT Prophylaxis: subcutaneous Heparin Nutrition: Regular diet   Disposition: Admitted as observation, telemetry unit.  Author: Lynden Oxford, MD [facility] Pager: [phone] 11/15/2014  If 7PM-7AM, please contact night-coverage [email or web address] Password [email or web address]

## 2014-11-16 ENCOUNTER — Observation Stay (HOSPITAL_COMMUNITY): Payer: Commercial Managed Care - HMO

## 2014-11-16 DIAGNOSIS — E119 Type 2 diabetes mellitus without complications: Secondary | ICD-10-CM | POA: Diagnosis not present

## 2014-11-16 DIAGNOSIS — I10 Essential (primary) hypertension: Secondary | ICD-10-CM | POA: Diagnosis not present

## 2014-11-16 DIAGNOSIS — R55 Syncope and collapse: Secondary | ICD-10-CM | POA: Diagnosis not present

## 2014-11-16 DIAGNOSIS — R4781 Slurred speech: Secondary | ICD-10-CM | POA: Diagnosis not present

## 2014-11-16 DIAGNOSIS — I69351 Hemiplegia and hemiparesis following cerebral infarction affecting right dominant side: Secondary | ICD-10-CM | POA: Diagnosis not present

## 2014-11-16 DIAGNOSIS — E785 Hyperlipidemia, unspecified: Secondary | ICD-10-CM | POA: Diagnosis not present

## 2014-11-16 DIAGNOSIS — E860 Dehydration: Secondary | ICD-10-CM | POA: Diagnosis not present

## 2014-11-16 DIAGNOSIS — E780 Pure hypercholesterolemia: Secondary | ICD-10-CM | POA: Diagnosis not present

## 2014-11-16 DIAGNOSIS — R001 Bradycardia, unspecified: Secondary | ICD-10-CM | POA: Diagnosis not present

## 2014-11-16 LAB — CBC WITH DIFFERENTIAL/PLATELET
Basophils Absolute: 0 10*3/uL (ref 0.0–0.1)
Basophils Relative: 0 % (ref 0–1)
EOS ABS: 0 10*3/uL (ref 0.0–0.7)
Eosinophils Relative: 1 % (ref 0–5)
HCT: 34.2 % — ABNORMAL LOW (ref 36.0–46.0)
Hemoglobin: 11.2 g/dL — ABNORMAL LOW (ref 12.0–15.0)
LYMPHS ABS: 1.5 10*3/uL (ref 0.7–4.0)
Lymphocytes Relative: 26 % (ref 12–46)
MCH: 28 pg (ref 26.0–34.0)
MCHC: 32.7 g/dL (ref 30.0–36.0)
MCV: 85.5 fL (ref 78.0–100.0)
MONOS PCT: 9 % (ref 3–12)
Monocytes Absolute: 0.5 10*3/uL (ref 0.1–1.0)
Neutro Abs: 3.6 10*3/uL (ref 1.7–7.7)
Neutrophils Relative %: 64 % (ref 43–77)
Platelets: 216 10*3/uL (ref 150–400)
RBC: 4 MIL/uL (ref 3.87–5.11)
RDW: 14.8 % (ref 11.5–15.5)
WBC: 5.6 10*3/uL (ref 4.0–10.5)

## 2014-11-16 LAB — GLUCOSE, CAPILLARY
GLUCOSE-CAPILLARY: 98 mg/dL (ref 65–99)
Glucose-Capillary: 86 mg/dL (ref 65–99)

## 2014-11-16 LAB — PROTIME-INR
INR: 1.17 (ref 0.00–1.49)
Prothrombin Time: 15 seconds (ref 11.6–15.2)

## 2014-11-16 LAB — COMPREHENSIVE METABOLIC PANEL
ALBUMIN: 3.1 g/dL — AB (ref 3.5–5.0)
ALK PHOS: 55 U/L (ref 38–126)
ALT: 12 U/L — ABNORMAL LOW (ref 14–54)
ANION GAP: 7 (ref 5–15)
AST: 19 U/L (ref 15–41)
BUN: 15 mg/dL (ref 6–20)
CALCIUM: 9 mg/dL (ref 8.9–10.3)
CO2: 26 mmol/L (ref 22–32)
Chloride: 106 mmol/L (ref 101–111)
Creatinine, Ser: 0.99 mg/dL (ref 0.44–1.00)
GFR calc Af Amer: >60 mL/min (ref >60)
GFR calc non Af Amer: 56 mL/min — ABNORMAL LOW (ref >60)
GLUCOSE: 101 mg/dL — AB (ref 65–99)
POTASSIUM: 3.7 mmol/L (ref 3.5–5.1)
Sodium: 139 mmol/L (ref 135–145)
TOTAL PROTEIN: 5.9 g/dL — AB (ref 6.5–8.1)
Total Bilirubin: 0.5 mg/dL (ref 0.3–1.2)

## 2014-11-16 MED ORDER — ASPIRIN-DIPYRIDAMOLE ER 25-200 MG PO CP12
1.0000 | ORAL_CAPSULE | Freq: Two times a day (BID) | ORAL | Status: DC
Start: 2014-11-16 — End: 2014-11-17
  Administered 2014-11-16 – 2014-11-17 (×4): 1 via ORAL
  Filled 2014-11-16 (×4): qty 1

## 2014-11-16 MED ORDER — ROSUVASTATIN CALCIUM 5 MG PO TABS
5.0000 mg | ORAL_TABLET | Freq: Every day | ORAL | Status: DC
  Administered 2014-11-16 – 2014-11-17 (×2): 5 mg via ORAL
  Filled 2014-11-16 (×2): qty 1

## 2014-11-16 MED ORDER — CLONIDINE HCL 0.1 MG/24HR TD PTWK: 0.1000 mg | MEDICATED_PATCH | TRANSDERMAL | Status: DC

## 2014-11-16 NOTE — Progress Notes (Signed)
PROGRESS NOTE  [name]:[id number] DOB: 1940-12-26 DOA: 11/15/2014 PCP: Georgann Housekeeper, MD  Assessment/Plan: Syncope having some slurred speech as well as increased confusion. CT head is negative for any acute abnormality. Echocardiogram MRI brain PT- home health  Essential hypertension Currently only continue her clonidine patch. Holding other blood pressure medications.  History of CVA. Continuing Aggrenox.  history of diabetes mellitus. Checking hemoglobin A1c.  dyslipidemia. Continuing Crestor.   Code Status: full Family Communication: patient Disposition Plan:    Consultants:  neuro  Procedures:      HPI/Subjective: No SOB, no CP No fever, no chills  Objective: Filed Vitals:   11/16/14 1003  BP: 143/48  Pulse: 50  Temp: 98.4 F (36.9 C)  Resp: 18   No intake or output data in the 24 hours ending 11/16/14 1102 Filed Weights   11/15/14 1452 11/15/14 2150 11/16/14 0635  Weight: 77.111 kg (170 lb) 66.1 kg (145 lb 11.6 oz) 69 kg (152 lb 1.9 oz)    Exam:   General:  Slurred speech  Cardiovascular: rrr  Respiratory: clear  Abdomen: +BS, soft  Musculoskeletal: no edema   Data Reviewed: Basic Metabolic Panel:  Recent Labs Lab 11/15/14 1631 11/16/14 0542  NA 139 139  K 4.1 3.7  CL 107 106  CO2 25 26  GLUCOSE 142* 101*  BUN 16 15  CREATININE 0.99 0.99  CALCIUM 9.5 9.0   Liver Function Tests:  Recent Labs Lab 11/15/14 1631 11/16/14 0542  AST 21 19  ALT 14 12*  ALKPHOS 59 55  BILITOT 0.5 0.5  PROT 6.4* 5.9*  ALBUMIN 3.6 3.1*   No results for input(s): LIPASE, AMYLASE in the last 168 hours. No results for input(s): AMMONIA in the last 168 hours. CBC:  Recent Labs Lab 11/15/14 1543 11/16/14 0542  WBC 5.9 5.6  NEUTROABS 4.4 3.6  HGB 13.3 11.2*  HCT 41.0 34.2*  MCV 86.5 85.5  PLT 257 216   Cardiac Enzymes: No results for input(s): CKTOTAL, CKMB, CKMBINDEX, TROPONINI in the last 168 hours. BNP (last 3  results) No results for input(s): BNP in the last 8760 hours.  ProBNP (last 3 results) No results for input(s): PROBNP in the last 8760 hours.  CBG:  Recent Labs Lab 11/15/14 2340 11/16/14 0634  GLUCAP 86 98    No results found for this or any previous visit (from the past 240 hour(s)).   Studies: Dg Chest 2 View  11/15/2014   CLINICAL DATA:  SyncopePt was at [employer] wit friends, went unresponsive, vomited  EXAM: CHEST  2 VIEW  COMPARISON:  Radiograph 10/22/2011  FINDINGS: Normal cardiac silhouette. Low lung volumes. There is mild central venous pulmonary congestion. No focal infiltrate. No pneumothorax.  IMPRESSION: Central venous congestion low lung volumes.  No infiltrate.   Electronically Signed   By: Genevive Bi M.D.   On: 11/15/2014 15:36   Ct Head Wo Contrast  11/15/2014   CLINICAL DATA:  Loss of consciousness.  EXAM: CT HEAD WITHOUT CONTRAST  TECHNIQUE: Contiguous axial images were obtained from the base of the skull through the vertex without intravenous contrast.  COMPARISON:  MRI scan of Oct 23, 2011.  CT scan of Oct 22, 2011.  FINDINGS: Bony calvarium appears intact. Mild diffuse cortical atrophy is noted. Mild chronic ischemic white matter disease is noted. No mass effect or midline shift is noted. Ventricular size is within normal limits. There is no evidence of mass lesion, hemorrhage or acute infarction.  IMPRESSION: Mild diffuse  cortical atrophy. Mild chronic ischemic white matter disease. No acute intracranial abnormality seen.   Electronically Signed   By: Lupita Raider, M.D.   On: 11/15/2014 16:09    Scheduled Meds: . [START ON 11/22/2014] cloNIDine  0.1 mg Transdermal Weekly  . dipyridamole-aspirin  1 capsule Oral BID  . heparin  5,000 Units Subcutaneous 3 times per day  . rosuvastatin  5 mg Oral Daily  . sodium chloride  3 mL Intravenous Q12H   Continuous Infusions:  Antibiotics Given (last 72 hours)    None      Principal Problem:    Syncope Active Problems:   Hypertension   Diabetes mellitus   CVA (cerebral infarction)   Dehydration    Time spent: 25 min    Cherrie Franca  [facility] Pager [phone]. If 7PM-7AM, please contact night-coverage at [email or web address], password [facility] 11/16/2014, 11:02 AM

## 2014-11-16 NOTE — Evaluation (Signed)
Physical Therapy Evaluation Patient Details Name: [name] MRN: [id number] DOB: 21-Feb-1941 Today's Date: 11/16/2014   History of Present Illness  [name] is a 74 y.o. female with Past medical history of CVA with residual right-sided weakness as well as facial droop and speech difficulty, hypertension, history of type 2 diabetes mellitus currently not on medication, dyslipidemia.  The patient is presenting with complaints of passing out episode.  Clinical Impression  Patient presents with decreased mobility due to deficits listed in PT problem list.  She will benefit from skilled PT in the acute setting to allow return home with aide assist.  Reports aide at night, but can increase number of hours as needed.  Feel for safety will need daytime hours as well.  Patient agrees off from baseline "slower" per her report.  Patient is fall risk and, though out of normal environment without typical equipment needed assist for safety with mobility today.    Follow Up Recommendations Home health PT;Supervision - Intermittent    Equipment Recommendations  None recommended by PT    Recommendations for Other Services       Precautions / Restrictions Precautions Precautions: Fall      Mobility  Bed Mobility Overal bed mobility: Needs Assistance Bed Mobility: Supine to Sit     Supine to sit: Min assist;HOB elevated     General bed mobility comments: used rail, difficulty scooting out of middle of the bed, assist with lifting trunk and scooting hips  Transfers Overall transfer level: Needs assistance Equipment used: Rolling walker (2 wheeled) Transfers: Sit to/from Stand Sit to Stand: Min assist         General transfer comment: supervision from [facility] over toilet, min assist from bed  Ambulation/Gait Ambulation/Gait assistance: Min guard Ambulation Distance (Feet): 12 Feet (x 2) Assistive device: Rolling walker (2 wheeled) Gait Pattern/deviations: Decreased stride  length;Shuffle;Trunk flexed;Step-to pattern     General Gait Details: assist wtih walker stability (pt used to 4 wheeled walker,) cues to pick up feet.  Stairs            Wheelchair Mobility    Modified Rankin (Stroke Patients Only)       Balance Overall balance assessment: Needs assistance Sitting-balance support: Feet unsupported Sitting balance-Leahy Scale: Fair Sitting balance - Comments: leans back with MMT for LE's   Standing balance support: Bilateral upper extremity supported Standing balance-Leahy Scale: Poor Standing balance comment: standint to wash hands minguard for safety                             Pertinent Vitals/Pain Pain Assessment: No/denies pain    Home Living Family/patient expects to be discharged to:: Private residence Living Arrangements: Alone Available Help at Discharge: Personal care attendant Type of Home: House Home Access: Stairs to enter Entrance Stairs-Rails: None Entrance Stairs-Number of Steps: 1 Home Layout: One level Home Equipment: Emergency planning/management officer - 4 wheels;Grab bars - tub/shower;Hand held shower head;Bedside commode      Prior Function Level of Independence: Needs assistance   Gait / Transfers Assistance Needed: independent with rollator  ADL's / Homemaking Assistance Needed: assist for meals and showers        Hand Dominance   Dominant Hand: Right    Extremity/Trunk Assessment               Lower Extremity Assessment: Generalized weakness;RLE deficits/detail RLE Deficits / Details: increased extensor tone in right LE  Communication   Communication: Expressive difficulties (dysarthtic)  Cognition Arousal/Alertness: Awake/alert Behavior During Therapy: WFL for tasks assessed/performed Overall Cognitive Status: Within Functional Limits for tasks assessed                      General Comments      Exercises        Assessment/Plan    PT Assessment Patient needs  continued PT services  PT Diagnosis Abnormality of gait;Generalized weakness   PT Problem List Decreased strength;Decreased balance;Decreased activity tolerance;Decreased mobility;Decreased safety awareness  PT Treatment Interventions DME instruction;Therapeutic exercise;Gait training;Balance training;Functional mobility training;Therapeutic activities;Patient/family education   PT Goals (Current goals can be found in the Care Plan section) Acute Rehab PT Goals Patient Stated Goal: To go home PT Goal Formulation: With patient Time For Goal Achievement: 11/23/14 Potential to Achieve Goals: Good    Frequency Min 3X/week   Barriers to discharge Decreased caregiver support states can increased number of hours has aide at home    Co-evaluation               End of Session   Activity Tolerance: Patient tolerated treatment well Patient left: in chair;with call bell/phone within reach      Functional Assessment Tool Used: clinical judgement Functional Limitation: Mobility: Walking and moving around Mobility: Walking and Moving Around Current Status (W1191): At least 20 percent but less than 40 percent impaired, limited or restricted Mobility: Walking and Moving Around Goal Status [phone]): At least 1 percent but less than 20 percent impaired, limited or restricted    Time: 0900-0935 PT Time Calculation (min) (ACUTE ONLY): 35 min   Charges:     PT Treatments $Gait Training: 8-22 mins   PT G Codes:   PT G-Codes **NOT FOR INPATIENT CLASS** Functional Assessment Tool Used: clinical judgement Functional Limitation: Mobility: Walking and moving around Mobility: Walking and Moving Around Current Status (F6213): At least 20 percent but less than 40 percent impaired, limited or restricted Mobility: Walking and Moving Around Goal Status [phone]): At least 1 percent but less than 20 percent impaired, limited or restricted    [facility] 11/16/2014, 9:43 AM  Sheran Lawless,  PT [phone] 11/16/2014

## 2014-11-17 ENCOUNTER — Observation Stay (HOSPITAL_COMMUNITY): Payer: Commercial Managed Care - HMO

## 2014-11-17 DIAGNOSIS — R4781 Slurred speech: Secondary | ICD-10-CM | POA: Diagnosis not present

## 2014-11-17 DIAGNOSIS — E860 Dehydration: Secondary | ICD-10-CM | POA: Diagnosis not present

## 2014-11-17 DIAGNOSIS — R001 Bradycardia, unspecified: Secondary | ICD-10-CM

## 2014-11-17 DIAGNOSIS — E780 Pure hypercholesterolemia: Secondary | ICD-10-CM | POA: Diagnosis not present

## 2014-11-17 DIAGNOSIS — R55 Syncope and collapse: Secondary | ICD-10-CM | POA: Diagnosis not present

## 2014-11-17 DIAGNOSIS — I10 Essential (primary) hypertension: Secondary | ICD-10-CM | POA: Diagnosis not present

## 2014-11-17 DIAGNOSIS — E119 Type 2 diabetes mellitus without complications: Secondary | ICD-10-CM | POA: Diagnosis not present

## 2014-11-17 DIAGNOSIS — E785 Hyperlipidemia, unspecified: Secondary | ICD-10-CM | POA: Diagnosis not present

## 2014-11-17 DIAGNOSIS — I69351 Hemiplegia and hemiparesis following cerebral infarction affecting right dominant side: Secondary | ICD-10-CM | POA: Diagnosis not present

## 2014-11-17 LAB — GLUCOSE, CAPILLARY: Glucose-Capillary: 99 mg/dL (ref 65–99)

## 2014-11-17 MED ORDER — HYDRALAZINE HCL 25 MG PO TABS
25.0000 mg | ORAL_TABLET | Freq: Three times a day (TID) | ORAL | Status: DC
  Administered 2014-11-17: 25 mg via ORAL
  Filled 2014-11-17: qty 1

## 2014-11-17 MED ORDER — LISINOPRIL 20 MG PO TABS
20.0000 mg | ORAL_TABLET | Freq: Two times a day (BID) | ORAL | Status: DC
  Administered 2014-11-17: 20 mg via ORAL
  Filled 2014-11-17: qty 1

## 2014-11-17 MED ORDER — HYDRALAZINE HCL 25 MG PO TABS: 25.0000 mg | ORAL_TABLET | Freq: Three times a day (TID) | ORAL | Status: DC

## 2014-11-17 NOTE — Progress Notes (Signed)
  Echocardiogram 2D Echocardiogram has been performed.  Arvil Chaco 11/17/2014, 9:42 AM

## 2014-11-17 NOTE — Care Management Note (Signed)
Case Management Note  Patient Details  Name: [name] MRN: [id number] Date of Birth: [birth year]  Subjective/Objective:                   Chief Complaint: Passing out episode Action/Plan:  discharge planning Expected Discharge Date:  11/17/14               Expected Discharge Plan:  [place]  In-House Referral:     Discharge planning Services  CM Consult  Post Acute Care Choice:  Home Health Choice offered to:  Patient  DME Arranged:    DME Agency:     HH Arranged:  PT, OT [place] Agency:  [place]  Status of Service:  Completed, signed off  Medicare Important Message Given:    Date Medicare IM Given:    Medicare IM give by:    Date Additional Medicare IM Given:    Additional Medicare Important Message give by:     If discussed at [place] of Stay Meetings, dates discussed:    Additional Comments: CM met with pt in room to offer choice of home health agency.  Pt chooses [facility] to render HHPT/OT.  Address and contact information verified by pt.  Referral called to [facility] rep, Tiffany.  No other CM needs were communicated. Dellie Catholic, RN 11/17/2014, 11:27 AM

## 2014-11-17 NOTE — Progress Notes (Signed)
Patient DC'd home via car with friends.  DC instructions and prescription given and both fully understood.  Vital signs and assessments were stable.

## 2014-11-17 NOTE — Discharge Summary (Signed)
Physician Discharge Summary  [name] ZOX:[id number] DOB: 08/20/1940 DOA: 11/15/2014  PCP: Georgann Housekeeper, MD  Admit date: 11/15/2014 Discharge date: 11/17/2014  Time spent: 35 minutes  Recommendations for Outpatient Follow-up:  1. HgbA1C 2. If has further bradycardia- may need follow up with cards and event monitor  Discharge Diagnoses:  Principal Problem:   Syncope Active Problems:   Hypertension   Diabetes mellitus   CVA (cerebral infarction)   Dehydration   Discharge Condition: improved  Diet recommendation: cardiac  Filed Weights   11/15/14 2150 11/16/14 0635 11/17/14 0600  Weight: 66.1 kg (145 lb 11.6 oz) 69 kg (152 lb 1.9 oz) 67.7 kg (149 lb 4 oz)    History of present illness:  [name] is a 74 y.o. female with Past medical history of CVA with residual right-sided weakness as well as facial droop and speech difficulty, hypertension, history of type 2 diabetes mellitus currently not on medication, dyslipidemia. The patient is presenting with complaints of passing out episode. The patient was at her baseline during the day. Later in the evening she went to eat with her friend. After her meal she felt she was asleep and when she woke up she was surrounded by her friends as well as EMS. Reportedly she had an episode of vomiting. Reportedly EMS found that her blood pressure was systolic 80s with some right-sided weakness as well as slurred speech. The time of my evaluation the patient was able to provide history and did not have any acute complaints. She denies any fever or chills denies any chest pain or abdominal pain denies any shortness of breath nausea or vomiting denies any diarrhea or constipation denies any burning urination. Patient mentions she has prior episodes of passing out events when she eats more than her usual.  The patient is coming from home. And at her baseline independent for most of her ADL.  Hospital Course:  Syncope- suspect vasovagal  after vomiting but history not clear -had similar episode in 2013- EEG done then with no findings CT head is negative for any acute abnormality. Echocardiogram-unremarkable MRI brain negative PT- home health  Bradycardia- sinus- no pauses on tele -while sleeping- will change PO medications for HTN  Essential hypertension Change to PO hydralazine and d/c all rate lowering meds  History of CVA. Continuing Aggrenox.  history of diabetes mellitus. -blood sugar controlled  dyslipidemia. Continuing Crestor.    Procedures:    Consultations:    Discharge Exam: Filed Vitals:   11/17/14 1003  BP: 168/47  Pulse: 48  Temp: 98.9 F (37.2 C)  Resp: 18    General: dysphagia Cardiovascular: rrr Respiratory: clear  Discharge Instructions   Discharge Instructions    Diet - low sodium heart healthy    Complete by:  As directed      Discharge instructions    Complete by:  As directed   Home health No driving     Increase activity slowly    Complete by:  As directed           Current Discharge Medication List    CONTINUE these medications which have CHANGED   Details  hydrALAZINE (APRESOLINE) 25 MG tablet Take 1 tablet (25 mg total) by mouth every 8 (eight) hours. Qty: 90 tablet, Refills: 0      CONTINUE these medications which have NOT CHANGED   Details  bisacodyl (DULCOLAX) 5 MG EC tablet Take 5 mg by mouth daily as needed. For constipation    dipyridamole-aspirin (AGGRENOX) 25-200  MG per 12 hr capsule Take 1 capsule by mouth 2 (two) times daily.    lisinopril (PRINIVIL,ZESTRIL) 20 MG tablet Take 20 mg by mouth 2 (two) times daily.    rosuvastatin (CRESTOR) 5 MG tablet Take 5 mg by mouth daily.      STOP taking these medications     cloNIDine (CATAPRES - DOSED IN MG/24 HR) 0.1 mg/24hr patch      diltiazem (DILACOR XR) 240 MG 24 hr capsule        No Known Allergies Follow-up Information    Follow up with [facility].   Why:   home health physical and occupational therapy   Contact information:   [address] [place] [address] [phone]       Follow up with Georgann Housekeeper, MD In 1 week.   Specialty:  Internal Medicine   Why:  for BP check   Contact information:   [address] [place] [address] [phone]        The results of significant diagnostics from this hospitalization (including imaging, microbiology, ancillary and laboratory) are listed below for reference.    Significant Diagnostic Studies: Dg Chest 2 View  11/15/2014   CLINICAL DATA:  SyncopePt was at [employer] wit friends, went unresponsive, vomited  EXAM: CHEST  2 VIEW  COMPARISON:  Radiograph 10/22/2011  FINDINGS: Normal cardiac silhouette. Low lung volumes. There is mild central venous pulmonary congestion. No focal infiltrate. No pneumothorax.  IMPRESSION: Central venous congestion low lung volumes.  No infiltrate.   Electronically Signed   By: Genevive Bi M.D.   On: 11/15/2014 15:36   Ct Head Wo Contrast  11/15/2014   CLINICAL DATA:  Loss of consciousness.  EXAM: CT HEAD WITHOUT CONTRAST  TECHNIQUE: Contiguous axial images were obtained from the base of the skull through the vertex without intravenous contrast.  COMPARISON:  MRI scan of Oct 23, 2011.  CT scan of Oct 22, 2011.  FINDINGS: Bony calvarium appears intact. Mild diffuse cortical atrophy is noted. Mild chronic ischemic white matter disease is noted. No mass effect or midline shift is noted. Ventricular size is within normal limits. There is no evidence of mass lesion, hemorrhage or acute infarction.  IMPRESSION: Mild diffuse cortical atrophy. Mild chronic ischemic white matter disease. No acute intracranial abnormality seen.   Electronically Signed   By: Lupita Raider, M.D.   On: 11/15/2014 16:09   [name]  11/16/2014   CLINICAL DATA:  Syncope.  Slurred speech.  History of CVA.  EXAM: MRI HEAD WITHOUT CONTRAST   TECHNIQUE: Multiplanar, multiecho pulse sequences of the brain and surrounding structures were obtained without intravenous contrast.  COMPARISON:  MRI 10/23/2011  FINDINGS: Negative for acute infarct  Moderate atrophy. Moderate chronic microvascular ischemic change in the white matter. Numerous chronic lacunar infarctions in the basal ganglia bilaterally. Chronic infarct left pons and left cerebellum.  Chronic micro hemorrhage in the left pons. Negative for mass or edema. No shift of the midline structures.  Minimal mucosal edema paranasal sinuses.  Pituitary normal in size.  IMPRESSION: Atrophy and chronic ischemic changes as above. No acute abnormality.   Electronically Signed   By: Marlan Palau M.D.   On: 11/16/2014 12:33    Microbiology: No results found for this or any previous visit (from the past 240 hour(s)).   Labs: Basic Metabolic Panel:  Recent Labs Lab 11/15/14 1631 11/16/14 0542  NA 139 139  K 4.1  3.7  CL 107 106  CO2 25 26  GLUCOSE 142* 101*  BUN 16 15  CREATININE 0.99 0.99  CALCIUM 9.5 9.0   Liver Function Tests:  Recent Labs Lab 11/15/14 1631 11/16/14 0542  AST 21 19  ALT 14 12*  ALKPHOS 59 55  BILITOT 0.5 0.5  PROT 6.4* 5.9*  ALBUMIN 3.6 3.1*   No results for input(s): LIPASE, AMYLASE in the last 168 hours. No results for input(s): AMMONIA in the last 168 hours. CBC:  Recent Labs Lab 11/15/14 1543 11/16/14 0542  WBC 5.9 5.6  NEUTROABS 4.4 3.6  HGB 13.3 11.2*  HCT 41.0 34.2*  MCV 86.5 85.5  PLT 257 216   Cardiac Enzymes: No results for input(s): CKTOTAL, CKMB, CKMBINDEX, TROPONINI in the last 168 hours. BNP: BNP (last 3 results) No results for input(s): BNP in the last 8760 hours.  ProBNP (last 3 results) No results for input(s): PROBNP in the last 8760 hours.  CBG:  Recent Labs Lab 11/15/14 2340 11/16/14 0634 11/17/14 0516  GLUCAP 86 98 99       Signed:  Charlestine Rookstool  [facility] 11/17/2014, 12:22 PM

## 2014-11-17 NOTE — Progress Notes (Signed)
OT Cancellation Note  Patient Details Name: [name] MRN: [id number] DOB: Oct 18, 1940   Cancelled Treatment:    Reason Eval/Treat Not Completed: Other (comment). Spoke with case manager and pt being discharged today and is getting set up with HHPT and HHOT. Will defer all further OT needs to next venue of care.  Earlie Raveling OTR/L [phone] 11/17/2014, 11:29 AM

## 2014-11-18 LAB — HEMOGLOBIN A1C
HEMOGLOBIN A1C: 5.7 % — AB (ref 4.8–5.6)
MEAN PLASMA GLUCOSE: 117 mg/dL

## 2014-11-22 DIAGNOSIS — I69351 Hemiplegia and hemiparesis following cerebral infarction affecting right dominant side: Secondary | ICD-10-CM | POA: Diagnosis not present

## 2014-11-22 DIAGNOSIS — I10 Essential (primary) hypertension: Secondary | ICD-10-CM | POA: Diagnosis not present

## 2014-11-22 DIAGNOSIS — E119 Type 2 diabetes mellitus without complications: Secondary | ICD-10-CM | POA: Diagnosis not present

## 2014-11-25 DIAGNOSIS — I10 Essential (primary) hypertension: Secondary | ICD-10-CM | POA: Diagnosis not present

## 2014-11-25 DIAGNOSIS — E119 Type 2 diabetes mellitus without complications: Secondary | ICD-10-CM | POA: Diagnosis not present

## 2014-11-25 DIAGNOSIS — I69351 Hemiplegia and hemiparesis following cerebral infarction affecting right dominant side: Secondary | ICD-10-CM | POA: Diagnosis not present

## 2014-11-26 DIAGNOSIS — M6289 Other specified disorders of muscle: Secondary | ICD-10-CM | POA: Diagnosis not present

## 2014-11-26 DIAGNOSIS — N182 Chronic kidney disease, stage 2 (mild): Secondary | ICD-10-CM | POA: Diagnosis not present

## 2014-11-26 DIAGNOSIS — I639 Cerebral infarction, unspecified: Secondary | ICD-10-CM | POA: Diagnosis not present

## 2014-11-26 DIAGNOSIS — E780 Pure hypercholesterolemia: Secondary | ICD-10-CM | POA: Diagnosis not present

## 2014-11-26 DIAGNOSIS — E11329 Type 2 diabetes mellitus with mild nonproliferative diabetic retinopathy without macular edema: Secondary | ICD-10-CM | POA: Diagnosis not present

## 2014-11-26 DIAGNOSIS — I10 Essential (primary) hypertension: Secondary | ICD-10-CM | POA: Diagnosis not present

## 2014-11-27 DIAGNOSIS — I69351 Hemiplegia and hemiparesis following cerebral infarction affecting right dominant side: Secondary | ICD-10-CM | POA: Diagnosis not present

## 2014-11-27 DIAGNOSIS — I10 Essential (primary) hypertension: Secondary | ICD-10-CM | POA: Diagnosis not present

## 2014-11-27 DIAGNOSIS — E119 Type 2 diabetes mellitus without complications: Secondary | ICD-10-CM | POA: Diagnosis not present

## 2014-11-28 DIAGNOSIS — I69351 Hemiplegia and hemiparesis following cerebral infarction affecting right dominant side: Secondary | ICD-10-CM | POA: Diagnosis not present

## 2014-11-28 DIAGNOSIS — I10 Essential (primary) hypertension: Secondary | ICD-10-CM | POA: Diagnosis not present

## 2014-11-28 DIAGNOSIS — E119 Type 2 diabetes mellitus without complications: Secondary | ICD-10-CM | POA: Diagnosis not present

## 2014-12-03 DIAGNOSIS — I10 Essential (primary) hypertension: Secondary | ICD-10-CM | POA: Diagnosis not present

## 2014-12-03 DIAGNOSIS — E119 Type 2 diabetes mellitus without complications: Secondary | ICD-10-CM | POA: Diagnosis not present

## 2014-12-03 DIAGNOSIS — I69351 Hemiplegia and hemiparesis following cerebral infarction affecting right dominant side: Secondary | ICD-10-CM | POA: Diagnosis not present

## 2014-12-04 DIAGNOSIS — I69351 Hemiplegia and hemiparesis following cerebral infarction affecting right dominant side: Secondary | ICD-10-CM | POA: Diagnosis not present

## 2014-12-04 DIAGNOSIS — I10 Essential (primary) hypertension: Secondary | ICD-10-CM | POA: Diagnosis not present

## 2014-12-04 DIAGNOSIS — E119 Type 2 diabetes mellitus without complications: Secondary | ICD-10-CM | POA: Diagnosis not present

## 2014-12-05 DIAGNOSIS — I10 Essential (primary) hypertension: Secondary | ICD-10-CM | POA: Diagnosis not present

## 2014-12-05 DIAGNOSIS — I69351 Hemiplegia and hemiparesis following cerebral infarction affecting right dominant side: Secondary | ICD-10-CM | POA: Diagnosis not present

## 2014-12-05 DIAGNOSIS — E119 Type 2 diabetes mellitus without complications: Secondary | ICD-10-CM | POA: Diagnosis not present

## 2014-12-06 DIAGNOSIS — E119 Type 2 diabetes mellitus without complications: Secondary | ICD-10-CM | POA: Diagnosis not present

## 2014-12-06 DIAGNOSIS — I10 Essential (primary) hypertension: Secondary | ICD-10-CM | POA: Diagnosis not present

## 2014-12-06 DIAGNOSIS — I69351 Hemiplegia and hemiparesis following cerebral infarction affecting right dominant side: Secondary | ICD-10-CM | POA: Diagnosis not present

## 2014-12-10 DIAGNOSIS — E119 Type 2 diabetes mellitus without complications: Secondary | ICD-10-CM | POA: Diagnosis not present

## 2014-12-10 DIAGNOSIS — I69351 Hemiplegia and hemiparesis following cerebral infarction affecting right dominant side: Secondary | ICD-10-CM | POA: Diagnosis not present

## 2014-12-10 DIAGNOSIS — I10 Essential (primary) hypertension: Secondary | ICD-10-CM | POA: Diagnosis not present

## 2014-12-11 DIAGNOSIS — I10 Essential (primary) hypertension: Secondary | ICD-10-CM | POA: Diagnosis not present

## 2014-12-11 DIAGNOSIS — I69351 Hemiplegia and hemiparesis following cerebral infarction affecting right dominant side: Secondary | ICD-10-CM | POA: Diagnosis not present

## 2014-12-11 DIAGNOSIS — E119 Type 2 diabetes mellitus without complications: Secondary | ICD-10-CM | POA: Diagnosis not present

## 2014-12-12 DIAGNOSIS — E119 Type 2 diabetes mellitus without complications: Secondary | ICD-10-CM | POA: Diagnosis not present

## 2014-12-12 DIAGNOSIS — I69351 Hemiplegia and hemiparesis following cerebral infarction affecting right dominant side: Secondary | ICD-10-CM | POA: Diagnosis not present

## 2014-12-12 DIAGNOSIS — I10 Essential (primary) hypertension: Secondary | ICD-10-CM | POA: Diagnosis not present

## 2014-12-16 DIAGNOSIS — I69351 Hemiplegia and hemiparesis following cerebral infarction affecting right dominant side: Secondary | ICD-10-CM | POA: Diagnosis not present

## 2014-12-16 DIAGNOSIS — I10 Essential (primary) hypertension: Secondary | ICD-10-CM | POA: Diagnosis not present

## 2014-12-16 DIAGNOSIS — E119 Type 2 diabetes mellitus without complications: Secondary | ICD-10-CM | POA: Diagnosis not present

## 2014-12-17 DIAGNOSIS — I10 Essential (primary) hypertension: Secondary | ICD-10-CM | POA: Diagnosis not present

## 2014-12-17 DIAGNOSIS — I69351 Hemiplegia and hemiparesis following cerebral infarction affecting right dominant side: Secondary | ICD-10-CM | POA: Diagnosis not present

## 2014-12-17 DIAGNOSIS — E119 Type 2 diabetes mellitus without complications: Secondary | ICD-10-CM | POA: Diagnosis not present

## 2014-12-18 DIAGNOSIS — I10 Essential (primary) hypertension: Secondary | ICD-10-CM | POA: Diagnosis not present

## 2014-12-18 DIAGNOSIS — E119 Type 2 diabetes mellitus without complications: Secondary | ICD-10-CM | POA: Diagnosis not present

## 2014-12-18 DIAGNOSIS — I69351 Hemiplegia and hemiparesis following cerebral infarction affecting right dominant side: Secondary | ICD-10-CM | POA: Diagnosis not present

## 2014-12-23 DIAGNOSIS — E119 Type 2 diabetes mellitus without complications: Secondary | ICD-10-CM | POA: Diagnosis not present

## 2014-12-23 DIAGNOSIS — I10 Essential (primary) hypertension: Secondary | ICD-10-CM | POA: Diagnosis not present

## 2014-12-23 DIAGNOSIS — I69351 Hemiplegia and hemiparesis following cerebral infarction affecting right dominant side: Secondary | ICD-10-CM | POA: Diagnosis not present

## 2014-12-24 DIAGNOSIS — I69351 Hemiplegia and hemiparesis following cerebral infarction affecting right dominant side: Secondary | ICD-10-CM | POA: Diagnosis not present

## 2014-12-24 DIAGNOSIS — I10 Essential (primary) hypertension: Secondary | ICD-10-CM | POA: Diagnosis not present

## 2014-12-24 DIAGNOSIS — E119 Type 2 diabetes mellitus without complications: Secondary | ICD-10-CM | POA: Diagnosis not present

## 2014-12-26 DIAGNOSIS — I10 Essential (primary) hypertension: Secondary | ICD-10-CM | POA: Diagnosis not present

## 2014-12-26 DIAGNOSIS — I69351 Hemiplegia and hemiparesis following cerebral infarction affecting right dominant side: Secondary | ICD-10-CM | POA: Diagnosis not present

## 2014-12-26 DIAGNOSIS — E119 Type 2 diabetes mellitus without complications: Secondary | ICD-10-CM | POA: Diagnosis not present

## 2014-12-31 DIAGNOSIS — I69351 Hemiplegia and hemiparesis following cerebral infarction affecting right dominant side: Secondary | ICD-10-CM | POA: Diagnosis not present

## 2014-12-31 DIAGNOSIS — I10 Essential (primary) hypertension: Secondary | ICD-10-CM | POA: Diagnosis not present

## 2014-12-31 DIAGNOSIS — E119 Type 2 diabetes mellitus without complications: Secondary | ICD-10-CM | POA: Diagnosis not present

## 2015-01-01 DIAGNOSIS — I69351 Hemiplegia and hemiparesis following cerebral infarction affecting right dominant side: Secondary | ICD-10-CM | POA: Diagnosis not present

## 2015-01-01 DIAGNOSIS — E119 Type 2 diabetes mellitus without complications: Secondary | ICD-10-CM | POA: Diagnosis not present

## 2015-01-01 DIAGNOSIS — I10 Essential (primary) hypertension: Secondary | ICD-10-CM | POA: Diagnosis not present

## 2015-01-07 DIAGNOSIS — E119 Type 2 diabetes mellitus without complications: Secondary | ICD-10-CM | POA: Diagnosis not present

## 2015-01-07 DIAGNOSIS — I69351 Hemiplegia and hemiparesis following cerebral infarction affecting right dominant side: Secondary | ICD-10-CM | POA: Diagnosis not present

## 2015-01-07 DIAGNOSIS — I10 Essential (primary) hypertension: Secondary | ICD-10-CM | POA: Diagnosis not present

## 2015-01-13 ENCOUNTER — Encounter (HOSPITAL_COMMUNITY): Payer: Self-pay

## 2015-01-13 ENCOUNTER — Emergency Department (HOSPITAL_COMMUNITY): Payer: Commercial Managed Care - HMO

## 2015-01-13 ENCOUNTER — Observation Stay (HOSPITAL_COMMUNITY)
Admission: EM | Admit: 2015-01-13 | Discharge: 2015-01-17 | Disposition: A | Discharge status: Skilled Nursing Facility | Payer: Commercial Managed Care - HMO | Attending: Internal Medicine | Admitting: Internal Medicine

## 2015-01-13 DIAGNOSIS — E785 Hyperlipidemia, unspecified: Secondary | ICD-10-CM | POA: Diagnosis not present

## 2015-01-13 DIAGNOSIS — E860 Dehydration: Secondary | ICD-10-CM

## 2015-01-13 DIAGNOSIS — E780 Pure hypercholesterolemia: Secondary | ICD-10-CM | POA: Diagnosis not present

## 2015-01-13 DIAGNOSIS — Z8673 Personal history of transient ischemic attack (TIA), and cerebral infarction without residual deficits: Secondary | ICD-10-CM | POA: Diagnosis not present

## 2015-01-13 DIAGNOSIS — I69351 Hemiplegia and hemiparesis following cerebral infarction affecting right dominant side: Secondary | ICD-10-CM | POA: Insufficient documentation

## 2015-01-13 DIAGNOSIS — R401 Stupor: Secondary | ICD-10-CM | POA: Diagnosis not present

## 2015-01-13 DIAGNOSIS — N390 Urinary tract infection, site not specified: Secondary | ICD-10-CM

## 2015-01-13 DIAGNOSIS — R531 Weakness: Secondary | ICD-10-CM | POA: Diagnosis not present

## 2015-01-13 DIAGNOSIS — R569 Unspecified convulsions: Secondary | ICD-10-CM | POA: Insufficient documentation

## 2015-01-13 DIAGNOSIS — E119 Type 2 diabetes mellitus without complications: Secondary | ICD-10-CM | POA: Diagnosis not present

## 2015-01-13 DIAGNOSIS — R55 Syncope and collapse: Principal | ICD-10-CM | POA: Diagnosis present

## 2015-01-13 DIAGNOSIS — R9401 Abnormal electroencephalogram [EEG]: Secondary | ICD-10-CM | POA: Insufficient documentation

## 2015-01-13 DIAGNOSIS — R404 Transient alteration of awareness: Secondary | ICD-10-CM | POA: Diagnosis not present

## 2015-01-13 DIAGNOSIS — I10 Essential (primary) hypertension: Secondary | ICD-10-CM | POA: Diagnosis present

## 2015-01-13 DIAGNOSIS — M6282 Rhabdomyolysis: Secondary | ICD-10-CM | POA: Diagnosis present

## 2015-01-13 DIAGNOSIS — R001 Bradycardia, unspecified: Secondary | ICD-10-CM | POA: Insufficient documentation

## 2015-01-13 LAB — HEPATIC FUNCTION PANEL
ALBUMIN: 3.4 g/dL — AB (ref 3.5–5.0)
ALK PHOS: 54 U/L (ref 38–126)
ALT: 26 U/L (ref 14–54)
AST: 44 U/L — AB (ref 15–41)
BILIRUBIN TOTAL: 1.2 mg/dL (ref 0.3–1.2)
Bilirubin, Direct: 0.5 mg/dL (ref 0.1–0.5)
Indirect Bilirubin: 0.7 mg/dL (ref 0.3–0.9)
Total Protein: 6.3 g/dL — ABNORMAL LOW (ref 6.5–8.1)

## 2015-01-13 LAB — BASIC METABOLIC PANEL
Anion gap: 11 (ref 5–15)
BUN: 17 mg/dL (ref 6–20)
CALCIUM: 9.4 mg/dL (ref 8.9–10.3)
CO2: 24 mmol/L (ref 22–32)
CREATININE: 0.87 mg/dL (ref 0.44–1.00)
Chloride: 105 mmol/L (ref 101–111)
Glucose, Bld: 102 mg/dL — ABNORMAL HIGH (ref 65–99)
Potassium: 4 mmol/L (ref 3.5–5.1)
Sodium: 140 mmol/L (ref 135–145)

## 2015-01-13 LAB — URINALYSIS, ROUTINE W REFLEX MICROSCOPIC
BILIRUBIN URINE: NEGATIVE
Glucose, UA: NEGATIVE mg/dL
KETONES UR: 15 mg/dL — AB
Leukocytes, UA: NEGATIVE
NITRITE: NEGATIVE
Protein, ur: >300 mg/dL — AB
SPECIFIC GRAVITY, URINE: 1.023 (ref 1.005–1.030)
UROBILINOGEN UA: 0.2 mg/dL (ref 0.0–1.0)
pH: 5 (ref 5.0–8.0)

## 2015-01-13 LAB — GLUCOSE, CAPILLARY: Glucose-Capillary: 74 mg/dL (ref 65–99)

## 2015-01-13 LAB — CREATININE, SERUM
Creatinine, Ser: 0.85 mg/dL (ref 0.44–1.00)
GFR calc non Af Amer: >60 mL/min (ref >60)

## 2015-01-13 LAB — URINE MICROSCOPIC-ADD ON

## 2015-01-13 LAB — PHOSPHORUS: Phosphorus: 4 mg/dL (ref 2.5–4.6)

## 2015-01-13 LAB — CBC
HCT: 39.5 % (ref 36.0–46.0)
HCT: 38.4 % (ref 36.0–46.0)
Hemoglobin: 12.9 g/dL (ref 12.0–15.0)
Hemoglobin: 12.9 g/dL (ref 12.0–15.0)
MCH: 28.4 pg (ref 26.0–34.0)
MCH: 29.2 pg (ref 26.0–34.0)
MCHC: 32.7 g/dL (ref 30.0–36.0)
MCHC: 33.6 g/dL (ref 30.0–36.0)
MCV: 86.8 fL (ref 78.0–100.0)
MCV: 86.9 fL (ref 78.0–100.0)
PLATELETS: 194 10*3/uL (ref 150–400)
PLATELETS: 185 10*3/uL (ref 150–400)
RBC: 4.55 MIL/uL (ref 3.87–5.11)
RBC: 4.42 MIL/uL (ref 3.87–5.11)
RDW: 14.5 % (ref 11.5–15.5)
RDW: 14.6 % (ref 11.5–15.5)
WBC: 6.1 10*3/uL (ref 4.0–10.5)
WBC: 5 10*3/uL (ref 4.0–10.5)

## 2015-01-13 LAB — VITAMIN B12: Vitamin B-12: 893 pg/mL (ref 180–914)

## 2015-01-13 LAB — TSH: TSH: 0.384 u[IU]/mL (ref 0.350–4.500)

## 2015-01-13 LAB — MAGNESIUM: Magnesium: 2 mg/dL (ref 1.7–2.4)

## 2015-01-13 LAB — TROPONIN I

## 2015-01-13 LAB — CK: Total CK: 846 U/L — ABNORMAL HIGH (ref 38–234)

## 2015-01-13 MED ORDER — LISINOPRIL 20 MG PO TABS
20.0000 mg | ORAL_TABLET | Freq: Two times a day (BID) | ORAL | Status: DC
  Administered 2015-01-14 – 2015-01-17 (×7): 20 mg via ORAL
  Filled 2015-01-13 (×7): qty 1

## 2015-01-13 MED ORDER — POLYETHYLENE GLYCOL 3350 17 G PO PACK
17.0000 g | PACK | Freq: Every day | ORAL | Status: DC
  Administered 2015-01-14 – 2015-01-17 (×4): 17 g via ORAL
  Filled 2015-01-13 (×4): qty 1

## 2015-01-13 MED ORDER — ONDANSETRON HCL 4 MG PO TABS: 4.0000 mg | ORAL_TABLET | Freq: Four times a day (QID) | ORAL | Status: DC | PRN

## 2015-01-13 MED ORDER — ONDANSETRON HCL 4 MG/2ML IJ SOLN
4.0000 mg | Freq: Four times a day (QID) | INTRAMUSCULAR | Status: DC | PRN
  Administered 2015-01-16: 4 mg via INTRAVENOUS
  Filled 2015-01-13: qty 2

## 2015-01-13 MED ORDER — SODIUM CHLORIDE 0.9 % IJ SOLN
3.0000 mL | Freq: Two times a day (BID) | INTRAMUSCULAR | Status: DC
  Administered 2015-01-14 – 2015-01-17 (×3): 3 mL via INTRAVENOUS

## 2015-01-13 MED ORDER — DEXTROSE-NACL 5-0.45 % IV SOLN
INTRAVENOUS | Status: DC
  Administered 2015-01-13 – 2015-01-14 (×2): via INTRAVENOUS

## 2015-01-13 MED ORDER — ROSUVASTATIN CALCIUM 5 MG PO TABS
5.0000 mg | ORAL_TABLET | Freq: Every day | ORAL | Status: DC
  Administered 2015-01-14 – 2015-01-17 (×4): 5 mg via ORAL
  Filled 2015-01-13 (×4): qty 1

## 2015-01-13 MED ORDER — HYDRALAZINE HCL 20 MG/ML IJ SOLN
10.0000 mg | Freq: Once | INTRAMUSCULAR | Status: AC
  Administered 2015-01-13: 10 mg via INTRAVENOUS
  Filled 2015-01-13: qty 1

## 2015-01-13 MED ORDER — ENOXAPARIN SODIUM 40 MG/0.4ML ~~LOC~~ SOLN
40.0000 mg | SUBCUTANEOUS | Status: DC
  Administered 2015-01-13 – 2015-01-16 (×4): 40 mg via SUBCUTANEOUS
  Filled 2015-01-13 (×4): qty 0.4

## 2015-01-13 MED ORDER — ACETAMINOPHEN 325 MG PO TABS: 650.0000 mg | ORAL_TABLET | Freq: Four times a day (QID) | ORAL | Status: DC | PRN

## 2015-01-13 MED ORDER — INSULIN ASPART 100 UNIT/ML ~~LOC~~ SOLN
0.0000 [IU] | Freq: Three times a day (TID) | SUBCUTANEOUS | Status: DC
  Administered 2015-01-16: 2 [IU] via SUBCUTANEOUS
  Administered 2015-01-17: 1 [IU] via SUBCUTANEOUS

## 2015-01-13 MED ORDER — ASPIRIN-DIPYRIDAMOLE ER 25-200 MG PO CP12
1.0000 | ORAL_CAPSULE | Freq: Two times a day (BID) | ORAL | Status: DC
  Administered 2015-01-14 – 2015-01-17 (×7): 1 via ORAL
  Filled 2015-01-13 (×10): qty 1

## 2015-01-13 MED ORDER — ONDANSETRON HCL 4 MG/2ML IJ SOLN: 4.0000 mg | Freq: Three times a day (TID) | INTRAMUSCULAR | Status: DC | PRN

## 2015-01-13 MED ORDER — HYDRALAZINE HCL 25 MG PO TABS
25.0000 mg | ORAL_TABLET | Freq: Three times a day (TID) | ORAL | Status: DC
  Administered 2015-01-14 – 2015-01-15 (×3): 25 mg via ORAL
  Filled 2015-01-13 (×4): qty 1

## 2015-01-13 MED ORDER — ACETAMINOPHEN 650 MG RE SUPP: 650.0000 mg | Freq: Four times a day (QID) | RECTAL | Status: DC | PRN

## 2015-01-13 MED ORDER — SODIUM CHLORIDE 0.9 % IV SOLN
INTRAVENOUS | Status: DC
  Administered 2015-01-13: 20:00:00 via INTRAVENOUS

## 2015-01-13 NOTE — ED Provider Notes (Signed)
CSN: [id number]     Arrival date & time 01/13/15  1208 History   First MD Initiated Contact with Patient 01/13/15 1210     Chief Complaint  Patient presents with  . Loss of Consciousness     (Consider location/radiation/quality/duration/timing/severity/associated sxs/prior Treatment) HPI  The patient is a 74 year old female with a history of high cholesterol, high blood pressure, stroke. She presents to the hospital with a complaint of altered mental status. She reports falling out of bed over the evening, she states it was a slip, she did not strike her head, she laid on the ground all night until her family found her this morning. They took her to the bathroom, they report that she went unresponsive in her chair, they found her approximately 5 minutes later slumped over in the chair but breathing and having a pulse. When paramedics arrived they found the patient to be breathing spontaneously but slow, had a normal pulse, they gave her a nonrebreather with oxygen and the patient came around. She denies any pain complaints, she is not short of breath, she is now back to normal with her mental status. She has fallen asleep a couple of times in route to the hospital but each time was able to wake up with voice. She denies any other symptoms other than having some difficulty urinating. She reports that sometime she is incontinent. No fevers, chills, nausea, vomiting, chest pain, shortness of breath, back pain, swelling of the legs.  Past Medical History  Diagnosis Date  . Stroke   . Hypertension   . Hypercholesteremia   . Diabetes mellitus    Past Surgical History  Procedure Laterality Date  . Tooth extraction     Family History  Problem Relation Age of Onset  . Diabetes type II    . Hypertension     Social History  Substance Use Topics  . Smoking status: Never Smoker   . Smokeless tobacco: Never Used  . Alcohol Use: No   OB History    No data available     Review of Systems   All other systems reviewed and are negative.     Allergies  Review of patient's allergies indicates no known allergies.  Home Medications   Prior to Admission medications   Medication Sig Start Date End Date Taking? Authorizing Provider  bisacodyl (DULCOLAX) 5 MG EC tablet Take 5 mg by mouth daily as needed. For constipation    Historical Provider, MD  dipyridamole-aspirin (AGGRENOX) 25-200 MG per 12 hr capsule Take 1 capsule by mouth 2 (two) times daily.    Historical Provider, MD  hydrALAZINE (APRESOLINE) 25 MG tablet Take 1 tablet (25 mg total) by mouth every 8 (eight) hours. 11/17/14   Joseph Art, DO  lisinopril (PRINIVIL,ZESTRIL) 20 MG tablet Take 20 mg by mouth 2 (two) times daily.    Historical Provider, MD  rosuvastatin (CRESTOR) 5 MG tablet Take 5 mg by mouth daily.    Historical Provider, MD   BP 146/58 mmHg  Pulse 56  Temp(Src) 98.1 F (36.7 C) (Oral)  Resp 19  Wt 149 lb (67.586 kg)  SpO2 99% Physical Exam  Constitutional: She appears well-developed and well-nourished. No distress.  HENT:  Head: Normocephalic and atraumatic.  Mouth/Throat: Oropharynx is clear and moist. No oropharyngeal exudate.  Eyes: Conjunctivae and EOM are normal. Pupils are equal, round, and reactive to light. Right eye exhibits no discharge. Left eye exhibits no discharge. No scleral icterus.  Neck: Normal range of motion. Neck  supple. No JVD present. No thyromegaly present.  Cardiovascular: Normal rate, regular rhythm, normal heart sounds and intact distal pulses.  Exam reveals no gallop and no friction rub.   No murmur heard. Pulmonary/Chest: Effort normal and breath sounds normal. No respiratory distress. She has no wheezes. She has no rales.  Abdominal: Soft. Bowel sounds are normal. She exhibits no distension and no mass. There is no tenderness.  Musculoskeletal: Normal range of motion. She exhibits no edema or tenderness.  Lymphadenopathy:    She has no cervical adenopathy.   Neurological: She is alert. Coordination normal.  Slight facial droop, slight weakness of right arm and right leg, speech is normal  Skin: Skin is warm and dry. No rash noted. No erythema.  Psychiatric: She has a normal mood and affect. Her behavior is normal.  Nursing note and vitals reviewed.   ED Course  Procedures (including critical care time) Labs Review Labs Reviewed  CK - Abnormal; Notable for the following:    Total CK 846 (*)    All other components within normal limits  BASIC METABOLIC PANEL - Abnormal; Notable for the following:    Glucose, Bld 102 (*)    All other components within normal limits  TROPONIN I  CBC  URINALYSIS, ROUTINE W REFLEX MICROSCOPIC (NOT AT [facility])    Imaging Review Ct Head Wo Contrast  01/13/2015   CLINICAL DATA:  Unresponsive  EXAM: CT HEAD WITHOUT CONTRAST  TECHNIQUE: Contiguous axial images were obtained from the base of the skull through the vertex without intravenous contrast.  COMPARISON:  11/16/2014  FINDINGS: There is no evidence of mass effect, midline shift, or extra-axial fluid collections. There is no evidence of a space-occupying lesion or intracranial hemorrhage. There is no evidence of a cortical-based area of acute infarction. There is generalized cerebral atrophy. There is periventricular white matter low attenuation likely secondary to microangiopathy.  The ventricles and sulci are appropriate for the patient's age. The basal cisterns are patent.  Visualized portions of the orbits are unremarkable. The visualized portions of the paranasal sinuses and mastoid air cells are unremarkable. Cerebrovascular atherosclerotic calcifications are noted.  The osseous structures are unremarkable.  IMPRESSION: 1. No acute intracranial pathology. 2. Chronic microvascular disease and cerebral atrophy.   Electronically Signed   By: Elige Ko   On: 01/13/2015 13:08   I, Eber Hong D, personally reviewed and evaluated these images and lab results as  part of my medical decision-making.   EKG Interpretation   Date/Time:  Monday January 13 2015 12:10:08 EDT Ventricular Rate:  61 PR Interval:  155 QRS Duration: 83 QT Interval:  426 QTC Calculation: 429 R Axis:   -15 Text Interpretation:  Sinus rhythm Left ventricular hypertrophy Anterior Q  waves, possibly due to LVH Since last tracing Left ventricular hypertrophy  more prominent Confirmed by Praneeth Bussey  MD, Burle Kwan ([id number]) on 01/13/2015  12:25:08 PM      MDM   Final diagnoses:  Syncope, unspecified syncope type    The patient is in no distress, she has no findings of trauma, she has a neurologic exam which is at her baseline. I suspect that the reason that the patient was tired this morning was that she was awakened on the ground all night long unable to get up into her bed. We'll obtain some baseline imaging, get a urinalysis and an EKG to rule out any other more significant sources. The patient is in agreement.  The patient has had no further syncopal episodes. She is  sleeping persistently, easily to arouse, labs unremarkable, no signs of rhabdomyolysis, renal function preserved, creatinine kinase less than 1000 and CT scan of the head unremarkable.  Discussed care with hospitalist who will admit, he has seen the patient in the emergency department.  I have personally viewed and interpreted the imaging and agree with radiologist interpretation.   Eber Hong, MD 01/13/15 [phone]

## 2015-01-13 NOTE — ED Notes (Signed)
Bib ems pt found in walker seat unresponsive. Per ems pt had prolonged unresponsive woke with rigorous stimulation. Pt alert at baseline at this time, ems states pt unable to ambulate on scene as normal. Pt states fell out of bed last pm pronged time in floor. Denies c/o pain related.

## 2015-01-13 NOTE — H&P (Signed)
[facility] History and Physical  [name] YIA:[id number] DOB: 10/19/1940 DOA: 01/13/2015  Referring physician: Dr. Hyacinth Meeker PCP: Georgann Housekeeper, MD   Chief Complaint: syncope  HPI: [name] is a 74 y.o. 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 Patient reports that the night prior to admission she fell out of bed and was too weak to get up; and family, but this morning to get to the bathroom to get ready and seat her on her chair, patient slumped to the left and become unresponsive and EMS was called and on their arrival patient was found with normal vital signs and Just heavy breathing. There were a moment of blank staring and unresponsive. She subsequently regained consciousness and was slightly confused. She denies chest pain, shortness of breath, fever, dysuria, nausea, vomiting, palpitation, abdominal pain, melena, or any other acute complaints that could precipitate her symptoms.   In the ED CT scan of the head was negative for acute intracranial abnormalities, blood work essentially normal, with mild elevation of specific gravity in her urinalysis and elevated CK total. [facility] has been called to admit the patient for further evaluation and treatment of syncope.   Review of Systems:  Negative except as otherwise mentioned on history of present illness.  Past Medical History  Diagnosis Date  . Stroke   . Hypertension   . Hypercholesteremia   . Diabetes mellitus    Past Surgical History  Procedure Laterality Date  . Tooth extraction     Social History:  reports that she has never smoked. She has never used smokeless tobacco. She reports that she does not drink alcohol or use illicit drugs.  No Known  Allergies  Family History  Problem Relation Age of Onset  . Diabetes type II    . Hypertension      Prior to Admission medications   Medication Sig Start Date End Date Taking? Authorizing Provider  bisacodyl (DULCOLAX) 5 MG EC tablet Take 5 mg by mouth daily as needed. For constipation    Historical Provider, MD  dipyridamole-aspirin (AGGRENOX) 25-200 MG per 12 hr capsule Take 1 capsule by mouth 2 (two) times daily.    Historical Provider, MD  hydrALAZINE (APRESOLINE) 25 MG tablet Take 1 tablet (25 mg total) by mouth every 8 (eight) hours. 11/17/14   Joseph Art, DO  lisinopril (PRINIVIL,ZESTRIL) 20 MG tablet Take 20 mg by mouth 2 (two) times daily.    Historical Provider, MD  rosuvastatin (CRESTOR) 5 MG tablet Take 5 mg by mouth daily.    Historical Provider, MD   Physical Exam: Filed Vitals:   01/13/15 1645 01/13/15 1700 01/13/15 1715 01/13/15 1730  BP: 148/59 148/64 156/58 150/58  Pulse: 53 59 60 53  Temp:      TempSrc:      Resp:      Weight:      SpO2: 97% 98% 96% 96%    Wt Readings from Last 3 Encounters:  01/13/15 67.586 kg (149 lb)  11/17/14 67.7 kg (149 lb 4 oz)  09/12/13 77.111 kg (170 lb)    General:  Appears calm and comfortable; patient with mild left facial droop from previous stroke; denies chest pain and SOB. Able to follow commands. Eyes: PERRL, normal lids, irises & conjunctiva, no icterus, no nystagmus ENT: grossly normal hearing,  some dryness appreciated on her mucous membrane, no thrush, no erythema or exudates Neck: no LAD, masses or thyromegaly Cardiovascular: RRR, no m/r/g. No LE edema. Respiratory: CTA bilaterally, no w/r/r. Normal respiratory effort. Abdomen: soft, nt, nd; positive bowel sounds Skin: no rash or induration seen on exam Musculoskeletal: grossly normal tone BUE/BLE; able to move 4 limbs appropriately Psychiatric: grossly normal mood and affect, speech fluent and appropriate Neurologic: Left facial droop (unchanged), alert, awake  and oriented 3; patient with grossly intact cranial nerve, muscle strength 4 out of 5 right upper and lower extremities, and 5 out of 5 upper and lower extremity on the left. Normal finger to nose           Labs on Admission:  Basic Metabolic Panel:  Recent Labs Lab 01/13/15 1234  NA 140  K 4.0  CL 105  CO2 24  GLUCOSE 102*  BUN 17  CREATININE 0.87  CALCIUM 9.4   CBC:  Recent Labs Lab 01/13/15 1234  WBC 6.1  HGB 12.9  HCT 39.5  MCV 86.8  PLT 194   Cardiac Enzymes:  Recent Labs Lab 01/13/15 1234  CKTOTAL 846*  TROPONINI <0.03   Radiological Exams on Admission: Ct Head Wo Contrast  01/13/2015   CLINICAL DATA:  Unresponsive  EXAM: CT HEAD WITHOUT CONTRAST  TECHNIQUE: Contiguous axial images were obtained from the base of the skull through the vertex without intravenous contrast.  COMPARISON:  11/16/2014  FINDINGS: There is no evidence of mass effect, midline shift, or extra-axial fluid collections. There is no evidence of a space-occupying lesion or intracranial hemorrhage. There is no evidence of a cortical-based area of acute infarction. There is generalized cerebral atrophy. There is periventricular white matter low attenuation likely secondary to microangiopathy.  The ventricles and sulci are appropriate for the patient's age. The basal cisterns are patent.  Visualized portions of the orbits are unremarkable. The visualized portions of the paranasal sinuses and mastoid air cells are unremarkable. Cerebrovascular atherosclerotic calcifications are noted.  The osseous structures are unremarkable.  IMPRESSION: 1. No acute intracranial pathology. 2. Chronic microvascular disease and cerebral atrophy.   Electronically Signed   By: Elige Ko   On: 01/13/2015 13:08    EKG: No acute ischemic changes, sinus rhythm. LVH per trace  Assessment/Plan 1-Syncope: unclear etiology. But per EMS report, family members story and lack of capacity from patient to remember event; there  is concerns for seizure. Other considerations includes TIA and also cardiogenic vs vasovagal  -will admit to telemetry to monitor for arrhythmias. Negative troponin -will check EEG -CT head neg; will hold on MRI for now as there is no deficit appreciated on exam. -will check orthostatics -2-D echo recently done and unremarkable  -will check carotid duplex -will also check TSH and B12 -PT/OT evaluation and rec's -Inpatient workup remained negative she will benefit of event monitoring as an outpatient (as per record review, this is not the first time that she has experienced syncope)  2-H/O: CVA (cerebrovascular accident): no new focal deficit appreciated -will continue Aggrenox   3-Hypertension: Stable and well controlled. -Will continue the use of hydralazine and lisinopril.  4-Rhabdomyolysis (Mild): After the patient pass out, she spent significant time on the floor until she was pick up by family members. -CK 843 -Will provide fluid resuscitation and follow CK levels  5-HLD (hyperlipidemia): Will check fasting lipid profile -Will continue statins  6-Diabetes type 2, controlled: Last A1c on 11/16/2014 was 5.7 -Patient currently on diet control as part  of diabetes management -Will monitor -CBG on admission 102  Code Status: Full DVT Prophylaxis:Lovenox  Family Communication: no family at bedside Disposition Plan: observation, telemetry, LOS < 2 midnights   Time spent: 55 minutes  Vassie Loll [facility] Pager [phone]

## 2015-01-13 NOTE — ED Notes (Signed)
Patient transported to CT 

## 2015-01-13 NOTE — ED Notes (Signed)
Attempted report x1. 

## 2015-01-13 NOTE — Progress Notes (Signed)
Text paged MD regarding BP and CBG. Awaiting response.

## 2015-01-14 ENCOUNTER — Observation Stay (HOSPITAL_COMMUNITY): Payer: Commercial Managed Care - HMO

## 2015-01-14 DIAGNOSIS — I6789 Other cerebrovascular disease: Secondary | ICD-10-CM | POA: Diagnosis not present

## 2015-01-14 DIAGNOSIS — I10 Essential (primary) hypertension: Secondary | ICD-10-CM | POA: Diagnosis not present

## 2015-01-14 DIAGNOSIS — R9401 Abnormal electroencephalogram [EEG]: Secondary | ICD-10-CM | POA: Diagnosis not present

## 2015-01-14 DIAGNOSIS — E780 Pure hypercholesterolemia: Secondary | ICD-10-CM | POA: Diagnosis not present

## 2015-01-14 DIAGNOSIS — E139 Other specified diabetes mellitus without complications: Secondary | ICD-10-CM | POA: Diagnosis not present

## 2015-01-14 DIAGNOSIS — R55 Syncope and collapse: Secondary | ICD-10-CM

## 2015-01-14 DIAGNOSIS — G8191 Hemiplegia, unspecified affecting right dominant side: Secondary | ICD-10-CM | POA: Diagnosis not present

## 2015-01-14 DIAGNOSIS — R569 Unspecified convulsions: Secondary | ICD-10-CM | POA: Diagnosis not present

## 2015-01-14 DIAGNOSIS — Z8673 Personal history of transient ischemic attack (TIA), and cerebral infarction without residual deficits: Secondary | ICD-10-CM | POA: Diagnosis not present

## 2015-01-14 DIAGNOSIS — E785 Hyperlipidemia, unspecified: Secondary | ICD-10-CM | POA: Diagnosis not present

## 2015-01-14 DIAGNOSIS — R29898 Other symptoms and signs involving the musculoskeletal system: Secondary | ICD-10-CM | POA: Diagnosis not present

## 2015-01-14 DIAGNOSIS — I69351 Hemiplegia and hemiparesis following cerebral infarction affecting right dominant side: Secondary | ICD-10-CM | POA: Diagnosis not present

## 2015-01-14 DIAGNOSIS — E119 Type 2 diabetes mellitus without complications: Secondary | ICD-10-CM | POA: Diagnosis not present

## 2015-01-14 DIAGNOSIS — M6282 Rhabdomyolysis: Secondary | ICD-10-CM | POA: Diagnosis not present

## 2015-01-14 LAB — GLUCOSE, CAPILLARY
GLUCOSE-CAPILLARY: 89 mg/dL (ref 65–99)
GLUCOSE-CAPILLARY: 100 mg/dL — AB (ref 65–99)
GLUCOSE-CAPILLARY: 98 mg/dL (ref 65–99)

## 2015-01-14 LAB — LIPID PANEL
CHOLESTEROL: 145 mg/dL (ref 0–200)
HDL: 69 mg/dL (ref >40)
LDL Cholesterol: 63 mg/dL (ref 0–99)
TRIGLYCERIDES: 67 mg/dL (ref <150)
Total CHOL/HDL Ratio: 2.1 RATIO
VLDL: 13 mg/dL (ref 0–40)

## 2015-01-14 LAB — BASIC METABOLIC PANEL
ANION GAP: 10 (ref 5–15)
BUN: 11 mg/dL (ref 6–20)
CHLORIDE: 103 mmol/L (ref 101–111)
CO2: 24 mmol/L (ref 22–32)
Calcium: 8.8 mg/dL — ABNORMAL LOW (ref 8.9–10.3)
Creatinine, Ser: 0.88 mg/dL (ref 0.44–1.00)
GFR calc Af Amer: >60 mL/min (ref >60)
GFR calc non Af Amer: >60 mL/min (ref >60)
GLUCOSE: 88 mg/dL (ref 65–99)
POTASSIUM: 3.6 mmol/L (ref 3.5–5.1)
Sodium: 137 mmol/L (ref 135–145)

## 2015-01-14 LAB — CBC
HEMATOCRIT: 36.6 % (ref 36.0–46.0)
HEMOGLOBIN: 12.1 g/dL (ref 12.0–15.0)
MCH: 28.7 pg (ref 26.0–34.0)
MCHC: 33.1 g/dL (ref 30.0–36.0)
MCV: 86.9 fL (ref 78.0–100.0)
Platelets: 205 10*3/uL (ref 150–400)
RBC: 4.21 MIL/uL (ref 3.87–5.11)
RDW: 14.5 % (ref 11.5–15.5)
WBC: 4.6 10*3/uL (ref 4.0–10.5)

## 2015-01-14 LAB — CK: CK TOTAL: 584 U/L — AB (ref 38–234)

## 2015-01-14 MED ORDER — SODIUM CHLORIDE 0.9 % IV SOLN
500.0000 mg | Freq: Two times a day (BID) | INTRAVENOUS | Status: DC
  Administered 2015-01-14 – 2015-01-16 (×5): 500 mg via INTRAVENOUS
  Filled 2015-01-14 (×6): qty 5

## 2015-01-14 NOTE — Progress Notes (Signed)
PROGRESS NOTE  [name] VZD:[id number] DOB: 20-Oct-1940 DOA: 01/13/2015 PCP: Georgann Housekeeper, MD  Assessment/Plan: ? Seizures- EEG abnormal, neuro consult- add keppra  Syncope: suspect related to seizures. But per EMS report, family members story and lack of capacity from patient to remember event; -will admit to telemetry to monitor for arrhythmias. Negative troponin -CT head neg -orthostatics negative -2-D echo recently done and unremarkable  -carotid duplex - TSH and B12 ok -PT/OT evaluation   H/O: CVA (cerebrovascular accident): no new focal deficit appreciated -will continue Aggrenox   Hypertension: Stable and well controlled. -Will continue the use of hydralazine and lisinopril.  Rhabdomyolysis (Mild): After the patient pass out, she spent significant time on the floor until she was pick up by family members. -CK 843 -Will provide fluid resuscitation and follow CK levels  HLD (hyperlipidemia): lipids controlled -Will continue statins  Diabetes type 2, controlled: Last A1c on 11/16/2014 was 5.7 -Patient currently on diet control as part of diabetes management -Will monitor -CBG on admission 102    Code Status: full Family Communication: patient Disposition Plan:    Consultants:  [place]  Procedures: EEG  HPI/Subjective: No complaints  Objective: Filed Vitals:   01/14/15 0538  BP: 157/49  Pulse: 56  Temp: 97.8 F (36.6 C)  Resp: 20    Intake/Output Summary (Last 24 hours) at 01/14/15 1239 Last data filed at 01/14/15 0500  Gross per 24 hour  Intake 703.75 ml  Output      0 ml  Net 703.75 ml   Filed Weights   01/13/15 1220 01/14/15 0800  Weight: 67.586 kg (149 lb) 68.221 kg (150 lb 6.4 oz)    Exam:   General: awake, NAD  Cardiovascular: rrr  Respiratory: clear  Abdomen: +BS soft  Musculoskeletal: no edema   Data Reviewed: Basic Metabolic Panel:  Recent Labs Lab 01/13/15 1234 01/13/15 2023 01/14/15 0630  NA 140  --  137    K 4.0  --  3.6  CL 105  --  103  CO2 24  --  24  GLUCOSE 102*  --  88  BUN 17  --  11  CREATININE 0.87 0.85 0.88  CALCIUM 9.4  --  8.8*  MG  --  2.0  --   PHOS  --  4.0  --    Liver Function Tests:  Recent Labs Lab 01/13/15 2023  AST 44*  ALT 26  ALKPHOS 54  BILITOT 1.2  PROT 6.3*  ALBUMIN 3.4*   No results for input(s): LIPASE, AMYLASE in the last 168 hours. No results for input(s): AMMONIA in the last 168 hours. CBC:  Recent Labs Lab 01/13/15 1234 01/13/15 2023 01/14/15 0630  WBC 6.1 5.0 4.6  HGB 12.9 12.9 12.1  HCT 39.5 38.4 36.6  MCV 86.8 86.9 86.9  PLT 194 185 205   Cardiac Enzymes:  Recent Labs Lab 01/13/15 1234 01/14/15 0630  CKTOTAL 846* 584*  TROPONINI <0.03  --    BNP (last 3 results) No results for input(s): BNP in the last 8760 hours.  ProBNP (last 3 results) No results for input(s): PROBNP in the last 8760 hours.  CBG:  Recent Labs Lab 01/13/15 2105 01/14/15 0747  GLUCAP 74 89    No results found for this or any previous visit (from the past 240 hour(s)).   Studies: Ct Head Wo Contrast  01/13/2015   CLINICAL DATA:  Unresponsive  EXAM: CT HEAD WITHOUT CONTRAST  TECHNIQUE: Contiguous axial images were obtained from the base of  the skull through the vertex without intravenous contrast.  COMPARISON:  11/16/2014  FINDINGS: There is no evidence of mass effect, midline shift, or extra-axial fluid collections. There is no evidence of a space-occupying lesion or intracranial hemorrhage. There is no evidence of a cortical-based area of acute infarction. There is generalized cerebral atrophy. There is periventricular white matter low attenuation likely secondary to microangiopathy.  The ventricles and sulci are appropriate for the patient's age. The basal cisterns are patent.  Visualized portions of the orbits are unremarkable. The visualized portions of the paranasal sinuses and mastoid air cells are unremarkable. Cerebrovascular atherosclerotic  calcifications are noted.  The osseous structures are unremarkable.  IMPRESSION: 1. No acute intracranial pathology. 2. Chronic microvascular disease and cerebral atrophy.   Electronically Signed   By: Elige Ko   On: 01/13/2015 13:08    Scheduled Meds: . dipyridamole-aspirin  1 capsule Oral BID  . enoxaparin (LOVENOX) injection  40 mg Subcutaneous Q24H  . hydrALAZINE  25 mg Oral 3 times per day  . insulin aspart  0-9 Units Subcutaneous TID WC  . levETIRAcetam  500 mg Intravenous Q12H  . lisinopril  20 mg Oral BID  . polyethylene glycol  17 g Oral Daily  . rosuvastatin  5 mg Oral Daily  . sodium chloride  3 mL Intravenous Q12H   Continuous Infusions: . dextrose 5 % and 0.45% NaCl 75 mL/hr at 01/13/15 2137   Antibiotics Given (last 72 hours)    None      Active Problems:   Syncope   H/O: CVA (cerebrovascular accident)   Hypertension   Rhabdomyolysis   HLD (hyperlipidemia)   Diabetes type 2, controlled    Time spent: 25 min    Arcenio Mullaly  [facility] Pager [phone]. If 7PM-7AM, please contact night-coverage at [email or web address], password [employer] 01/14/2015, 12:39 PM

## 2015-01-14 NOTE — Progress Notes (Signed)
EEG Completed; Results Pending  

## 2015-01-14 NOTE — Progress Notes (Signed)
SLP Cancellation Note  Patient Details Name: CALYPSO CASASANTA MRN: [id number] DOB: [birth year]   Cancelled treatment:       Reason Eval/Treat Not Completed: Patient at procedure or test/unavailable    Maxcine Ham, M.A. CCC-SLP [phone]  Maxcine Ham 01/14/2015, 2:50 PM

## 2015-01-14 NOTE — Progress Notes (Signed)
VASCULAR LAB PRELIMINARY  PRELIMINARY  PRELIMINARY  PRELIMINARY  Carotid duplex  completed.    Preliminary report:  Bilateral:  1-39% ICA stenosis.  Vertebral artery flow is antegrade.      Custer Pimenta, RVT 01/14/2015, 12:02 PM

## 2015-01-14 NOTE — Consult Note (Addendum)
NEURO HOSPITALIST CONSULT NOTE   Referring physician: Benjamine Mola   Reason for Consult: syncope with abnormal EEG  HPI:                                                                                                                                          [name] is an 74 y.o. female who noted last night she wanted to go to the bathroom. She attempted to get out of the bed but felt as if both legs were to weak.  She slid herself to the floor and remained on the floor until the morning. She denies losing awareness and does state she urinated on herself due to inability to make it to the bathroom. The next morning family who she lives with called EMS. At this point patient is not a good historian but per note "patient slumped to the left and become unresponsive and EMS was called and on their arrival patient was found with normal vital signs and Just heavy breathing. There were a moment of blank staring and unresponsive. She subsequently regained consciousness and was slightly confused. "    In the ED her CK was 846, Afebrile and normal WBC.   Currently she feels back to her baseline  CT imaging reviewed and showed no acute infarct but EEG did show presence of intermittent focal slowing and occasional sharp wave activity in the left temporal region indicating a potential seizure foci.    Past Medical History  Diagnosis Date  . Stroke   . Hypertension   . Hypercholesteremia   . Diabetes mellitus     Past Surgical History  Procedure Laterality Date  . Tooth extraction      Family History  Problem Relation Age of Onset  . Diabetes type II    . Hypertension       Social History:  reports that she has never smoked. She has never used smokeless tobacco. She reports that she does not drink alcohol or use illicit drugs.  No Known Allergies  MEDICATIONS:                                                                                                                      Prior to Admission:  Prescriptions prior to admission  Medication  Sig Dispense Refill Last Dose  . bisacodyl (DULCOLAX) 5 MG EC tablet Take 5 mg by mouth daily as needed. For constipation   01/13/2015 at Unknown time  . CARTIA XT 240 MG 24 hr capsule Take 240 mg by mouth daily.   Past Week at Unknown time  . dipyridamole-aspirin (AGGRENOX) 25-200 MG per 12 hr capsule Take 1 capsule by mouth 2 (two) times daily.   Past Week at Unknown time  . hydrALAZINE (APRESOLINE) 25 MG tablet Take 1 tablet (25 mg total) by mouth every 8 (eight) hours. 90 tablet 0 01/13/2015 at Unknown time  . latanoprost (XALATAN) 0.005 % ophthalmic solution Place 1 drop into both eyes at bedtime as needed.   Past Week at Unknown time  . lisinopril (PRINIVIL,ZESTRIL) 20 MG tablet Take 20 mg by mouth 2 (two) times daily.   01/13/2015 at Unknown time  . rosuvastatin (CRESTOR) 5 MG tablet Take 5 mg by mouth daily.   01/13/2015 at Unknown time   Scheduled: . dipyridamole-aspirin  1 capsule Oral BID  . enoxaparin (LOVENOX) injection  40 mg Subcutaneous Q24H  . hydrALAZINE  25 mg Oral 3 times per day  . insulin aspart  0-9 Units Subcutaneous TID WC  . levETIRAcetam  500 mg Intravenous Q12H  . lisinopril  20 mg Oral BID  . polyethylene glycol  17 g Oral Daily  . rosuvastatin  5 mg Oral Daily  . sodium chloride  3 mL Intravenous Q12H     ROS:                                                                                                                                       History obtained from the patient  General ROS: negative for - chills, fatigue, fever, night sweats, weight gain or weight loss Psychological ROS: negative for - behavioral disorder, hallucinations, memory difficulties, mood swings or suicidal ideation Ophthalmic ROS: negative for - blurry vision, double vision, eye pain or loss of vision ENT ROS: negative for - epistaxis, nasal discharge, oral lesions, sore throat, tinnitus or vertigo Allergy and  Immunology ROS: negative for - hives or itchy/watery eyes Hematological and Lymphatic ROS: negative for - bleeding problems, bruising or swollen lymph nodes Endocrine ROS: negative for - galactorrhea, hair pattern changes, polydipsia/polyuria or temperature intolerance Respiratory ROS: negative for - cough, hemoptysis, shortness of breath or wheezing Cardiovascular ROS: negative for - chest pain, dyspnea on exertion, edema or irregular heartbeat Gastrointestinal ROS: negative for - abdominal pain, diarrhea, hematemesis, nausea/vomiting or stool incontinence Genito-Urinary ROS: negative for - dysuria, hematuria, incontinence or urinary frequency/urgency Musculoskeletal ROS: negative for - joint swelling or muscular weakness Neurological ROS: as noted in HPI Dermatological ROS: negative for rash and skin lesion changes   Blood pressure 157/49, pulse 56, temperature 97.8 F (36.6 C), temperature source Oral, resp. rate 20, height 5' (1.524 m), weight 68.221 kg (150 lb 6.4 oz),  SpO2 97 %.   Neurologic Examination:                                                                                                      HEENT-  Normocephalic, no lesions, without obvious abnormality.  Normal external eye and conjunctiva.  Normal TM's bilaterally.  Normal auditory canals and external ears. Normal external nose, mucus membranes and septum.  Normal pharynx. Cardiovascular- S1, S2 normal, pulses palpable throughout   Lungs- chest clear, no wheezing, rales, normal symmetric air entry, Heart exam - S1, S2 normal, no murmur, no gallop, rate regular Abdomen- normal findings: bowel sounds normal Extremities- no edema Lymph-no adenopathy palpable Musculoskeletal-no joint tenderness, deformity or swelling Skin-warm and dry, no hyperpigmentation, vitiligo, or suspicious lesions  Neurological Examination Mental Status: Alert, oriented to hospital and year but not month, thought content appropriate.  Speech  dysarthric (old) without evidence of aphasia.  Able to follow 3 step commands without difficulty. Cranial Nerves: II: Discs flat bilaterally; Visual fields grossly normal, pupils equal, round, reactive to light and accommodation III,IV, VI: ptosis not present, extra-ocular motions intact bilaterally V,VII: smile asymmetric on the right, facial light touch sensation normal bilaterally VIII: hearing normal bilaterally IX,X: uvula rises symmetrically XI: bilateral shoulder shrug XII: midline tongue extension Motor: Right : Upper extremity   4/5(old)   Left:     Upper extremity   5/5  Lower extremity   4/5(old)    Lower extremity   5/5 Tone and bulk:normal tone throughout; no atrophy noted Sensory: Pinprick and light touch intact throughout, bilaterally Deep Tendon Reflexes: 2+ and symmetric throughout Plantars: Right: downgoing   Left: downgoing Cerebellar: normal finger-to-nose,and normal heel-to-shin test Gait: not tested due to safety      Lab Results: Basic Metabolic Panel:  Recent Labs Lab 01/13/15 1234 01/13/15 2023 01/14/15 0630  NA 140  --  137  K 4.0  --  3.6  CL 105  --  103  CO2 24  --  24  GLUCOSE 102*  --  88  BUN 17  --  11  CREATININE 0.87 0.85 0.88  CALCIUM 9.4  --  8.8*  MG  --  2.0  --   PHOS  --  4.0  --     Liver Function Tests:  Recent Labs Lab 01/13/15 2023  AST 44*  ALT 26  ALKPHOS 54  BILITOT 1.2  PROT 6.3*  ALBUMIN 3.4*   No results for input(s): LIPASE, AMYLASE in the last 168 hours. No results for input(s): AMMONIA in the last 168 hours.  CBC:  Recent Labs Lab 01/13/15 1234 01/13/15 2023 01/14/15 0630  WBC 6.1 5.0 4.6  HGB 12.9 12.9 12.1  HCT 39.5 38.4 36.6  MCV 86.8 86.9 86.9  PLT 194 185 205    Cardiac Enzymes:  Recent Labs Lab 01/13/15 1234 01/14/15 0630  CKTOTAL 846* 584*  TROPONINI <0.03  --     Lipid Panel:  Recent Labs Lab 01/14/15 0630  CHOL 145  TRIG 67  HDL 69  CHOLHDL 2.1  VLDL 13  LDLCALC  63    CBG:  Recent Labs Lab 01/13/15 2105 01/14/15 0747  GLUCAP 74 89    Microbiology: Results for orders placed or performed during the hospital encounter of 10/22/11  Urine culture     Status: None   Collection Time: 10/23/11 12:13 PM  Result Value Ref Range Status   Specimen Description URINE, RANDOM  Final   Special Requests NONE  Final   Culture  Setup Time [id number]  Final   Colony Count NO GROWTH  Final   Culture NO GROWTH  Final   Report Status 10/24/2011 FINAL  Final    Coagulation Studies: No results for input(s): LABPROT, INR in the last 72 hours.  Imaging: Ct Head Wo Contrast  01/13/2015   CLINICAL DATA:  Unresponsive  EXAM: CT HEAD WITHOUT CONTRAST  TECHNIQUE: Contiguous axial images were obtained from the base of the skull through the vertex without intravenous contrast.  COMPARISON:  11/16/2014  FINDINGS: There is no evidence of mass effect, midline shift, or extra-axial fluid collections. There is no evidence of a space-occupying lesion or intracranial hemorrhage. There is no evidence of a cortical-based area of acute infarction. There is generalized cerebral atrophy. There is periventricular white matter low attenuation likely secondary to microangiopathy.  The ventricles and sulci are appropriate for the patient's age. The basal cisterns are patent.  Visualized portions of the orbits are unremarkable. The visualized portions of the paranasal sinuses and mastoid air cells are unremarkable. Cerebrovascular atherosclerotic calcifications are noted.  The osseous structures are unremarkable.  IMPRESSION: 1. No acute intracranial pathology. 2. Chronic microvascular disease and cerebral atrophy.   Electronically Signed   By: Elige Ko   On: 01/13/2015 13:08       Assessment and plan per attending neurologist  Felicie Morn PA-C [facility] [phone]  01/14/2015, 12:59 PM   Assessment/Plan: 74 YO female with prior CVA who presented to hospital  due to bilateral LE weakness and observed staring spell while in ED. EEG findings show presence of intermittent focal slowing and occasional sharp wave activity in the left temporal region.  Exam at this time is non-focal other than prior residual effects from her CVA.  MRI brain imaging reviewed and overall unremarkable. 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   Recommend: 1) Keppra 500 mg BID. 2) No further inpatient neurological workup required at this time. Follow up with outpatient neurology. 3) Patient is unable to drive, operate heavy machinery, perform activities at heights or participate in water activities until release by outpatient physician.   Elspeth Cho, DO [facility] [phone]  If 7pm- 7am, please page neurology on call as listed in AMION.

## 2015-01-14 NOTE — Care Management Note (Addendum)
Case Management Note  Patient Details  Name: [name] MRN: [id number] Date of Birth: 1941/03/25  Subjective/Objective:                 Patient from home with family. Experienced syncopal episode. Patient to be evaluated by PT. Has a history of CVA. Patient requested new rolator, stating hers is held together with duct tape. She states that it is well over 73 years old.    Action/Plan:  Will continue to follow and offer resources and [facility] as needed. Order placed for Rolator, referral made to [facility] to deliver to room prior to discharge.  Expected Discharge Date:                  Expected Discharge Plan:  Home w Home Health Services  In-House Referral:     Discharge planning Services  CM Consult  Post Acute Care Choice:    Choice offered to:     DME Arranged:    DME Agency:     HH Arranged:    HH Agency:     Status of Service:  In process, will continue to follow  Medicare Important Message Given:    Date Medicare IM Given:    Medicare IM give by:    Date Additional Medicare IM Given:    Additional Medicare Important Message give by:     If discussed at Long Length of Stay Meetings, dates discussed:    Additional Comments:  Lawerance Sabal, RN 01/14/2015, 12:00 PM

## 2015-01-14 NOTE — Evaluation (Signed)
Clinical/Bedside Swallow Evaluation Patient Details  Name: [name] MRN: [id number] Date of Birth: 09-22-1940  Today's Date: 01/14/2015 Time: SLP Start Time (ACUTE ONLY): 1634 SLP Stop Time (ACUTE ONLY): 1645 SLP Time Calculation (min) (ACUTE ONLY): 11 min  Past Medical History:  Past Medical History  Diagnosis Date  . Stroke   . Hypertension   . Hypercholesteremia   . Diabetes mellitus    Past Surgical History:  Past Surgical History  Procedure Laterality Date  . Tooth extraction     HPI:  [name] is a 74 y.o. female with Past medical history of CVA with residual right-sided weakness as well as facial droop and speech difficulty, hypertension, history of type 2 diabetes mellitus currently not on medication, dyslipidemia. The patient is presenting with complaints of passing out episode.   Assessment / Plan / Recommendation Clinical Impression  Pt's oropharyngeal swallow is within gross functional limits. She does have some impulsivity with intake, although despite this she manages to adequately clear her oral cavity and there is no overt evidence to indicate airway compromise. Recommend to continue with current diet orders. SLP to sign off.    Aspiration Risk  Mild    Diet Recommendation Age appropriate regular solids;Thin   Medication Administration: Whole meds with liquid Compensations: Slow rate;Small sips/bites    Other  Recommendations Oral Care Recommendations: Oral care BID   Follow Up Recommendations   n/a    Frequency and Duration  n/a      Pertinent Vitals/Pain n/a    SLP Swallow Goals     Swallow Study Prior Functional Status  Type of Home: House Available Help at Discharge: Personal care attendant    General Other Pertinent Information: [name] is a 74 y.o. female with Past medical history of CVA with residual right-sided weakness as well as facial droop and speech difficulty, hypertension, history of type 2 diabetes mellitus  currently not on medication, dyslipidemia. The patient is presenting with complaints of passing out episode. Type of Study: Bedside swallow evaluation Previous Swallow Assessment: none in chart Diet Prior to this Study: Regular;Thin liquids Temperature Spikes Noted: No Respiratory Status: Room air History of Recent Intubation: No Behavior/Cognition: Alert;Cooperative;Pleasant mood Oral Cavity - Dentition: Adequate natural dentition/normal for age Self-Feeding Abilities: Able to feed self Patient Positioning: Upright in bed Baseline Vocal Quality: Normal    Oral/Motor/Sensory Function Overall Oral Motor/Sensory Function: Impaired at baseline   Ice Chips Ice chips: Not tested   Thin Liquid Thin Liquid: Within functional limits Presentation: Self Fed;Straw    Nectar Thick Nectar Thick Liquid: Not tested   Honey Thick Honey Thick Liquid: Not tested   Puree Puree: Within functional limits Presentation: Self Fed;Spoon   Solid   GO Functional Assessment Tool Used: skilled clinical judgment Functional Limitations: Swallowing Swallow Current Status (U2025): At least 1 percent but less than 20 percent impaired, limited or restricted Swallow Goal Status [phone]): At least 1 percent but less than 20 percent impaired, limited or restricted Swallow Discharge Status [phone]): At least 1 percent but less than 20 percent impaired, limited or restricted  Solid: Within functional limits Presentation: Self Fed        Maxcine Ham, M.A. CCC-SLP [phone]  Maxcine Ham 01/14/2015,4:53 PM

## 2015-01-14 NOTE — Evaluation (Signed)
Physical Therapy Evaluation Patient Details Name: [name] MRN: [id number] DOB: Dec 22, 1940 Today's Date: 01/14/2015   History of Present Illness  [name] is a 74 y.o. female with Past medical history of CVA with residual right-sided weakness as well as facial droop and speech difficulty, hypertension, history of type 2 diabetes mellitus currently not on medication, dyslipidemia.  The patient is presenting with complaints of passing out episode.  Clinical Impression  Pt is moving with assistance, able to position in chair for lunch with some comfort and safety.  Pt is very appropriate for inpt therapy as she is declined from stroke from [facility], has help at home with roommates and caregiver to follow up. Focus her therapy on balance in standing and correcting R side awareness esp during wbing activities.    Follow Up Recommendations CIR    Equipment Recommendations  None recommended by PT (await rehab disposition)    Recommendations for Other Services Rehab consult     Precautions / Restrictions Precautions Precautions: Fall (no chair pad and notified nursing) Restrictions Weight Bearing Restrictions: No      Mobility  Bed Mobility Overal bed mobility: Needs Assistance Bed Mobility: Supine to Sit     Supine to sit: Mod assist;Min assist     General bed mobility comments: assist for trunk and to slide hips out  Transfers Overall transfer level: Needs assistance Equipment used: Rolling walker (2 wheeled);2 person hand held assist Transfers: Sit to/from [employer] Sit to Stand: Mod assist Stand pivot transfers: Mod assist;+2 physical assistance       General transfer comment: reminders every step of the way for hand placement  Ambulation/Gait Ambulation/Gait assistance: Mod assist;+2 physical assistance;+2 safety/equipment Ambulation Distance (Feet): 3 Feet Assistive device: Rolling walker (2 wheeled);2 person hand held assist Gait  Pattern/deviations: Step-to pattern;Decreased dorsiflexion - right;Wide base of support;Trunk flexed;Shuffle Gait velocity: reduced Gait velocity interpretation: Below normal speed for age/gender General Gait Details: slides RLE on floor but can actively lift RLE and wb on RLE to move LLE  Stairs            Wheelchair Mobility    Modified Rankin (Stroke Patients Only) Modified Rankin (Stroke Patients Only) Pre-Morbid Rankin Score: No significant disability Modified Rankin: Moderately severe disability     Balance Overall balance assessment: Needs assistance Sitting-balance support: Feet supported Sitting balance-Leahy Scale: Fair   Postural control: Posterior lean Standing balance support: Bilateral upper extremity supported Standing balance-Leahy Scale: Poor                               Pertinent Vitals/Pain Pain Assessment: No/denies pain    Home Living Family/patient expects to be discharged to:: Private residence Living Arrangements: Other relatives Available Help at Discharge: Personal care attendant Type of Home: House Home Access: Stairs to enter Entrance Stairs-Rails: None Entrance Stairs-Number of Steps: 1 Home Layout: One level Home Equipment: Emergency planning/management officer - 4 wheels;Grab bars - tub/shower;Hand held shower head;Bedside commode Additional Comments: most information obtained from prior therapy evaluation    Prior Function Level of Independence: Needs assistance   Gait / Transfers Assistance Needed: independent with rollator  ADL's / Homemaking Assistance Needed: assist for meals and showers  Comments: chart reports caregiver assist at home but pt initially denies this.  Has 2 friends that live there that are not physically able to help     Hand Dominance   Dominant Hand: Right  Extremity/Trunk Assessment   Upper Extremity Assessment: RUE deficits/detail RUE Deficits / Details: active use but weak         Lower Extremity  Assessment: RLE deficits/detail RLE Deficits / Details: active use but functionally appears weak    Cervical / Trunk Assessment: Normal  Communication   Communication: Expressive difficulties  Cognition Arousal/Alertness: Awake/alert Behavior During Therapy: WFL for tasks assessed/performed Overall Cognitive Status: No family/caregiver present to determine baseline cognitive functioning       Memory: Decreased recall of precautions;Decreased short-term memory              General Comments General comments (skin integrity, edema, etc.): Pt is neglecting R side to some degree as evidenced by her sliding of foot to step and lack of follow through on verbal cues.  Would benefit from CIR evaluation as she was independent on walker at home, able to care for herself     Exercises        Assessment/Plan    PT Assessment Patient needs continued PT services  PT Diagnosis Abnormality of gait;Hemiplegia dominant side   PT Problem List Decreased range of motion;Decreased strength;Decreased activity tolerance;Decreased balance;Decreased mobility;Decreased coordination;Decreased knowledge of use of DME;Cardiopulmonary status limiting activity;Decreased safety awareness  PT Treatment Interventions DME instruction;Gait training;Stair training;Functional mobility training;Therapeutic activities;Neuromuscular re-education;Balance training;Therapeutic exercise;Patient/family education   PT Goals (Current goals can be found in the Care Plan section) Acute Rehab PT Goals Patient Stated Goal: to get home PT Goal Formulation: With patient Time For Goal Achievement: 01/28/15 Potential to Achieve Goals: Good    Frequency Min 4X/week   Barriers to discharge Decreased caregiver support;Inaccessible home environment      Co-evaluation               End of Session   Activity Tolerance: Patient tolerated treatment well;Patient limited by fatigue (Weakness R side as well as neglect) Patient  left: in chair;with call bell/phone within reach;Other (comment) (legs elevated and with lunch) Nurse Communication: Mobility status    Functional Assessment Tool Used: clinical judgment Functional Limitation: Mobility: Walking and moving around Mobility: Walking and Moving Around Current Status (W0981): At least 60 percent but less than 80 percent impaired, limited or restricted Mobility: Walking and Moving Around Goal Status [phone]): At least 60 percent but less than 80 percent impaired, limited or restricted    Time: 1315-1338 PT Time Calculation (min) (ACUTE ONLY): 23 min   Charges:   PT Evaluation $Initial PT Evaluation Tier I: 1 Procedure PT Treatments $Therapeutic Activity: 8-22 mins   PT G Codes:   PT G-Codes **NOT FOR INPATIENT CLASS** Functional Assessment Tool Used: clinical judgment Functional Limitation: Mobility: Walking and moving around Mobility: Walking and Moving Around Current Status (W2956): At least 60 percent but less than 80 percent impaired, limited or restricted Mobility: Walking and Moving Around Goal Status [phone]): At least 60 percent but less than 80 percent impaired, limited or restricted    Ivar Drape 01/14/2015, 2:31 PM   Samul Dada, PT MS Acute Rehab Dept. Number: [facility] [id number] and [phone]

## 2015-01-14 NOTE — Progress Notes (Signed)
OT Cancellation Note  Patient Details Name: [name] MRN: [id number] DOB: [birth year]   Cancelled Treatment:    Reason Eval/Treat Not Completed: Patient at procedure or test/ unavailable. Off unit. Will follow up for OT evaluation as able.  Jenay Morici A 01/14/2015, 10:14 AM

## 2015-01-14 NOTE — Procedures (Signed)
ELECTROENCEPHALOGRAM REPORT   Patient: [name]      Room #: 9R-41 Age: 74 y.o.        Sex: female Referring Physician: Dr Benjamine Mola Report Date:  01/14/2015        Interpreting Physician: Omelia Blackwater  History: DRISHYA WETMORE is an 74 y.o. female hx of prior CVA admitted with transient episode of loss of consciousness and confusion  Medications:  Scheduled: . dipyridamole-aspirin  1 capsule Oral BID  . enoxaparin (LOVENOX) injection  40 mg Subcutaneous Q24H  . hydrALAZINE  25 mg Oral 3 times per day  . insulin aspart  0-9 Units Subcutaneous TID WC  . lisinopril  20 mg Oral BID  . polyethylene glycol  17 g Oral Daily  . rosuvastatin  5 mg Oral Daily  . sodium chloride  3 mL Intravenous Q12H    Conditions of Recording:  This is a 16 channel EEG carried out with the patient in the drowsy state.  Description:  The waking background activity consists of a low voltage, symmetrical, fairly well organized, mix of theta and alpha activity seen from the parieto-occipital and posterior temporal regions.  There is intermittent focal slowing and occasional sharp wave activity noted in the left temporal region. No clear evolution or spread is noted.  Normal sleep architecture is not observed. Hyperventilation was not performed. Intermittent photic stimulation was not performed.     IMPRESSION: Abnormal EEG due to the presence of intermittent focal slowing and occasional sharp wave activity in the left temporal region indicating a potential seizure foci. Clinical correlation recommended.    Elspeth Cho, DO [facility] [phone]  If 7pm- 7am, please page neurology on call as listed in [facility]ION. 01/14/2015, 12:26 PM

## 2015-01-14 NOTE — Progress Notes (Signed)
Rehab Admissions Coordinator Note:  Patient was screened by [name] for appropriateness for an Inpatient Acute Rehab Consult per PT and OT recommendations.  At this time, we are recommending Inpatient Rehab consult. Please place order.  [name] 01/14/2015, 7:12 PM  I can be reached at [phone].

## 2015-01-14 NOTE — Progress Notes (Signed)
Occupational Therapy Evaluation Patient Details Name: OVIYA CREE MRN: [id number] DOB: 1940/06/12 Today's Date: 01/14/2015    History of Present Illness MIMMIE ELVIN is a 74 y.o. female with Past medical history of CVA with residual right-sided weakness as well as facial droop and speech difficulty, hypertension, history of type 2 diabetes mellitus currently not on medication, dyslipidemia.  The patient is presenting with complaints of passing out episode.   Clinical Impression   Patient presents to OT with decreased ADL independence and safety due to the functional deficits listed below. She will benefit from OT to maximize function and to facilitate a safe discharge. OT will follow.    Follow Up Recommendations  Supervision/Assistance - 24 hour ; CIR if family available for training vs SNF if no family   Equipment Recommendations  3 in 1 bedside comode;Tub/shower seat    Recommendations for Other Services       Precautions / Restrictions Precautions Precautions: Fall (no chair pad and notified nursing) Restrictions Weight Bearing Restrictions: No      Mobility Bed Mobility Overal bed mobility: Needs Assistance Bed Mobility: Supine to Sit     Supine to sit: Mod assist;Min assist     General bed mobility comments: assist for trunk and to slide hips out  Transfers Overall transfer level: Needs assistance Equipment used: Rolling walker (2 wheeled);2 person hand held assist Transfers: Sit to/from [employer] Sit to Stand: Mod assist Stand pivot transfers: Mod assist;+2 physical assistance       General transfer comment: reminders every step of the way for hand placement         ADL Overall ADL's : Needs assistance/impaired Eating/Feeding: Set up;Minimal assistance;Sitting;Bed level   Grooming: Wash/dry face;Set up;Sitting               Lower Body Dressing: Minimal assistance;Sit to/from stand   Toilet Transfer: Moderate  assistance;+2 for safety/equipment;Stand-pivot           Functional mobility during ADLs: Moderate assistance;+2 for physical assistance;+2 for safety/equipment;Rolling walker;Cueing for safety;Cueing for sequencing General ADL Comments: Patient agreeable to transfer to recliner to finish her lunch. Left in recliner with call bell in reach. Patient had great difficulty giving PLOF -- most information comes from prior therapy evaluation.     Vision  No change from baseline per patient report   Perception     Praxis      Pertinent Vitals/Pain Pain Assessment: No/denies pain     Hand Dominance Right   Extremity/Trunk Assessment Upper Extremity Assessment Upper Extremity Assessment: RUE deficits/detail RUE Deficits / Details: active use but weak   Lower Extremity Assessment Lower Extremity Assessment: RLE deficits/detail RLE Deficits / Details: active use but functionally appears weak   Cervical / Trunk Assessment Cervical / Trunk Assessment: Normal   Communication Communication Communication: Expressive difficulties   Cognition Arousal/Alertness: Awake/alert Behavior During Therapy: WFL for tasks assessed/performed Overall Cognitive Status: No family/caregiver present to determine baseline cognitive functioning       Memory: Decreased recall of precautions;Decreased short-term memory             General Comments       Exercises       Shoulder Instructions      Home Living Family/patient expects to be discharged to:: Private residence Living Arrangements: Other relatives Available Help at Discharge: Personal care attendant Type of Home: House Home Access: Stairs to enter [employer] of Steps: 1 Entrance Stairs-Rails: None Home Layout: One level  Bathroom Shower/Tub: Chief Strategy Officer: Standard     Home Equipment: Emergency planning/management officer - 4 wheels;Grab bars - tub/shower;Hand held shower head;Bedside commode   Additional  Comments: most information obtained from prior therapy evaluation      Prior Functioning/Environment Level of Independence: Needs assistance  Gait / Transfers Assistance Needed: independent with rollator ADL's / Homemaking Assistance Needed: assist for meals and showers   Comments: chart reports caregiver assist at home but pt initially denies this.  Has 2 friends that live there that are not physically able to help    OT Diagnosis: Generalized weakness;Cognitive deficits   OT Problem List: Decreased strength;Decreased range of motion;Decreased activity tolerance;Impaired balance (sitting and/or standing);Decreased safety awareness;Decreased cognition;Impaired UE functional use   OT Treatment/Interventions: Self-care/ADL training;Therapeutic exercise;DME and/or AE instruction;Therapeutic activities;Patient/family education    OT Goals(Current goals can be found in the care plan section) Acute Rehab OT Goals Patient Stated Goal: to get home OT Goal Formulation: With patient Time For Goal Achievement: 01/28/15 Potential to Achieve Goals: Fair  OT Frequency: Min 2X/week   Barriers to D/C:            Co-evaluation              End of Session Equipment Utilized During Treatment: Engineer, water Communication: Mobility status  Activity Tolerance: Patient tolerated treatment well Patient left: in chair;with call bell/phone within reach   Time: 1315-1338 OT Time Calculation (min): 23 min Charges:  OT General Charges $OT Visit: 1 Procedure OT Evaluation $Initial OT Evaluation Tier I: 1 Procedure G-Codes: OT G-codes **NOT FOR INPATIENT CLASS** Functional Limitation: Self care Self Care Current Status (N8295): At least 40 percent but less than 60 percent impaired, limited or restricted Self Care Goal Status (A2130): At least 1 percent but less than 20 percent impaired, limited or restricted  Oneisha Ammons A 01/14/2015, 2:32 PM

## 2015-01-15 DIAGNOSIS — I10 Essential (primary) hypertension: Secondary | ICD-10-CM | POA: Diagnosis not present

## 2015-01-15 DIAGNOSIS — E785 Hyperlipidemia, unspecified: Secondary | ICD-10-CM

## 2015-01-15 DIAGNOSIS — M6282 Rhabdomyolysis: Secondary | ICD-10-CM

## 2015-01-15 DIAGNOSIS — Z8673 Personal history of transient ischemic attack (TIA), and cerebral infarction without residual deficits: Secondary | ICD-10-CM

## 2015-01-15 DIAGNOSIS — R9401 Abnormal electroencephalogram [EEG]: Secondary | ICD-10-CM | POA: Diagnosis not present

## 2015-01-15 DIAGNOSIS — I69351 Hemiplegia and hemiparesis following cerebral infarction affecting right dominant side: Secondary | ICD-10-CM | POA: Diagnosis not present

## 2015-01-15 DIAGNOSIS — E119 Type 2 diabetes mellitus without complications: Secondary | ICD-10-CM | POA: Diagnosis not present

## 2015-01-15 DIAGNOSIS — R569 Unspecified convulsions: Secondary | ICD-10-CM | POA: Diagnosis not present

## 2015-01-15 DIAGNOSIS — E780 Pure hypercholesterolemia: Secondary | ICD-10-CM | POA: Diagnosis not present

## 2015-01-15 DIAGNOSIS — R55 Syncope and collapse: Secondary | ICD-10-CM | POA: Diagnosis not present

## 2015-01-15 LAB — GLUCOSE, CAPILLARY
GLUCOSE-CAPILLARY: 121 mg/dL — AB (ref 65–99)
GLUCOSE-CAPILLARY: 93 mg/dL (ref 65–99)
GLUCOSE-CAPILLARY: 110 mg/dL — AB (ref 65–99)
Glucose-Capillary: 92 mg/dL (ref 65–99)
Glucose-Capillary: 108 mg/dL — ABNORMAL HIGH (ref 65–99)

## 2015-01-15 MED ORDER — HYDRALAZINE HCL 50 MG PO TABS
50.0000 mg | ORAL_TABLET | Freq: Three times a day (TID) | ORAL | Status: DC
  Administered 2015-01-15 – 2015-01-17 (×6): 50 mg via ORAL
  Filled 2015-01-15 (×6): qty 1

## 2015-01-15 NOTE — Progress Notes (Signed)
Rehab admissions - Evaluated for possible admission.  I met with patient and a friend at the bedside.  Please see rehab consult done by Dr. Letta Pate recommending [facility] therapies.  If patient does not progress to where she feels comfortable going home, patient may need SNF placement.  Case manager and Education officer, museum are aware.  Call me for questions.  #[phone]

## 2015-01-15 NOTE — Clinical Social Work Placement (Signed)
   CLINICAL SOCIAL WORK PLACEMENT  NOTE  Date:  01/15/2015  Patient Details  Name: LASHAWNE ROWLEE MRN: [id number] Date of Birth: 09-18-1940  Clinical Social Work is seeking post-discharge placement for this patient at the Skilled  Nursing Facility level of care (*CSW will initial, date and re-position this form in  chart as items are completed):  Yes   Patient/family provided with [place] Clinical Social Work Department's list of facilities offering this level of care within the geographic area requested by the patient (or if unable, by the patient's family).  Yes   Patient/family informed of their freedom to choose among providers that offer the needed level of care, that participate in Medicare, Medicaid or managed care program needed by the patient, have an available bed and are willing to accept the patient.  Yes   Patient/family informed of [facility]'s ownership interest in [facility] and [facility], as well as of the fact that they are under no obligation to receive care at these facilities.  PASRR submitted to EDS on       PASRR number received on       Existing PASRR number confirmed on 01/15/15     FL2 transmitted to all facilities in geographic area requested by pt/family on 01/15/15     FL2 transmitted to all facilities within larger geographic area on       Patient informed that his/her managed care company has contracts with or will negotiate with certain facilities, including the following:            Patient/family informed of bed offers received.  Patient chooses bed at       Physician recommends and patient chooses bed at      Patient to be transferred to   on  .  Patient to be transferred to facility by       Patient family notified on   of transfer.  Name of family member notified:        PHYSICIAN       Additional Comment:    _______________________________________________ Roddie Mc MSW, [place], [place], [phone]

## 2015-01-15 NOTE — Clinical Social Work Note (Signed)
Clinical Social Work Assessment  Patient Details  Name: [name] MRN: [id number] Date of Birth: 1941/01/17  Date of referral:  01/15/15               Reason for consult:  Discharge Planning, Facility Placement                Permission sought to share information with:  Family Supports, Chartered certified accountant granted to share information::  Yes, Verbal Permission Granted  Name::     [name] and Passenger transport manager::  SNFs  Relationship::     Contact Information:     Housing/Transportation Living arrangements for the past 2 months:  [place] of Information:  Patient Patient Interpreter Needed:  None Criminal Activity/Legal Involvement Pertinent to Current Situation/Hospitalization:  No - Comment as needed Significant Relationships:  Other Family Members, Friend Lives with:  Friends Do you feel safe going back to the place where you live?  Yes Need for family participation in patient care:  Yes (Comment)  Care giving concerns:  Patient agrees that she needs continued rehab at discharge. She was interested in CIR but CIR has denied the patient.   Social Worker assessment / plan:  CSW met with the patient at bedside to complete assessment. Patient is not completely oriented but able to answer most of CSW's questions appropriately. CSW has attempted to reach patient's contacts but messages had to be left. The patient states she prefers we contact [name] listed on her face sheet. She states this is her HPOA. Patient states she is agreeable with SNF at discharge. CSW explained SNF search/placement process to patient and answered the patient's questions. This information will also need to be provided to her listed contact [name] when he returns our call or is present at the hospital. The patient states she does not live alone but was not able to tell CSW who she actually lives with. CSW will follow up available bed  offers.  Employment status:  Disabled (Comment on whether or not currently receiving Disability) Insurance information:  Programmer, applications PT Recommendations:  Inpatient Rehab Consult Information / Referral to community resources:  [place]  Patient/Family's Response to care:  Patient appears satisfied with the care she has received here in the hospital.  Patient/Family's Understanding of and Emotional Response to Diagnosis, Current Treatment, and Prognosis:  Patient appears to have limited insight into reason for admission but is able to articulate that she does not feel she can return home at this time.   Emotional Assessment Appearance:  Appears stated age Attitude/Demeanor/Rapport:  Other (Appropriate) Affect (typically observed):  Accepting, Appropriate, Calm, Pleasant Orientation:  Oriented to Self, Oriented to Place, Oriented to Situation Alcohol / Substance use:  Never Used Psych involvement (Current and /or in the community):  No (Comment)  Discharge Needs  Concerns to be addressed:  Discharge Planning Concerns Readmission within the last 30 days:  No Current discharge risk:  Chronically ill, Physical Impairment Barriers to Discharge:  Continued Medical Work up   [employer] MSW, [place], [place], [phone]

## 2015-01-15 NOTE — Consult Note (Signed)
Physical Medicine and Rehabilitation Consult  Reason for Consult:  Seizures Referring Physician: Dr. Benjamine Mola   HPI: [name] is a 74 y.o. female with history of HTN, DM type 2, CVA with residual R-HP, facial droop and speech difficulty; who was admitted on 01/13/15 after sliding to the floor and inability to get up. She was found by family in am and EMS called. Patient reported to have blank, unresponsive, staring episode on evaluation at home as well as in ED.  CT head negative for acute changes and she was noted to have mild rhabdomyolysis with CK 846.  She was treated with IVF and started on Keppra by Neurology.  EEG was done showing presence of intermittent focal slowing and occasional sharp wave activity in left temporal region indicating potential seizure foci. Swallow evaluation shows some impulsivity but no signs of dysphagia and regular diet recommended. PT/OT evaluations done showing problems with mobility as well as processing    Review of Systems  Constitutional: Negative.   HENT: Negative.   Eyes: Negative.   Respiratory: Negative.   Cardiovascular: Negative.   Gastrointestinal: Negative.   Genitourinary: Negative.   Musculoskeletal: Negative.   Skin: Negative.   Neurological: Positive for focal weakness and seizures.  Endo/Heme/Allergies: Negative.   Psychiatric/Behavioral: Positive for memory loss. The patient is not nervous/anxious.       Past Medical History  Diagnosis Date  . Stroke   . Hypertension   . Hypercholesteremia   . Diabetes mellitus     Past Surgical History  Procedure Laterality Date  . Tooth extraction      Family History  Problem Relation Age of Onset  . Diabetes type II    . Hypertension      Social History:  reports that she has never smoked. She has never used smokeless tobacco. She reports that she does not drink alcohol or use illicit drugs.    Allergies: No Known Allergies    Medications Prior to Admission    Medication Sig Dispense Refill  . bisacodyl (DULCOLAX) 5 MG EC tablet Take 5 mg by mouth daily as needed. For constipation    . CARTIA XT 240 MG 24 hr capsule Take 240 mg by mouth daily.    Marland Kitchen dipyridamole-aspirin (AGGRENOX) 25-200 MG per 12 hr capsule Take 1 capsule by mouth 2 (two) times daily.    . hydrALAZINE (APRESOLINE) 25 MG tablet Take 1 tablet (25 mg total) by mouth every 8 (eight) hours. 90 tablet 0  . latanoprost (XALATAN) 0.005 % ophthalmic solution Place 1 drop into both eyes at bedtime as needed.    Marland Kitchen lisinopril (PRINIVIL,ZESTRIL) 20 MG tablet Take 20 mg by mouth 2 (two) times daily.    . rosuvastatin (CRESTOR) 5 MG tablet Take 5 mg by mouth daily.      Home: Home Living Family/patient expects to be discharged to:: Private residence Living Arrangements: Other relatives Available Help at Discharge: Personal care attendant Type of Home: House Home Access: Stairs to enter Secretary/administrator of Steps: 1 Entrance Stairs-Rails: None Home Layout: One level Bathroom Shower/Tub: Engineer, manufacturing systems: Standard Home Equipment: Information systems manager, Environmental consultant - 4 wheels, Grab bars - tub/shower, Hand held shower head, Bedside commode Additional Comments: most information obtained from prior therapy evaluation  Functional History: Prior Function Level of Independence: Needs assistance Gait / Transfers Assistance Needed: independent with rollator ADL's / Homemaking Assistance Needed: assist for meals and showers Comments: chart reports caregiver assist at home but pt  initially denies this.  Has 2 friends that live there that are not physically able to help Functional Status:  Mobility: Bed Mobility Overal bed mobility: Needs Assistance Bed Mobility: Supine to Sit Supine to sit: Mod assist, Min assist General bed mobility comments: assist for trunk and to slide hips out Transfers Overall transfer level: Needs assistance Equipment used: Rolling walker (2 wheeled), 2 person  hand held assist Transfers: Sit to/from Stand, Stand Pivot Transfers Sit to Stand: Mod assist Stand pivot transfers: Mod assist, +2 physical assistance General transfer comment: reminders every step of the way for hand placement Ambulation/Gait Ambulation/Gait assistance: Mod assist, +2 physical assistance, +2 safety/equipment Ambulation Distance (Feet): 3 Feet Assistive device: Rolling walker (2 wheeled), 2 person hand held assist Gait Pattern/deviations: Step-to pattern, Decreased dorsiflexion - right, Wide base of support, Trunk flexed, Shuffle General Gait Details: slides RLE on floor but can actively lift RLE and wb on RLE to move LLE Gait velocity: reduced Gait velocity interpretation: Below normal speed for age/gender    ADL: ADL Overall ADL's : Needs assistance/impaired Eating/Feeding: Set up, Minimal assistance, Sitting, Bed level Grooming: Wash/dry face, Set up, Sitting Lower Body Dressing: Minimal assistance, Sit to/from stand Toilet Transfer: Moderate assistance, +2 for safety/equipment, Stand-pivot Functional mobility during ADLs: Moderate assistance, +2 for physical assistance, +2 for safety/equipment, Rolling walker, Cueing for safety, Cueing for sequencing General ADL Comments: Patient agreeable to transfer to recliner to finish her lunch. Left in recliner with call bell in reach. Patient had great difficulty giving PLOF -- most information comes from prior therapy evaluation.  Cognition: Cognition Overall Cognitive Status: No family/caregiver present to determine baseline cognitive functioning Orientation Level: Oriented to person, Oriented to place, Oriented to situation, Disoriented to time Cognition Arousal/Alertness: Awake/alert Behavior During Therapy: WFL for tasks assessed/performed Overall Cognitive Status: No family/caregiver present to determine baseline cognitive functioning Memory: Decreased recall of precautions, Decreased short-term memory    Blood  pressure 170/60, pulse 50, temperature 97.7 F (36.5 C), temperature source Oral, resp. rate 20, height 5' (1.524 m), weight 68.221 kg (150 lb 6.4 oz), SpO2 99 %. Physical Exam  Results for orders placed or performed during the hospital encounter of 01/13/15 (from the past 24 hour(s))  Glucose, capillary     Status: Abnormal   Collection Time: 01/14/15 11:22 AM  Result Value Ref Range   Glucose-Capillary 100 (H) 65 - 99 mg/dL  Glucose, capillary     Status: None   Collection Time: 01/14/15  4:34 PM  Result Value Ref Range   Glucose-Capillary 98 65 - 99 mg/dL   Ct Head Wo Contrast  01/13/2015   CLINICAL DATA:  Unresponsive  EXAM: CT HEAD WITHOUT CONTRAST  TECHNIQUE: Contiguous axial images were obtained from the base of the skull through the vertex without intravenous contrast.  COMPARISON:  11/16/2014  FINDINGS: There is no evidence of mass effect, midline shift, or extra-axial fluid collections. There is no evidence of a space-occupying lesion or intracranial hemorrhage. There is no evidence of a cortical-based area of acute infarction. There is generalized cerebral atrophy. There is periventricular white matter low attenuation likely secondary to microangiopathy.  The ventricles and sulci are appropriate for the patient's age. The basal cisterns are patent.  Visualized portions of the orbits are unremarkable. The visualized portions of the paranasal sinuses and mastoid air cells are unremarkable. Cerebrovascular atherosclerotic calcifications are noted.  The osseous structures are unremarkable.  IMPRESSION: 1. No acute intracranial pathology. 2. Chronic microvascular disease and cerebral atrophy.  Electronically Signed   By: Elige Ko   On: 01/13/2015 13:08   [name]  01/14/2015   CLINICAL DATA:  74 year old female with acute onset lower extremity weakness, found on floor this morning. Initial encounter.  EXAM: MRI HEAD WITHOUT CONTRAST  TECHNIQUE: Multiplanar, multiecho pulse  sequences of the brain and surrounding structures were obtained without intravenous contrast.  COMPARISON:  Head CT without contrast 01/13/2015. Brain MRI 11/16/2014, and earlier  FINDINGS: No restricted diffusion or evidence of acute infarction. Major intracranial vascular flow voids are stable.  Advanced and widespread chronic small vessel ischemic changes re- demonstrated, including lacunar infarcts in the bilateral deep gray matter nuclei, left paracentral pons, both cerebellar hemispheres. Confluent bilateral cerebral white matter T2 and FLAIR hyperintensity. Wallace Cullens and white matter signal is stable since June. Scattered hemosiderin deposition re- demonstrated.  Trace mastoid fluid is new. Negative nasopharynx. Increased paranasal sinus mucosal thickening. Orbit and scalp soft tissues are stable. Normal bone marrow signal. Stable visualized cervical spine.  IMPRESSION: 1.  No acute intracranial abnormality. 2. 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   Electronically Signed   By: Odessa Fleming M.D.   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 Does the need for close, 24 hr/day medical supervision in concert with the patient's rehab needs make it unreasonable for this patient to be served in a less intensive setting? Potentially 2. Co-Morbidities requiring supervision/potential complications: DM, HTN 3. Due to safety, disease management, medication administration and patient education, does the patient require 24 hr/day rehab nursing? Potentially 4. Does the patient require coordinated care of a physician, rehab nurse, PT (1-2 hrs/day, 5 days/week) and OT (1-2 hrs/day, 5 days/week) to address physical and functional deficits in the context of the above medical diagnosis(es)? No Addressing deficits in the following areas: balance, endurance,  locomotion, strength and transferring 5. Can the patient actively participate in an intensive therapy program of at least 3 hrs of therapy per day at least 5 days per week? N/A 6. The potential for patient to make measurable gains while on inpatient rehab is fair 7. Anticipated functional outcomes upon discharge from inpatient rehab are n/a  with PT, n/a with OT, n/a with SLP. 8. Estimated rehab length of stay to reach the above functional goals is: NA 9. Does the patient have adequate social supports and living environment to accommodate these discharge functional goals? Potentially 10. Anticipated D/C setting: Home 11. Anticipated post D/C treatments: HH therapy 12. Overall Rehab/Functional Prognosis: good  RECOMMENDATIONS: This patient's condition is appropriate for continued rehabilitative care in the following setting: [facility] Therapy Patient has agreed to participate in recommended program. Potentially Note that insurance prior authorization may be required for reimbursement for recommended care.  Comment: Patient had a seizure and no evidence of new CVA. Anticipate quick return to prior functional level. Should be able go home with home health.    01/15/2015

## 2015-01-15 NOTE — Progress Notes (Signed)
PROGRESS NOTE  [name]:[id number] DOB: 17-Feb-1941 DOA: 01/13/2015 PCP: Georgann Housekeeper, MD  Assessment/Plan: ? Seizures- EEG abnormal, neuro consult- add keppra  Syncope: suspect related to seizures.  Negative troponin Tele ok- some bradycardia -CT head neg -orthostatics negative -2-D echo recently done and unremarkable  -carotid duplex ok - TSH and B12 ok -PT/OT evaluation - CIR  H/O: CVA (cerebrovascular accident): no new focal deficit appreciated -will continue Aggrenox   Hypertension: Stable and well controlled. -Will continue the use of hydralazine and lisinopril.  Rhabdomyolysis (Mild): After the patient pass out, she spent significant time on the floor until she was pick up by family members. -CK 843 decreasing  HLD (hyperlipidemia): lipids controlled -Will continue statins  Diabetes type 2, controlled: Last A1c on 11/16/2014 was 5.7 -Patient currently on diet control as part of diabetes management -Will monitor -CBG on admission 102    Code Status: full Family Communication: patient- spoke with MPOA [name] yesterday Disposition Plan: CIR?   Consultants:  [place]  Procedures: EEG  HPI/Subjective: Eating breakfast with no complaints Says she is ready to exercise  Objective: Filed Vitals:   01/15/15 0622  BP: 170/60  Pulse: 50  Temp: 97.7 F (36.5 C)  Resp: 20    Intake/Output Summary (Last 24 hours) at 01/15/15 0906 Last data filed at 01/15/15 0700  Gross per 24 hour  Intake 1212.5 ml  Output      0 ml  Net 1212.5 ml   Filed Weights   01/13/15 1220 01/14/15 0800  Weight: 67.586 kg (149 lb) 68.221 kg (150 lb 6.4 oz)    Exam:   General: awake, NAD  Cardiovascular: rrr  Respiratory: clear  Abdomen: +BS soft  Musculoskeletal: no edema   Data Reviewed: Basic Metabolic Panel:  Recent Labs Lab 01/13/15 1234 01/13/15 2023 01/14/15 0630  NA 140  --  137  K 4.0  --  3.6  CL 105  --  103  CO2 24  --  24    GLUCOSE 102*  --  88  BUN 17  --  11  CREATININE 0.87 0.85 0.88  CALCIUM 9.4  --  8.8*  MG  --  2.0  --   PHOS  --  4.0  --    Liver Function Tests:  Recent Labs Lab 01/13/15 2023  AST 44*  ALT 26  ALKPHOS 54  BILITOT 1.2  PROT 6.3*  ALBUMIN 3.4*   No results for input(s): LIPASE, AMYLASE in the last 168 hours. No results for input(s): AMMONIA in the last 168 hours. CBC:  Recent Labs Lab 01/13/15 1234 01/13/15 2023 01/14/15 0630  WBC 6.1 5.0 4.6  HGB 12.9 12.9 12.1  HCT 39.5 38.4 36.6  MCV 86.8 86.9 86.9  PLT 194 185 205   Cardiac Enzymes:  Recent Labs Lab 01/13/15 1234 01/14/15 0630  CKTOTAL 846* 584*  TROPONINI <0.03  --    BNP (last 3 results) No results for input(s): BNP in the last 8760 hours.  ProBNP (last 3 results) No results for input(s): PROBNP in the last 8760 hours.  CBG:  Recent Labs Lab 01/13/15 2105 01/14/15 0747 01/14/15 1122 01/14/15 1634  GLUCAP 74 89 100* 98    No results found for this or any previous visit (from the past 240 hour(s)).   Studies: Ct Head Wo Contrast  01/13/2015   CLINICAL DATA:  Unresponsive  EXAM: CT HEAD WITHOUT CONTRAST  TECHNIQUE: Contiguous axial images were obtained from the base of the skull through  the vertex without intravenous contrast.  COMPARISON:  11/16/2014  FINDINGS: There is no evidence of mass effect, midline shift, or extra-axial fluid collections. There is no evidence of a space-occupying lesion or intracranial hemorrhage. There is no evidence of a cortical-based area of acute infarction. There is generalized cerebral atrophy. There is periventricular white matter low attenuation likely secondary to microangiopathy.  The ventricles and sulci are appropriate for the patient's age. The basal cisterns are patent.  Visualized portions of the orbits are unremarkable. The visualized portions of the paranasal sinuses and mastoid air cells are unremarkable. Cerebrovascular atherosclerotic calcifications  are noted.  The osseous structures are unremarkable.  IMPRESSION: 1. No acute intracranial pathology. 2. Chronic microvascular disease and cerebral atrophy.   Electronically Signed   By: Elige Ko   On: 01/13/2015 13:08   [name]  01/14/2015   CLINICAL DATA:  74 year old female with acute onset lower extremity weakness, found on floor this morning. Initial encounter.  EXAM: MRI HEAD WITHOUT CONTRAST  TECHNIQUE: Multiplanar, multiecho pulse sequences of the brain and surrounding structures were obtained without intravenous contrast.  COMPARISON:  Head CT without contrast 01/13/2015. Brain MRI 11/16/2014, and earlier  FINDINGS: No restricted diffusion or evidence of acute infarction. Major intracranial vascular flow voids are stable.  Advanced and widespread chronic small vessel ischemic changes re- demonstrated, including lacunar infarcts in the bilateral deep gray matter nuclei, left paracentral pons, both cerebellar hemispheres. Confluent bilateral cerebral white matter T2 and FLAIR hyperintensity. Wallace Cullens and white matter signal is stable since June. Scattered hemosiderin deposition re- demonstrated.  Trace mastoid fluid is new. Negative nasopharynx. Increased paranasal sinus mucosal thickening. Orbit and scalp soft tissues are stable. Normal bone marrow signal. Stable visualized cervical spine.  IMPRESSION: 1.  No acute intracranial abnormality. 2. 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   Electronically Signed   By: Odessa Fleming M.D.   On: 01/14/2015 15:59    Scheduled Meds: . dipyridamole-aspirin  1 capsule Oral BID  . enoxaparin (LOVENOX) injection  40 mg Subcutaneous Q24H  . hydrALAZINE  25 mg Oral 3 times per day  . insulin aspart  0-9 Units Subcutaneous TID WC  . levETIRAcetam  500 mg Intravenous Q12H  . lisinopril  20 mg Oral BID  . polyethylene glycol  17 g Oral Daily  . rosuvastatin  5 mg Oral Daily  . sodium chloride  3 mL Intravenous Q12H   Continuous  Infusions:   Antibiotics Given (last 72 hours)    None      Active Problems:   Syncope   H/O: CVA (cerebrovascular accident)   Hypertension   Rhabdomyolysis   HLD (hyperlipidemia)   Diabetes type 2, controlled    Time spent: 25 min    [name]  [facility] Pager [phone]. If 7PM-7AM, please contact night-coverage at [email or web address], password [facility] 01/15/2015, 9:06 AM

## 2015-01-16 DIAGNOSIS — R569 Unspecified convulsions: Secondary | ICD-10-CM | POA: Diagnosis not present

## 2015-01-16 DIAGNOSIS — I10 Essential (primary) hypertension: Secondary | ICD-10-CM | POA: Diagnosis not present

## 2015-01-16 DIAGNOSIS — R55 Syncope and collapse: Secondary | ICD-10-CM | POA: Diagnosis not present

## 2015-01-16 DIAGNOSIS — E119 Type 2 diabetes mellitus without complications: Secondary | ICD-10-CM | POA: Diagnosis not present

## 2015-01-16 DIAGNOSIS — E785 Hyperlipidemia, unspecified: Secondary | ICD-10-CM | POA: Diagnosis not present

## 2015-01-16 DIAGNOSIS — M6282 Rhabdomyolysis: Secondary | ICD-10-CM | POA: Diagnosis not present

## 2015-01-16 DIAGNOSIS — I69351 Hemiplegia and hemiparesis following cerebral infarction affecting right dominant side: Secondary | ICD-10-CM | POA: Diagnosis not present

## 2015-01-16 DIAGNOSIS — R9401 Abnormal electroencephalogram [EEG]: Secondary | ICD-10-CM | POA: Diagnosis not present

## 2015-01-16 DIAGNOSIS — E780 Pure hypercholesterolemia: Secondary | ICD-10-CM | POA: Diagnosis not present

## 2015-01-16 LAB — GLUCOSE, CAPILLARY
Glucose-Capillary: 75 mg/dL (ref 65–99)
Glucose-Capillary: 110 mg/dL — ABNORMAL HIGH (ref 65–99)
Glucose-Capillary: 175 mg/dL — ABNORMAL HIGH (ref 65–99)

## 2015-01-16 MED ORDER — LEVETIRACETAM 500 MG PO TABS
500.0000 mg | ORAL_TABLET | Freq: Two times a day (BID) | ORAL | Status: DC
  Administered 2015-01-16 – 2015-01-17 (×3): 500 mg via ORAL
  Filled 2015-01-16 (×3): qty 1

## 2015-01-16 MED ORDER — LEVETIRACETAM 500 MG PO TABS: 500.0000 mg | ORAL_TABLET | Freq: Two times a day (BID) | ORAL | Status: AC

## 2015-01-16 MED ORDER — HYDRALAZINE HCL 50 MG PO TABS: 50.0000 mg | ORAL_TABLET | Freq: Three times a day (TID) | ORAL | Status: DC

## 2015-01-16 NOTE — Discharge Summary (Signed)
Physician Discharge Summary  [name] GYB:[id number] DOB: December 14, 1940 DOA: 01/13/2015  PCP: Georgann Housekeeper, MD  Admit date: 01/13/2015 Discharge date: 01/16/2015  Time spent: 35 minutes  Recommendations for Outpatient Follow-up:  1. To SNF  Discharge Diagnoses:  Active Problems:   Syncope   H/O: CVA (cerebrovascular accident)   Hypertension   Rhabdomyolysis   HLD (hyperlipidemia)   Diabetes type 2, controlled   Discharge Condition: improved  Diet recommendation: carb mod/cardiac  Filed Weights   01/13/15 1220 01/14/15 0800  Weight: 67.586 kg (149 lb) 68.221 kg (150 lb 6.4 oz)    History of present illness:  [name] is a 74 y.o. 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 Patient reports that the night prior to admission she fell out of bed and was too weak to get up; and family, but this morning to get to the bathroom to get ready and seat her on her chair, patient slumped to the left and become unresponsive and EMS was called and on their arrival patient was found with normal vital signs and Just heavy breathing. There were a moment of blank staring and unresponsive. She subsequently regained consciousness and was slightly confused. She denies chest pain, shortness of breath, fever, dysuria, nausea, vomiting, palpitation, abdominal pain, melena, or any other acute complaints that could precipitate her symptoms.   In the ED CT scan of the head was negative for acute intracranial abnormalities, blood work essentially normal, with mild elevation of specific gravity in her urinalysis and elevated CK total. [facility] has been called to admit the patient for further evaluation and treatment of syncope.  Hospital Course:   Seizures- EEG abnormal, neuro consult- added keppra  Syncope: suspect related to seizures.  Negative troponin Tele ok- some bradycardia -CT head neg -orthostatics negative -2-D echo recently done and unremarkable  -carotid duplex ok - TSH and B12 ok -PT/OT evaluation - SNF for rehab  H/O: CVA (cerebrovascular accident): no new focal deficit appreciated -will continue Aggrenox   Hypertension: Stable and well controlled. -Will continue the use of hydralazine and lisinopril.  Rhabdomyolysis (Mild): After the patient pass out, she spent significant time on the floor until she was pick up by family members. -CK 843 Decreased with fluid hydration  HLD (hyperlipidemia): lipids controlled -Will continue statins  Diabetes type 2, controlled: Last A1c on 11/16/2014 was 5.7 -Patient currently on diet control as part of diabetes management    Procedures:  EEG  Consultations:  neuro  Discharge Exam: Filed Vitals:   01/16/15 0538  BP: 133/50  Pulse: 56  Temp: 97.7 F (36.5 C)  Resp: 17    General: pleasant/cooperative   Discharge Instructions   Discharge Instructions    Diet - low sodium heart healthy    Complete by:  As directed      Diet Carb Modified    Complete by:  As directed      Increase activity slowly    Complete by:  As directed           Current Discharge Medication List    START taking these medications   Details  levETIRAcetam (KEPPRA) 500 MG tablet Take 1 tablet (500 mg total) by mouth 2 (two) times daily.      CONTINUE these medications which have CHANGED   Details  hydrALAZINE (APRESOLINE) 50 MG tablet Take 1 tablet (50 mg total) by mouth every 8 (eight) hours.      CONTINUE these medications which have NOT CHANGED   Details  bisacodyl (DULCOLAX) 5 MG EC tablet Take 5 mg by mouth daily as needed. For constipation    dipyridamole-aspirin (AGGRENOX) 25-200 MG per 12 hr capsule Take 1 capsule by mouth 2 (two) times daily.     latanoprost (XALATAN) 0.005 % ophthalmic solution Place 1 drop into both eyes at bedtime as needed.    lisinopril (PRINIVIL,ZESTRIL) 20 MG tablet Take 20 mg by mouth 2 (two) times daily.    rosuvastatin (CRESTOR) 5 MG tablet Take 5 mg by mouth daily.      STOP taking these medications     CARTIA XT 240 MG 24 hr capsule        No Known Allergies Follow-up Information    Follow up with HUSAIN,KARRAR, MD In 1 week.   Specialty:  Internal Medicine   Contact information:   [address] [place] [address] [phone]        The results of significant diagnostics from this hospitalization (including imaging, microbiology, ancillary and laboratory) are listed below for reference.    Significant Diagnostic Studies: Ct Head Wo Contrast  01/13/2015   CLINICAL DATA:  Unresponsive  EXAM: CT HEAD WITHOUT CONTRAST  TECHNIQUE: Contiguous axial images were obtained from the base of the skull through the vertex without intravenous contrast.  COMPARISON:  11/16/2014  FINDINGS: There is no evidence of mass effect, midline shift, or extra-axial fluid collections. There is no evidence of a space-occupying lesion or intracranial hemorrhage. There is no evidence of a cortical-based area of acute infarction. There is generalized cerebral atrophy. There is periventricular white matter low attenuation likely secondary to microangiopathy.  The ventricles and sulci are appropriate for the patient's age. The basal cisterns are patent.  Visualized portions of the orbits are unremarkable. The visualized portions of the paranasal sinuses and mastoid air cells are unremarkable. Cerebrovascular atherosclerotic calcifications are noted.  The osseous structures are unremarkable.  IMPRESSION: 1. No acute intracranial pathology. 2. Chronic microvascular disease and cerebral atrophy.   Electronically Signed   By: Elige Ko   On: 01/13/2015 13:08   [name]  01/14/2015   CLINICAL DATA:   74 year old female with acute onset lower extremity weakness, found on floor this morning. Initial encounter.  EXAM: MRI HEAD WITHOUT CONTRAST  TECHNIQUE: Multiplanar, multiecho pulse sequences of the brain and surrounding structures were obtained without intravenous contrast.  COMPARISON:  Head CT without contrast 01/13/2015. Brain MRI 11/16/2014, and earlier  FINDINGS: No restricted diffusion or evidence of acute infarction. Major intracranial vascular flow voids are stable.  Advanced and widespread chronic small vessel ischemic changes re- demonstrated, including lacunar infarcts in the bilateral deep gray matter nuclei, left paracentral pons, both cerebellar hemispheres. Confluent bilateral cerebral white matter T2 and FLAIR hyperintensity. Wallace Cullens and white matter signal is stable since June. Scattered hemosiderin deposition re- demonstrated.  Trace mastoid fluid is new. Negative nasopharynx. Increased paranasal sinus mucosal thickening. Orbit and scalp soft tissues are stable. Normal bone marrow signal. Stable visualized cervical spine.  IMPRESSION: 1.  No acute intracranial abnormality. 2. 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   Electronically Signed   By: Odessa Fleming M.D.   On: 01/14/2015 15:59    Microbiology: No results found for this or any previous visit (from the past 240 hour(s)).  Labs: Basic Metabolic Panel:  Recent Labs Lab 01/13/15 1234 01/13/15 2023 01/14/15 0630  NA 140  --  137  K 4.0  --  3.6  CL 105  --  103  CO2 24  --  24  GLUCOSE 102*  --  88  BUN 17  --  11  CREATININE 0.87 0.85 0.88  CALCIUM 9.4  --  8.8*  MG  --  2.0  --   PHOS  --  4.0  --    Liver Function Tests:  Recent Labs Lab 01/13/15 2023  AST 44*  ALT 26  ALKPHOS 54  BILITOT 1.2  PROT 6.3*  ALBUMIN 3.4*   No results for input(s): LIPASE, AMYLASE in the last 168 hours. No results for input(s): AMMONIA in the last 168 hours. CBC:  Recent Labs Lab 01/13/15 1234  01/13/15 2023 01/14/15 0630  WBC 6.1 5.0 4.6  HGB 12.9 12.9 12.1  HCT 39.5 38.4 36.6  MCV 86.8 86.9 86.9  PLT 194 185 205   Cardiac Enzymes:  Recent Labs Lab 01/13/15 1234 01/14/15 0630  CKTOTAL 846* 584*  TROPONINI <0.03  --    BNP: BNP (last 3 results) No results for input(s): BNP in the last 8760 hours.  ProBNP (last 3 results) No results for input(s): PROBNP in the last 8760 hours.  CBG:  Recent Labs Lab 01/15/15 1215 01/15/15 1744 01/15/15 2141 01/16/15 0758 01/16/15 1205  GLUCAP 93 110* 108* 75 110*       Signed:  Mackay Hanauer  [facility] 01/16/2015, 12:15 PM

## 2015-01-16 NOTE — Clinical Social Work Note (Signed)
Clinical Social Worker continuing to follow patient and family for support and discharge planning needs.  Patient has been offered a bed at [facility] and will discharge 08/19 - Family and MD updated.  CSW received insurance authorization from [place] [phone]).  Patient family to provide transport to patient in the morning.  CSW remains available for support and to facilitate patient discharge needs.  Macario Golds, [place] [phone]

## 2015-01-16 NOTE — Progress Notes (Signed)
Physical Therapy Treatment Patient Details Name: [name] MRN: [id number] DOB: 10/04/1940 Today's Date: 01/16/2015    History of Present Illness TANESHA VULLO is a 75 y.o. female with Past medical history of CVA with residual right-sided weakness as well as facial droop and speech difficulty, hypertension, history of type 2 diabetes mellitus currently not on medication, dyslipidemia.  The patient is presenting with complaints of passing out episode.    PT Comments    Pt was admitted for R side weakness and cannot be admitted to CIR.  Her next best option is SNF but may not be able as she is observation admission.  Her plan remains at SNF for PT due to her weakness and dependence for care.  Follow Up Recommendations  SNF     Equipment Recommendations  Rolling walker with 5" wheels    Recommendations for Other Services Rehab consult     Precautions / Restrictions Precautions Precautions: Fall Restrictions Weight Bearing Restrictions: No    Mobility  Bed Mobility Overal bed mobility: Needs Assistance Bed Mobility: Supine to Sit     Supine to sit: Mod assist     General bed mobility comments: up when PT arrived  Transfers Overall transfer level: Needs assistance Equipment used: 1 person hand held assist Transfers: Sit to/from Stand Sit to Stand: Min assist         General transfer comment: chair transfers as pt up when PT entered  Ambulation/Gait             General Gait Details: Declined as she had been working in standing with OT prior to PT arrival   [employer] Rankin (Stroke Patients Only) Modified Rankin (Stroke Patients Only) Pre-Morbid Rankin Score: No significant disability Modified Rankin: Moderately severe disability     Balance Overall balance assessment: Needs assistance   Sitting balance-Leahy Scale: Fair   Postural control: Posterior lean   Standing balance-Leahy Scale:  Poor Standing balance comment: More aware of hand placement to control standing                    Cognition Arousal/Alertness: Awake/alert Behavior During Therapy: WFL for tasks assessed/performed Overall Cognitive Status: No family/caregiver present to determine baseline cognitive functioning                      Exercises General Exercises - Lower Extremity Ankle Circles/Pumps: AROM;Both;20 reps Quad Sets: AROM;Both;15 reps Gluteal Sets: AROM;Both;15 reps Long Arc Quad: Strengthening;Both;10 reps Heel Slides: Strengthening;Both;10 reps Hip ABduction/ADduction: Strengthening;Both;20 reps Hip Flexion/Marching: AROM;Both;10 reps    General Comments General comments (skin integrity, edema, etc.): Pt is more alert today and able to help PT with moving R side.  Her strength and control of R side allows for more independent effort today.      Pertinent Vitals/Pain Pain Assessment: No/denies pain    Home Living                      Prior Function            PT Goals (current goals can now be found in the care plan section) Acute Rehab PT Goals Patient Stated Goal: to get home Progress towards PT goals: Progressing toward goals    Frequency  Min 4X/week    PT Plan Discharge plan needs to be updated    Co-evaluation  End of Session   Activity Tolerance: Patient tolerated treatment well;Patient limited by fatigue Patient left: in chair;with call bell/phone within reach;Other (comment)     Time: 4132-4401 PT Time Calculation (min) (ACUTE ONLY): 26 min  Charges:  $Therapeutic Exercise: 8-22 mins $Therapeutic Activity: 8-22 mins                    G Codes:      Ivar Drape 02/06/2015, 12:23 PM   Samul Dada, PT MS Acute Rehab Dept. Number: [facility] [id number] and [facility] [phone]

## 2015-01-16 NOTE — Progress Notes (Signed)
OT Treatment Note  Pt making progress. Pt will need to D/C to SNF for rehab. Pt unsafe to be at home without 24/7 S. Pt had "first alert button" around her neck when she fell. When asked why she didn't push her button, she stated " I just didn't think about it". ALF is an excellent option for pt.     01/16/15 1100  OT Visit Information  Last OT Received On 01/16/15  Assistance Needed +2 (for ambulation)  History of Present Illness [name] is a 74 y.o. female with Past medical history of CVA with residual right-sided weakness as well as facial droop and speech difficulty, hypertension, history of type 2 diabetes mellitus currently not on medication, dyslipidemia.  The patient is presenting with complaints of passing out episode.  OT Time Calculation  OT Start Time (ACUTE ONLY) 1050  OT Stop Time (ACUTE ONLY) 1113  OT Time Calculation (min) 23 min  Precautions  Precautions Fall  Pain Assessment  Pain Assessment No/denies pain  Cognition  Arousal/Alertness Awake/alert  Behavior During Therapy WFL for tasks assessed/performed  Overall Cognitive Status No family/caregiver present to determine baseline cognitive functioning (most likely at baseline; decreased insight into safety)  ADL  Overall ADL's  Needs assistance/impaired  Toilet Transfer Minimal assistance  Toileting- Clothing Manipulation and Hygiene Maximal assistance;Sit to/from stand  Toileting - Clothing Manipulation Details (indicate cue type and reason) incontinenet of urine. Pt able to assist with changing pad,however, appers unsafe and at risk of falls during task  Functional mobility during ADLs Minimal assistance (stand pivot trnsfer)  General ADL Comments Pt has "her way" of doing things for "moving". Using funriture to suport self as she unsafely pivots and falls into chair.  Bed Mobility  Overal bed mobility Needs Assistance  Bed Mobility Supine to Sit  Supine to sit Mod assist  General bed mobility comments  assist to scoot anteriorly to EOB. Heavy use of rails.  Balance  Overall balance assessment Needs assistance;History of Falls  Sitting balance-Leahy Scale Fair  Postural control Posterior lean  Standing balance-Leahy Scale Poor  Transfers  Overall transfer level Needs assistance  Equipment used 1 person hand held assist  Transfers Stand Pivot Transfers  Sit to Stand Min assist  OT - End of Session  Equipment Utilized During Treatment Gait belt  Activity Tolerance Patient tolerated treatment well  Patient left in chair;with call bell/phone within reach  Nurse Communication Mobility status  OT Assessment/Plan  OT Plan Discharge plan needs to be updated  OT Frequency (ACUTE ONLY) Min 2X/week  Follow Up Recommendations Supervision/Assistance - 24 hour;SNF  OT Equipment 3 in 1 bedside comode;Tub/shower seat  OT Goal Progression  Progress towards OT goals Progressing toward goals  Acute Rehab OT Goals  Patient Stated Goal to go to rehab  OT Goal Formulation With patient  Time For Goal Achievement 01/28/15  Potential to Achieve Goals Fair  ADL Goals  Pt Will Perform Grooming with set-up;sitting;standing  Pt Will Perform Upper Body Bathing with set-up;sitting  Pt Will Perform Lower Body Bathing with supervision;sit to/from stand  Pt Will Perform Upper Body Dressing with set-up;sitting  Pt Will Perform Lower Body Dressing with supervision;with min assist;sit to/from stand  Pt Will Transfer to Toilet with supervision;with min guard assist;bedside commode  Pt Will Perform Toileting - Clothing Manipulation and hygiene with supervision;with min assist;sit to/from stand  OT General Charges  $OT Visit 1 Procedure  OT Treatments  $Self Care/Home Management  23-37 mins  [facility],  OTR/L  [phone] 01/16/2015

## 2015-01-17 DIAGNOSIS — I639 Cerebral infarction, unspecified: Secondary | ICD-10-CM | POA: Diagnosis not present

## 2015-01-17 DIAGNOSIS — R05 Cough: Secondary | ICD-10-CM | POA: Diagnosis not present

## 2015-01-17 DIAGNOSIS — E119 Type 2 diabetes mellitus without complications: Secondary | ICD-10-CM | POA: Diagnosis not present

## 2015-01-17 DIAGNOSIS — R55 Syncope and collapse: Secondary | ICD-10-CM | POA: Diagnosis not present

## 2015-01-17 DIAGNOSIS — Z5189 Encounter for other specified aftercare: Secondary | ICD-10-CM | POA: Diagnosis not present

## 2015-01-17 DIAGNOSIS — I10 Essential (primary) hypertension: Secondary | ICD-10-CM | POA: Diagnosis not present

## 2015-01-17 DIAGNOSIS — G40909 Epilepsy, unspecified, not intractable, without status epilepticus: Secondary | ICD-10-CM | POA: Diagnosis not present

## 2015-01-17 DIAGNOSIS — R278 Other lack of coordination: Secondary | ICD-10-CM | POA: Diagnosis not present

## 2015-01-17 DIAGNOSIS — R41841 Cognitive communication deficit: Secondary | ICD-10-CM | POA: Diagnosis not present

## 2015-01-17 DIAGNOSIS — M6281 Muscle weakness (generalized): Secondary | ICD-10-CM | POA: Diagnosis not present

## 2015-01-17 DIAGNOSIS — M6282 Rhabdomyolysis: Secondary | ICD-10-CM | POA: Diagnosis not present

## 2015-01-17 DIAGNOSIS — E780 Pure hypercholesterolemia: Secondary | ICD-10-CM | POA: Diagnosis not present

## 2015-01-17 DIAGNOSIS — Z8673 Personal history of transient ischemic attack (TIA), and cerebral infarction without residual deficits: Secondary | ICD-10-CM | POA: Diagnosis not present

## 2015-01-17 DIAGNOSIS — R5381 Other malaise: Secondary | ICD-10-CM | POA: Diagnosis not present

## 2015-01-17 DIAGNOSIS — E785 Hyperlipidemia, unspecified: Secondary | ICD-10-CM | POA: Diagnosis not present

## 2015-01-17 DIAGNOSIS — R569 Unspecified convulsions: Secondary | ICD-10-CM | POA: Diagnosis not present

## 2015-01-17 DIAGNOSIS — I69322 Dysarthria following cerebral infarction: Secondary | ICD-10-CM | POA: Diagnosis not present

## 2015-01-17 DIAGNOSIS — R9401 Abnormal electroencephalogram [EEG]: Secondary | ICD-10-CM | POA: Diagnosis not present

## 2015-01-17 DIAGNOSIS — I69351 Hemiplegia and hemiparesis following cerebral infarction affecting right dominant side: Secondary | ICD-10-CM | POA: Diagnosis not present

## 2015-01-17 DIAGNOSIS — Z9181 History of falling: Secondary | ICD-10-CM | POA: Diagnosis not present

## 2015-01-17 DIAGNOSIS — R2681 Unsteadiness on feet: Secondary | ICD-10-CM | POA: Diagnosis not present

## 2015-01-17 DIAGNOSIS — K5900 Constipation, unspecified: Secondary | ICD-10-CM | POA: Diagnosis not present

## 2015-01-17 LAB — GLUCOSE, CAPILLARY
GLUCOSE-CAPILLARY: 102 mg/dL — AB (ref 65–99)
GLUCOSE-CAPILLARY: 70 mg/dL (ref 65–99)
GLUCOSE-CAPILLARY: 122 mg/dL — AB (ref 65–99)

## 2015-01-17 NOTE — Clinical Social Work Placement (Signed)
   CLINICAL SOCIAL WORK PLACEMENT  NOTE  Date:  01/17/2015  Patient Details  Name: [name] MRN: [id number] Date of Birth: 1941/02/15  Clinical Social Work is seeking post-discharge placement for this patient at the Skilled  Nursing Facility level of care (*CSW will initial, date and re-position this form in  chart as items are completed):  Yes   Patient/family provided with [facility] Clinical Social Work Department's list of facilities offering this level of care within the geographic area requested by the patient (or if unable, by the patient's family).  Yes   Patient/family informed of their freedom to choose among providers that offer the needed level of care, that participate in Medicare, Medicaid or managed care program needed by the patient, have an available bed and are willing to accept the patient.  Yes   Patient/family informed of [facility]'s ownership interest in [facility] and [facility], as well as of the fact that they are under no obligation to receive care at these facilities.  PASRR submitted to EDS on       PASRR number received on       Existing PASRR number confirmed on 01/15/15     FL2 transmitted to all facilities in geographic area requested by pt/family on 01/15/15     FL2 transmitted to all facilities within larger geographic area on       Patient informed that his/her managed care company has contracts with or will negotiate with certain facilities, including the following:        Yes   Patient/family informed of bed offers received.  Patient chooses bed at [facility]     Physician recommends and patient chooses bed at      Patient to be transferred to [facility] on 01/17/15.  Patient to be transferred to facility by Ambulance     Patient family notified on 01/17/15 of transfer.  Name of family member notified:  [facility]      PHYSICIAN     Additional Comment:   Per MD patient ready for DC to [facility]. RN,  patient, patient's family Aileen Pilot Chancellor), and facility notified of DC. RN given number for report. DC packet on chart. Ambulance transport requested for patient for 2:00PM. CSW signing off.    Roddie Mc MSW, [place], [place], [phone] _______________________________________________ Venita Lick, LCSW 01/17/2015, 12:29 PM

## 2015-01-17 NOTE — Clinical Social Work Note (Signed)
Per RN, family has opted to transport patient to SNF. CSW instructed RN to give family patient's DC packet to give to [facility] upon arrival. CSW signing off.   Roddie Mc MSW, [place], [place], [phone]

## 2015-01-17 NOTE — Care Management Note (Signed)
Case Management Note  Patient Details  Name: [name] MRN: [id number] Date of Birth: Oct 26, 1940  Subjective/Objective:                 Patient from home with family. Experienced syncopal episode. Patient to be evaluated by PT. Has a history of CVA. Patient requested new rolator, stating hers is held together with duct tape. She states that it is well over 73 years old.  01-17-15 Patient DC to SNF- [facility].   Action/Plan:   Expected Discharge Date:                  Expected Discharge Plan:  Skilled Nursing Facility  In-House Referral:  Clinical Social Work  Discharge planning Services  CM Consult  Post Acute Care Choice:    Choice offered to:     DME Arranged:    DME Agency:     HH Arranged:    HH Agency:     Status of Service:  Completed, signed off  Medicare Important Message Given:    Date Medicare IM Given:    Medicare IM give by:    Date Additional Medicare IM Given:    Additional Medicare Important Message give by:     If discussed at Long Length of Stay Meetings, dates discussed:    Additional Comments:  Lawerance Sabal, RN 01/17/2015, 11:19 AM

## 2015-01-17 NOTE — Progress Notes (Signed)
Patient discharged to [facility] and transported by her caregiver. IV site removed. Discharge instruction packet sent by caregiver. Patient stable. Report called to Clydie Braun at [facility].

## 2015-01-20 ENCOUNTER — Encounter: Payer: Self-pay | Admitting: Adult Health

## 2015-01-20 ENCOUNTER — Non-Acute Institutional Stay: Payer: Commercial Managed Care - HMO | Admitting: Adult Health

## 2015-01-20 DIAGNOSIS — R569 Unspecified convulsions: Secondary | ICD-10-CM

## 2015-01-20 DIAGNOSIS — I10 Essential (primary) hypertension: Secondary | ICD-10-CM

## 2015-01-20 DIAGNOSIS — E785 Hyperlipidemia, unspecified: Secondary | ICD-10-CM

## 2015-01-20 DIAGNOSIS — R5381 Other malaise: Secondary | ICD-10-CM

## 2015-01-20 DIAGNOSIS — E119 Type 2 diabetes mellitus without complications: Secondary | ICD-10-CM | POA: Diagnosis not present

## 2015-01-20 DIAGNOSIS — R55 Syncope and collapse: Secondary | ICD-10-CM | POA: Diagnosis not present

## 2015-01-20 DIAGNOSIS — Z8673 Personal history of transient ischemic attack (TIA), and cerebral infarction without residual deficits: Secondary | ICD-10-CM

## 2015-01-21 LAB — BASIC METABOLIC PANEL
BUN: 22 mg/dL — AB (ref 4–21)
CREATININE: 0.9 mg/dL (ref 0.5–1.1)
Glucose: 79 mg/dL
Potassium: 4.6 mmol/L (ref 3.4–5.3)
Sodium: 146 mmol/L (ref 137–147)

## 2015-01-21 LAB — CBC AND DIFFERENTIAL
HEMATOCRIT: 38 % (ref 36–46)
Hemoglobin: 12.2 g/dL (ref 12.0–16.0)
PLATELETS: 334 10*3/uL (ref 150–399)
WBC: 5.6 10^3/mL

## 2015-01-21 NOTE — Progress Notes (Signed)
Patient ID: [name], female   DOB: 1941-03-11, 74 y.o.   MRN: [id number]    DATE:  01/20/15 MRN:  [id number]  BIRTHDAY: Apr 03, 1941  Facility:  Nursing Home Location:  [facility]  Nursing Home Room Number: 903-2  LEVEL OF CARE:  SNF [phone])  Contact Information    Name Relation Home Work Mobile   [place] Other [phone]  [phone]   Janina Mayo [phone]     [place] Other [phone]     Sellers,Clarithia Other [phone]     [place]   [phone]       Chief Complaint  Patient presents with  . Hospitalization Follow-up    HISTORY OF PRESENT ILLNESS:  This is a88 year old female who has been admitted to [facility] on 01/17/15 from [facility]. 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 She had a syncopal episode. CT scan of the head was negative for acute intracranial abnormalities. EEG was abnormal. Neurology was consulted and Keppra was added.  She has been admitted for a short-term rehabilitation.  PAST MEDICAL HISTORY:  Past Medical History  Diagnosis Date  . Stroke   . Hypertension   . Hypercholesteremia   . Diabetes mellitus      CURRENT MEDICATIONS: Reviewed  Patient's Medications  New Prescriptions   No medications on file  Previous Medications   BISACODYL (DULCOLAX) 5 MG EC TABLET    Take 5 mg by mouth daily as needed. For constipation   DIPYRIDAMOLE-ASPIRIN (AGGRENOX) 25-200 MG PER 12 HR CAPSULE    Take 1 capsule by mouth 2 (two) times daily.   HYDRALAZINE (APRESOLINE) 50 MG TABLET    Take 1 tablet (50 mg total) by mouth every 8 (eight) hours.   LATANOPROST (XALATAN) 0.005 % OPHTHALMIC SOLUTION    Place 1 drop into both eyes at bedtime as needed.   LEVETIRACETAM (KEPPRA) 500 MG TABLET    Take 1 tablet (500 mg total) by mouth 2 (two) times daily.   LISINOPRIL  (PRINIVIL,ZESTRIL) 20 MG TABLET    Take 20 mg by mouth 2 (two) times daily.   ROSUVASTATIN (CRESTOR) 5 MG TABLET    Take 5 mg by mouth daily.  Modified Medications   No medications on file  Discontinued Medications   No medications on file    No Known Allergies  REVIEW OF SYSTEMS:  GENERAL: no change in appetite, no fatigue, no weight changes, no fever, chills or weakness EYES: Denies change in vision, dry eyes, eye pain, itching or discharge EARS: Denies change in hearing, ringing in ears, or earache NOSE: Denies nasal congestion or epistaxis MOUTH and THROAT: Denies oral discomfort, gingival pain or bleeding, pain from teeth or hoarseness   RESPIRATORY: no cough, SOB, DOE, wheezing, hemoptysis CARDIAC: no chest pain, edema or palpitations GI: no abdominal pain, diarrhea, constipation, heart burn, nausea or vomiting GU: Denies dysuria, frequency, hematuria, incontinence, or discharge MUSCULOSKELETAL: Denies joit pain, muscle pain, back pain, restricted movement, or unusual weakness PSYCHIATRIC: Denies feeling of depression or anxiety. No report of hallucinations, insomnia, paranoia, or agitation   PHYSICAL EXAMINATION  GENERAL APPEARANCE: Well nourished. In no acute distress. Normal body habitus HEAD: Normal in size and contour. No evidence of trauma EYES: Lids open and close normally. No blepharitis, entropion or ectropion. PERRL. Conjunctivae are clear and sclerae are white. Lenses are without opacity EARS: Pinnae are normal. Patient hears normal voice tunes of  the examiner MOUTH and THROAT: Lips are without lesions. Oral mucosa is moist and without lesions. Tongue is normal in shape, size, and color and without lesions NECK: supple, trachea midline, no neck masses, no thyroid tenderness, no thyromegaly LYMPHATICS: no LAN in the neck, no supraclavicular LAN RESPIRATORY: breathing is even & unlabored, BS CTAB CARDIAC: RRR, no murmur,no extra heart sounds, no edema GI: abdomen  soft, normal BS, no masses, no tenderness, no hepatomegaly, no splenomegaly EXTREMITIES:  Right-sided weakness; able to move X 4 extremities CIRCULATION: pedal pulses are 2+. There is no edema of the legs, ankles and feet NEUROLOGICAL: There is no tremor. Speech is dysarthric PSYCHIATRIC: Alert and oriented X 3. Affect and behavior are appropriate  LABS/RADIOLOGY: Labs reviewed: Basic Metabolic Panel:  Recent Labs  16/10/96 0542 01/13/15 1234 01/13/15 2023 01/14/15 0630  NA 139 140  --  137  K 3.7 4.0  --  3.6  CL 106 105  --  103  CO2 26 24  --  24  GLUCOSE 101* 102*  --  88  BUN 15 17  --  11  CREATININE 0.99 0.87 0.85 0.88  CALCIUM 9.0 9.4  --  8.8*  MG  --   --  2.0  --   PHOS  --   --  4.0  --    Liver Function Tests:  Recent Labs  11/15/14 1631 11/16/14 0542 01/13/15 2023  AST 21 19 44*  ALT 14 12* 26  ALKPHOS 59 55 54  BILITOT 0.5 0.5 1.2  PROT 6.4* 5.9* 6.3*  ALBUMIN 3.6 3.1* 3.4*   CBC:  Recent Labs  11/15/14 1543 11/16/14 0542 01/13/15 1234 01/13/15 2023 01/14/15 0630  WBC 5.9 5.6 6.1 5.0 4.6  NEUTROABS 4.4 3.6  --   --   --   HGB 13.3 11.2* 12.9 12.9 12.1  HCT 41.0 34.2* 39.5 38.4 36.6  MCV 86.5 85.5 86.8 86.9 86.9  PLT 257 216 194 185 205   Lipid Panel:  Recent Labs  01/14/15 0630  HDL 69   Cardiac Enzymes:  Recent Labs  01/13/15 1234 01/14/15 0630  CKTOTAL 846* 584*  TROPONINI <0.03  --    CBG:  Recent Labs  01/16/15 2122 01/17/15 0812 01/17/15 1205  GLUCAP 102* 70 122*     Ct Head Wo Contrast  01/13/2015   CLINICAL DATA:  Unresponsive  EXAM: CT HEAD WITHOUT CONTRAST  TECHNIQUE: Contiguous axial images were obtained from the base of the skull through the vertex without intravenous contrast.  COMPARISON:  11/16/2014  FINDINGS: There is no evidence of mass effect, midline shift, or extra-axial fluid collections. There is no evidence of a space-occupying lesion or intracranial hemorrhage. There is no evidence of a  cortical-based area of acute infarction. There is generalized cerebral atrophy. There is periventricular white matter low attenuation likely secondary to microangiopathy.  The ventricles and sulci are appropriate for the patient's age. The basal cisterns are patent.  Visualized portions of the orbits are unremarkable. The visualized portions of the paranasal sinuses and mastoid air cells are unremarkable. Cerebrovascular atherosclerotic calcifications are noted.  The osseous structures are unremarkable.  IMPRESSION: 1. No acute intracranial pathology. 2. Chronic microvascular disease and cerebral atrophy.   Electronically Signed   By: Elige Ko   On: 01/13/2015 13:08   [name]  01/14/2015   CLINICAL DATA:  74 year old female with acute onset lower extremity weakness, found on floor this morning. Initial encounter.  EXAM: MRI HEAD WITHOUT  CONTRAST  TECHNIQUE: Multiplanar, multiecho pulse sequences of the brain and surrounding structures were obtained without intravenous contrast.  COMPARISON:  Head CT without contrast 01/13/2015. Brain MRI 11/16/2014, and earlier  FINDINGS: No restricted diffusion or evidence of acute infarction. Major intracranial vascular flow voids are stable.  Advanced and widespread chronic small vessel ischemic changes re- demonstrated, including lacunar infarcts in the bilateral deep gray matter nuclei, left paracentral pons, both cerebellar hemispheres. Confluent bilateral cerebral white matter T2 and FLAIR hyperintensity. Wallace Cullens and white matter signal is stable since June. Scattered hemosiderin deposition re- demonstrated.  Trace mastoid fluid is new. Negative nasopharynx. Increased paranasal sinus mucosal thickening. Orbit and scalp soft tissues are stable. Normal bone marrow signal. Stable visualized cervical spine.  IMPRESSION: 1.  No acute intracranial abnormality. 2. 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   Electronically Signed   By: Odessa Fleming  M.D.   On: 01/14/2015 15:59    ASSESSMENT/PLAN:  Physical deconditioning - for rehabilitation  Syncope - probably related to seizure; CT head negative  Seizure - continue Keppra 500 mg 1 tab by mouth twice a day  History of CVA - for rehabilitation; continue Aggrenox 25-200 milligrams 24 hour 1 capsule by mouth twice a day; check CBC  Hyperlipidemia - continue Crestor 5 mg 1 tab by mouth daily  Diabetes mellitus, type II - hemoglobin A1c 5.7; diet-controlled  Hypertension - well controlled; continue hydralazine 50 mg 1 tab by mouth every 8 hours and lisinopril 20 mg 1 tab by mouth twice a day; check BMP    Goals of care:  Short-term rehabilitation    [facility], NP [facility] [phone]

## 2015-01-23 ENCOUNTER — Non-Acute Institutional Stay (SKILLED_NURSING_FACILITY): Payer: Commercial Managed Care - HMO | Admitting: Internal Medicine

## 2015-01-23 DIAGNOSIS — R5381 Other malaise: Secondary | ICD-10-CM

## 2015-01-23 DIAGNOSIS — R05 Cough: Secondary | ICD-10-CM

## 2015-01-23 DIAGNOSIS — E119 Type 2 diabetes mellitus without complications: Secondary | ICD-10-CM

## 2015-01-23 DIAGNOSIS — I10 Essential (primary) hypertension: Secondary | ICD-10-CM

## 2015-01-23 DIAGNOSIS — K5900 Constipation, unspecified: Secondary | ICD-10-CM | POA: Diagnosis not present

## 2015-01-23 DIAGNOSIS — R059 Cough, unspecified: Secondary | ICD-10-CM

## 2015-01-23 DIAGNOSIS — I639 Cerebral infarction, unspecified: Secondary | ICD-10-CM | POA: Diagnosis not present

## 2015-01-23 DIAGNOSIS — G40909 Epilepsy, unspecified, not intractable, without status epilepticus: Secondary | ICD-10-CM

## 2015-01-23 DIAGNOSIS — R55 Syncope and collapse: Secondary | ICD-10-CM | POA: Diagnosis not present

## 2015-01-23 NOTE — Progress Notes (Signed)
Patient ID: [name], female   DOB: 1940/12/31, 74 y.o.   MRN: [id number]     [facility]   PCP: Georgann Housekeeper, MD  Code Status: full code  No Known Allergies  Chief Complaint  Patient presents with  . New Admit To SNF     HPI:  74 y.o. patient is here for short term rehabilitation post hospital admission from 01/13/15-01/16/15 post syncope. She was diagnosed to have seizure noted on EEG. Neurology was consulted and she was started on keppra. Her cardiac workup and stroke workup was negative. She has PMH of HTN, HLD, CVA with right sided weakness. She is seen in her room today. She complaints of cough mostly dry, says robitussin has worked in past and would like to try it. She has bene constipated, had a small bowel movement yesterday but not a good one.  Review of Systems:  Constitutional: Negative for fever, chills, diaphoresis.  HENT: Negative for headache, congestion, nasal discharge   Eyes: Negative for eye pain, blurred vision, double vision and discharge.  Respiratory: Negative for shortness of breath and wheezing.   Cardiovascular: Negative for chest pain, palpitations, leg swelling.  Gastrointestinal: Negative for heartburn, nausea, vomiting, abdominal pain Genitourinary: Negative for dysuria, flank pain.  Musculoskeletal: Negative for back pain, falls. Has right sided weakness chronic post old cva Skin: Negative for itching, rash.  Neurological: Negative for dizziness, tingling, focal weakness Psychiatric/Behavioral: Negative for depression   Past Medical History  Diagnosis Date  . Stroke   . Hypertension   . Hypercholesteremia   . Diabetes mellitus    Past Surgical History  Procedure Laterality Date  . Tooth extraction     Social History:   reports that she has never smoked. She has never used smokeless tobacco. She reports that she does not drink alcohol or use illicit drugs.  Family History  Problem Relation Age of  Onset  . Diabetes type II    . Hypertension      Medications:   Medication List       This list is accurate as of: 01/23/15 12:23 PM.  Always use your most recent med list.               bisacodyl 5 MG EC tablet  Commonly known as:  DULCOLAX  Take 5 mg by mouth daily as needed. For constipation     dipyridamole-aspirin 200-25 MG per 12 hr capsule  Commonly known as:  AGGRENOX  Take 1 capsule by mouth 2 (two) times daily.     hydrALAZINE 50 MG tablet  Commonly known as:  APRESOLINE  Take 1 tablet (50 mg total) by mouth every 8 (eight) hours.     latanoprost 0.005 % ophthalmic solution  Commonly known as:  XALATAN  Place 1 drop into both eyes at bedtime as needed.     levETIRAcetam 500 MG tablet  Commonly known as:  KEPPRA  Take 1 tablet (500 mg total) by mouth 2 (two) times daily.     lisinopril 20 MG tablet  Commonly known as:  PRINIVIL,ZESTRIL  Take 20 mg by mouth 2 (two) times daily.     rosuvastatin 5 MG tablet  Commonly known as:  CRESTOR  Take 5 mg by mouth daily.         Physical Exam: Filed Vitals:   01/23/15 1222  BP: 142/60  Pulse: 70  Temp: 98.6 F (37 C)  Resp: 16  SpO2: 96%    General- elderly  female, in no acute distress Head- normocephalic, atraumatic Nose- normal nasal mucosa, no maxillary or frontal sinus tenderness, no nasal discharge Throat- moist mucus membrane  Eyes- PERRLA, EOMI, no pallor, no icterus, no discharge, normal conjunctiva, normal sclera Neck- no cervical lymphadenopathy Cardiovascular- normal s1,s2, no murmurs, no leg edema Respiratory- bilateral clear to auscultation, no wheeze, no rhonchi, no crackles, no use of accessory muscles Abdomen- bowel sounds present, soft, non tender Musculoskeletal- able to move all 4 extremities, generalized weakness right > left  Neurological- no focal deficit, alert and oriented, dysarthria present Skin- warm and dry Psychiatry- normal mood and affect    Labs reviewed: Basic  Metabolic Panel:  Recent Labs  45/40/98 0542 01/13/15 1234 01/13/15 2023 01/14/15 0630  NA 139 140  --  137  K 3.7 4.0  --  3.6  CL 106 105  --  103  CO2 26 24  --  24  GLUCOSE 101* 102*  --  88  BUN 15 17  --  11  CREATININE 0.99 0.87 0.85 0.88  CALCIUM 9.0 9.4  --  8.8*  MG  --   --  2.0  --   PHOS  --   --  4.0  --    Liver Function Tests:  Recent Labs  11/15/14 1631 11/16/14 0542 01/13/15 2023  AST 21 19 44*  ALT 14 12* 26  ALKPHOS 59 55 54  BILITOT 0.5 0.5 1.2  PROT 6.4* 5.9* 6.3*  ALBUMIN 3.6 3.1* 3.4*   No results for input(s): LIPASE, AMYLASE in the last 8760 hours. No results for input(s): AMMONIA in the last 8760 hours. CBC:  Recent Labs  11/15/14 1543 11/16/14 0542 01/13/15 1234 01/13/15 2023 01/14/15 0630  WBC 5.9 5.6 6.1 5.0 4.6  NEUTROABS 4.4 3.6  --   --   --   HGB 13.3 11.2* 12.9 12.9 12.1  HCT 41.0 34.2* 39.5 38.4 36.6  MCV 86.5 85.5 86.8 86.9 86.9  PLT 257 216 194 185 205   Cardiac Enzymes:  Recent Labs  01/13/15 1234 01/14/15 0630  CKTOTAL 846* 584*  TROPONINI <0.03  --    BNP: Invalid input(s): POCBNP CBG:  Recent Labs  01/16/15 2122 01/17/15 0812 01/17/15 1205  GLUCAP 102* 70 122*    Radiological Exams: Ct Head Wo Contrast  01/13/2015   CLINICAL DATA:  Unresponsive  EXAM: CT HEAD WITHOUT CONTRAST  TECHNIQUE: Contiguous axial images were obtained from the base of the skull through the vertex without intravenous contrast.  COMPARISON:  11/16/2014  FINDINGS: There is no evidence of mass effect, midline shift, or extra-axial fluid collections. There is no evidence of a space-occupying lesion or intracranial hemorrhage. There is no evidence of a cortical-based area of acute infarction. There is generalized cerebral atrophy. There is periventricular white matter low attenuation likely secondary to microangiopathy.  The ventricles and sulci are appropriate for the patient's age. The basal cisterns are patent.  Visualized portions  of the orbits are unremarkable. The visualized portions of the paranasal sinuses and mastoid air cells are unremarkable. Cerebrovascular atherosclerotic calcifications are noted.  The osseous structures are unremarkable.  IMPRESSION: 1. No acute intracranial pathology. 2. Chronic microvascular disease and cerebral atrophy.   Electronically Signed   By: Elige Ko   On: 01/13/2015 13:08   [name]  01/14/2015   CLINICAL DATA:  74 year old female with acute onset lower extremity weakness, found on floor this morning. Initial encounter.  EXAM: MRI HEAD WITHOUT CONTRAST  TECHNIQUE:  Multiplanar, multiecho pulse sequences of the brain and surrounding structures were obtained without intravenous contrast.  COMPARISON:  Head CT without contrast 01/13/2015. Brain MRI 11/16/2014, and earlier  FINDINGS: No restricted diffusion or evidence of acute infarction. Major intracranial vascular flow voids are stable.  Advanced and widespread chronic small vessel ischemic changes re- demonstrated, including lacunar infarcts in the bilateral deep gray matter nuclei, left paracentral pons, both cerebellar hemispheres. Confluent bilateral cerebral white matter T2 and FLAIR hyperintensity. Wallace Cullens and white matter signal is stable since June. Scattered hemosiderin deposition re- demonstrated.  Trace mastoid fluid is new. Negative nasopharynx. Increased paranasal sinus mucosal thickening. Orbit and scalp soft tissues are stable. Normal bone marrow signal. Stable visualized cervical spine.  IMPRESSION: 1.  No acute intracranial abnormality. 2. 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   Electronically Signed   By: Odessa Fleming M.D.   On: 01/14/2015 15:59    Assessment/Plan  Physical deconditioning Will have her work with physical therapy and occupational therapy team to help with gait training and muscle strengthening exercises.fall precautions. Skin care. Encourage to be out of bed.   Syncope Post  seizure, no further syncopal episode. Monitor vital signs  Seizure disorder Stable, seizure prophylaxis, continue keppra 500 mg bid and monitor  CVA Continue aggrenox and lipitor 20 mg daily in place of crestor for formulary reason. To work with therapy team. Fall prevention  Dysarthria Post old CVA, working with SLP team for dysarthria and cognition  HTN Stable bp. Continue hydralazine 50 mg tid and lisinopril 20 mg bid for now  Constipation Currently on prn dulcolax suppository. Add senna s 2 tab qhs with daily miralax and reassess  Diabetes mellitus a1c 5.7, diet controlled. Continue statin and ACEI  Cough Possible URI, clear lung exam, start robitussin 10 cc bid for a week and reassess   Goals of care: short term rehabilitation   Labs/tests ordered: cbc, bmp 1 week  Family/ staff Communication: reviewed care plan with patient and nursing supervisor    Oneal Grout, MD  [facility] [phone] (Monday-Friday 8 am - 5 pm) [phone] (afterhours)

## 2015-01-31 ENCOUNTER — Non-Acute Institutional Stay: Payer: Commercial Managed Care - HMO | Admitting: Adult Health

## 2015-01-31 DIAGNOSIS — G40909 Epilepsy, unspecified, not intractable, without status epilepticus: Secondary | ICD-10-CM | POA: Diagnosis not present

## 2015-01-31 DIAGNOSIS — Z8673 Personal history of transient ischemic attack (TIA), and cerebral infarction without residual deficits: Secondary | ICD-10-CM | POA: Diagnosis not present

## 2015-01-31 DIAGNOSIS — K5900 Constipation, unspecified: Secondary | ICD-10-CM | POA: Diagnosis not present

## 2015-01-31 DIAGNOSIS — E119 Type 2 diabetes mellitus without complications: Secondary | ICD-10-CM

## 2015-01-31 DIAGNOSIS — E785 Hyperlipidemia, unspecified: Secondary | ICD-10-CM | POA: Diagnosis not present

## 2015-01-31 DIAGNOSIS — I10 Essential (primary) hypertension: Secondary | ICD-10-CM | POA: Diagnosis not present

## 2015-01-31 DIAGNOSIS — R5381 Other malaise: Secondary | ICD-10-CM | POA: Diagnosis not present

## 2015-02-04 NOTE — Progress Notes (Signed)
Patient ID: [name], female   DOB: 03/20/1941, 74 y.o.   MRN: [id number]    DATE:  01/31/15 MRN:  [id number]  BIRTHDAY: 1940/08/12  Facility:  Nursing Home Location:  [facility]  Nursing Home Room Number: 903-2  LEVEL OF CARE:  SNF [phone])  Contact Information    Name Relation Home Work Mobile   Oatman Other [phone]  [phone]   Janina Mayo [phone]     Roy Other [phone]     Sellers,Clarithia Other [phone]     Burns Spain   [phone]       Chief Complaint  Patient presents with  . Discharge Note    Physical deconditioning, seizure, history of CVA, hyperlipidemia, diabetes mellitus and hypertension    HISTORY OF PRESENT ILLNESS:  This is a89 year old female who is for discharge home with Home health PT, OT, SW, ST and CNA. She has been admitted to [facility] on 01/17/15 from [facility]. 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 She had a syncopal episode. CT scan of the head was negative for acute intracranial abnormalities. EEG was abnormal. Neurology was consulted and Keppra was added.  Patient was admitted to this facility for short-term rehabilitation after the patient's recent hospitalization.  Patient has completed SNF rehabilitation and therapy has cleared the patient for discharge.  PAST MEDICAL HISTORY:  Past Medical History  Diagnosis Date  . Stroke   . Hypertension   . Hypercholesteremia   . Diabetes mellitus      CURRENT MEDICATIONS: Reviewed  Patient's Medications  New Prescriptions   No medications on file  Previous Medications   ATORVASTATIN (LIPITOR) 20 MG TABLET    Take 20 mg by mouth daily. For high cholesterol   BISACODYL (DULCOLAX) 5 MG EC TABLET    Take 5 mg by mouth daily as needed. For constipation   DIPYRIDAMOLE-ASPIRIN (AGGRENOX)  25-200 MG PER 12 HR CAPSULE    Take 1 capsule by mouth 2 (two) times daily.   GUAIFENESIN (ROBITUSSIN) 100 MG/5ML SYRUP    10 cc by mouth twice daily x 1 week, end 01/31/15   HYDRALAZINE (APRESOLINE) 50 MG TABLET    Take 1 tablet (50 mg total) by mouth every 8 (eight) hours.   LATANOPROST (XALATAN) 0.005 % OPHTHALMIC SOLUTION    Place 1 drop into both eyes at bedtime as needed.   LEVETIRACETAM (KEPPRA) 500 MG TABLET    Take 1 tablet (500 mg total) by mouth 2 (two) times daily.   LISINOPRIL (PRINIVIL,ZESTRIL) 20 MG TABLET    Take 20 mg by mouth 2 (two) times daily.   POLYETHYLENE GLYCOL (MIRALAX / GLYCOLAX) PACKET    Take 17 g by mouth daily.   SENNOSIDES-DOCUSATE SODIUM (SENOKOT-S) 8.6-50 MG TABLET    Take 2 tablets by mouth at bedtime.  Modified Medications   No medications on file  Discontinued Medications   No medications on file    No Known Allergies  REVIEW OF SYSTEMS:  GENERAL: no change in appetite, no fatigue, no weight changes, no fever, chills  EYES: Denies change in vision, dry eyes, eye pain, itching or discharge EARS: Denies change in hearing, ringing in ears, or earache NOSE: Denies nasal congestion or epistaxis MOUTH and THROAT: Denies oral discomfort, gingival pain or bleeding, pain from teeth or hoarseness   RESPIRATORY: no cough, SOB, DOE, wheezing, hemoptysis CARDIAC: no chest pain, edema or palpitations  GI: no abdominal pain, diarrhea, constipation, heart burn, nausea or vomiting GU: Denies dysuria, frequency, hematuria, incontinence, or discharge PSYCHIATRIC: Denies feeling of depression or anxiety. No report of hallucinations, insomnia, paranoia, or agitation   PHYSICAL EXAMINATION  GENERAL APPEARANCE: Well nourished. In no acute distress. Normal body habitus HEAD: Normal in size and contour. No evidence of trauma EYES: Lids open and close normally. No blepharitis, entropion or ectropion. PERRL. Conjunctivae are clear and sclerae are white. Lenses are without  opacity EARS: Pinnae are normal. Patient hears normal voice tunes of the examiner MOUTH and THROAT: Lips are without lesions. Oral mucosa is moist and without lesions. Tongue is normal in shape, size, and color and without lesions NECK: supple, trachea midline, no neck masses, no thyroid tenderness, no thyromegaly LYMPHATICS: no LAN in the neck, no supraclavicular LAN RESPIRATORY: breathing is even & unlabored, BS CTAB CARDIAC: RRR, no murmur,no extra heart sounds, no edema GI: abdomen soft, normal BS, no masses, no tenderness, no hepatomegaly, no splenomegaly EXTREMITIES:  Right-sided weakness; able to move X 4 extremities PSYCHIATRIC: Alert and oriented X 3. Affect and behavior are appropriate  LABS/RADIOLOGY: Labs reviewed: Basic Metabolic Panel:  Recent Labs  16/10/96 0542 01/13/15 1234 01/13/15 2023 01/14/15 0630 01/21/15  NA 139 140  --  137 146  K 3.7 4.0  --  3.6 4.6  CL 106 105  --  103  --   CO2 26 24  --  24  --   GLUCOSE 101* 102*  --  88  --   BUN 15 17  --  11 22*  CREATININE 0.99 0.87 0.85 0.88 0.9  CALCIUM 9.0 9.4  --  8.8*  --   MG  --   --  2.0  --   --   PHOS  --   --  4.0  --   --    Liver Function Tests:  Recent Labs  11/15/14 1631 11/16/14 0542 01/13/15 2023  AST 21 19 44*  ALT 14 12* 26  ALKPHOS 59 55 54  BILITOT 0.5 0.5 1.2  PROT 6.4* 5.9* 6.3*  ALBUMIN 3.6 3.1* 3.4*   CBC:  Recent Labs  11/15/14 1543 11/16/14 0542 01/13/15 1234 01/13/15 2023 01/14/15 0630 01/21/15  WBC 5.9 5.6 6.1 5.0 4.6 5.6  NEUTROABS 4.4 3.6  --   --   --   --   HGB 13.3 11.2* 12.9 12.9 12.1 12.2  HCT 41.0 34.2* 39.5 38.4 36.6 38  MCV 86.5 85.5 86.8 86.9 86.9  --   PLT 257 216 194 185 205 334   Lipid Panel:  Recent Labs  01/14/15 0630  HDL 69   Cardiac Enzymes:  Recent Labs  01/13/15 1234 01/14/15 0630  CKTOTAL 846* 584*  TROPONINI <0.03  --    CBG:  Recent Labs  01/16/15 2122 01/17/15 0812 01/17/15 1205  GLUCAP 102* 70 122*     Ct  Head Wo Contrast  01/13/2015   CLINICAL DATA:  Unresponsive  EXAM: CT HEAD WITHOUT CONTRAST  TECHNIQUE: Contiguous axial images were obtained from the base of the skull through the vertex without intravenous contrast.  COMPARISON:  11/16/2014  FINDINGS: There is no evidence of mass effect, midline shift, or extra-axial fluid collections. There is no evidence of a space-occupying lesion or intracranial hemorrhage. There is no evidence of a cortical-based area of acute infarction. There is generalized cerebral atrophy. There is periventricular white matter low attenuation likely secondary to microangiopathy.  The ventricles and sulci are  appropriate for the patient's age. The basal cisterns are patent.  Visualized portions of the orbits are unremarkable. The visualized portions of the paranasal sinuses and mastoid air cells are unremarkable. Cerebrovascular atherosclerotic calcifications are noted.  The osseous structures are unremarkable.  IMPRESSION: 1. No acute intracranial pathology. 2. Chronic microvascular disease and cerebral atrophy.   Electronically Signed   By: Elige Ko   On: 01/13/2015 13:08   [name]  01/14/2015   CLINICAL DATA:  74 year old female with acute onset lower extremity weakness, found on floor this morning. Initial encounter.  EXAM: MRI HEAD WITHOUT CONTRAST  TECHNIQUE: Multiplanar, multiecho pulse sequences of the brain and surrounding structures were obtained without intravenous contrast.  COMPARISON:  Head CT without contrast 01/13/2015. Brain MRI 11/16/2014, and earlier  FINDINGS: No restricted diffusion or evidence of acute infarction. Major intracranial vascular flow voids are stable.  Advanced and widespread chronic small vessel ischemic changes re- demonstrated, including lacunar infarcts in the bilateral deep gray matter nuclei, left paracentral pons, both cerebellar hemispheres. Confluent bilateral cerebral white matter T2 and FLAIR hyperintensity. Wallace Cullens and white  matter signal is stable since June. Scattered hemosiderin deposition re- demonstrated.  Trace mastoid fluid is new. Negative nasopharynx. Increased paranasal sinus mucosal thickening. Orbit and scalp soft tissues are stable. Normal bone marrow signal. Stable visualized cervical spine.  IMPRESSION: 1.  No acute intracranial abnormality. 2. 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   Electronically Signed   By: Odessa Fleming M.D.   On: 01/14/2015 15:59    ASSESSMENT/PLAN:  Physical deconditioning - for Home health PT, OT, SW, ST and CNA  Seizure - continue Keppra 500 mg 1 tab by mouth twice a day  History of CVA -  continue Aggrenox 25-200 mg 24 hour 1 capsule by mouth twice a day  Hyperlipidemia - continue Crestor 5 mg 1 tab by mouth daily  Diabetes mellitus, type II - hemoglobin A1c 5.7; diet-controlled  Hypertension - well controlled; continue hydralazine 50 mg 1 tab by mouth every 8 hours and lisinopril 20 mg 1 tab by mouth twice a day  Constipation - continue senna S 2 tabs by mouth daily at bedtime and MiraLAX 17 g by mouth daily     I have filled out patient's discharge paperwork and written prescriptions.  Patient will receive home health PT, OT, ST, SW and CNA.  Total discharge time: Less than 30 minutes  Discharge time involved coordination of the discharge process with Child psychotherapist, nursing staff and therapy department. Medical justification for home health services verified.    [facility], NP [employer] [phone]

## 2015-02-06 ENCOUNTER — Ambulatory Visit: Payer: Commercial Managed Care - HMO | Admitting: Podiatry

## 2015-02-06 DIAGNOSIS — I69322 Dysarthria following cerebral infarction: Secondary | ICD-10-CM | POA: Diagnosis not present

## 2015-02-06 DIAGNOSIS — G40909 Epilepsy, unspecified, not intractable, without status epilepticus: Secondary | ICD-10-CM | POA: Diagnosis not present

## 2015-02-06 DIAGNOSIS — I10 Essential (primary) hypertension: Secondary | ICD-10-CM | POA: Diagnosis not present

## 2015-02-06 DIAGNOSIS — E785 Hyperlipidemia, unspecified: Secondary | ICD-10-CM | POA: Diagnosis not present

## 2015-02-06 DIAGNOSIS — E119 Type 2 diabetes mellitus without complications: Secondary | ICD-10-CM | POA: Diagnosis not present

## 2015-02-06 DIAGNOSIS — I69351 Hemiplegia and hemiparesis following cerebral infarction affecting right dominant side: Secondary | ICD-10-CM | POA: Diagnosis not present

## 2015-02-07 DIAGNOSIS — E119 Type 2 diabetes mellitus without complications: Secondary | ICD-10-CM | POA: Diagnosis not present

## 2015-02-07 DIAGNOSIS — E785 Hyperlipidemia, unspecified: Secondary | ICD-10-CM | POA: Diagnosis not present

## 2015-02-07 DIAGNOSIS — I10 Essential (primary) hypertension: Secondary | ICD-10-CM | POA: Diagnosis not present

## 2015-02-07 DIAGNOSIS — G40909 Epilepsy, unspecified, not intractable, without status epilepticus: Secondary | ICD-10-CM | POA: Diagnosis not present

## 2015-02-07 DIAGNOSIS — I69322 Dysarthria following cerebral infarction: Secondary | ICD-10-CM | POA: Diagnosis not present

## 2015-02-07 DIAGNOSIS — I69351 Hemiplegia and hemiparesis following cerebral infarction affecting right dominant side: Secondary | ICD-10-CM | POA: Diagnosis not present

## 2015-02-10 DIAGNOSIS — E785 Hyperlipidemia, unspecified: Secondary | ICD-10-CM | POA: Diagnosis not present

## 2015-02-10 DIAGNOSIS — I10 Essential (primary) hypertension: Secondary | ICD-10-CM | POA: Diagnosis not present

## 2015-02-10 DIAGNOSIS — E119 Type 2 diabetes mellitus without complications: Secondary | ICD-10-CM | POA: Diagnosis not present

## 2015-02-10 DIAGNOSIS — I69351 Hemiplegia and hemiparesis following cerebral infarction affecting right dominant side: Secondary | ICD-10-CM | POA: Diagnosis not present

## 2015-02-10 DIAGNOSIS — G40909 Epilepsy, unspecified, not intractable, without status epilepticus: Secondary | ICD-10-CM | POA: Diagnosis not present

## 2015-02-10 DIAGNOSIS — I69322 Dysarthria following cerebral infarction: Secondary | ICD-10-CM | POA: Diagnosis not present

## 2015-02-11 DIAGNOSIS — I69351 Hemiplegia and hemiparesis following cerebral infarction affecting right dominant side: Secondary | ICD-10-CM | POA: Diagnosis not present

## 2015-02-11 DIAGNOSIS — E785 Hyperlipidemia, unspecified: Secondary | ICD-10-CM | POA: Diagnosis not present

## 2015-02-11 DIAGNOSIS — I10 Essential (primary) hypertension: Secondary | ICD-10-CM | POA: Diagnosis not present

## 2015-02-11 DIAGNOSIS — I69322 Dysarthria following cerebral infarction: Secondary | ICD-10-CM | POA: Diagnosis not present

## 2015-02-11 DIAGNOSIS — G40909 Epilepsy, unspecified, not intractable, without status epilepticus: Secondary | ICD-10-CM | POA: Diagnosis not present

## 2015-02-11 DIAGNOSIS — E119 Type 2 diabetes mellitus without complications: Secondary | ICD-10-CM | POA: Diagnosis not present

## 2015-02-12 DIAGNOSIS — E785 Hyperlipidemia, unspecified: Secondary | ICD-10-CM | POA: Diagnosis not present

## 2015-02-12 DIAGNOSIS — I10 Essential (primary) hypertension: Secondary | ICD-10-CM | POA: Diagnosis not present

## 2015-02-12 DIAGNOSIS — I69322 Dysarthria following cerebral infarction: Secondary | ICD-10-CM | POA: Diagnosis not present

## 2015-02-12 DIAGNOSIS — I69351 Hemiplegia and hemiparesis following cerebral infarction affecting right dominant side: Secondary | ICD-10-CM | POA: Diagnosis not present

## 2015-02-12 DIAGNOSIS — G40909 Epilepsy, unspecified, not intractable, without status epilepticus: Secondary | ICD-10-CM | POA: Diagnosis not present

## 2015-02-12 DIAGNOSIS — E119 Type 2 diabetes mellitus without complications: Secondary | ICD-10-CM | POA: Diagnosis not present

## 2015-02-13 DIAGNOSIS — G40909 Epilepsy, unspecified, not intractable, without status epilepticus: Secondary | ICD-10-CM | POA: Diagnosis not present

## 2015-02-13 DIAGNOSIS — E785 Hyperlipidemia, unspecified: Secondary | ICD-10-CM | POA: Diagnosis not present

## 2015-02-13 DIAGNOSIS — E119 Type 2 diabetes mellitus without complications: Secondary | ICD-10-CM | POA: Diagnosis not present

## 2015-02-13 DIAGNOSIS — I69351 Hemiplegia and hemiparesis following cerebral infarction affecting right dominant side: Secondary | ICD-10-CM | POA: Diagnosis not present

## 2015-02-13 DIAGNOSIS — I69322 Dysarthria following cerebral infarction: Secondary | ICD-10-CM | POA: Diagnosis not present

## 2015-02-13 DIAGNOSIS — I10 Essential (primary) hypertension: Secondary | ICD-10-CM | POA: Diagnosis not present

## 2015-02-14 DIAGNOSIS — I10 Essential (primary) hypertension: Secondary | ICD-10-CM | POA: Diagnosis not present

## 2015-02-14 DIAGNOSIS — E785 Hyperlipidemia, unspecified: Secondary | ICD-10-CM | POA: Diagnosis not present

## 2015-02-14 DIAGNOSIS — I69322 Dysarthria following cerebral infarction: Secondary | ICD-10-CM | POA: Diagnosis not present

## 2015-02-14 DIAGNOSIS — I69351 Hemiplegia and hemiparesis following cerebral infarction affecting right dominant side: Secondary | ICD-10-CM | POA: Diagnosis not present

## 2015-02-14 DIAGNOSIS — G40909 Epilepsy, unspecified, not intractable, without status epilepticus: Secondary | ICD-10-CM | POA: Diagnosis not present

## 2015-02-14 DIAGNOSIS — E119 Type 2 diabetes mellitus without complications: Secondary | ICD-10-CM | POA: Diagnosis not present

## 2015-02-17 DIAGNOSIS — G40909 Epilepsy, unspecified, not intractable, without status epilepticus: Secondary | ICD-10-CM | POA: Diagnosis not present

## 2015-02-17 DIAGNOSIS — I10 Essential (primary) hypertension: Secondary | ICD-10-CM | POA: Diagnosis not present

## 2015-02-17 DIAGNOSIS — I69351 Hemiplegia and hemiparesis following cerebral infarction affecting right dominant side: Secondary | ICD-10-CM | POA: Diagnosis not present

## 2015-02-17 DIAGNOSIS — E119 Type 2 diabetes mellitus without complications: Secondary | ICD-10-CM | POA: Diagnosis not present

## 2015-02-17 DIAGNOSIS — E785 Hyperlipidemia, unspecified: Secondary | ICD-10-CM | POA: Diagnosis not present

## 2015-02-17 DIAGNOSIS — I69322 Dysarthria following cerebral infarction: Secondary | ICD-10-CM | POA: Diagnosis not present

## 2015-02-18 DIAGNOSIS — I10 Essential (primary) hypertension: Secondary | ICD-10-CM | POA: Diagnosis not present

## 2015-02-18 DIAGNOSIS — G40909 Epilepsy, unspecified, not intractable, without status epilepticus: Secondary | ICD-10-CM | POA: Diagnosis not present

## 2015-02-18 DIAGNOSIS — I69351 Hemiplegia and hemiparesis following cerebral infarction affecting right dominant side: Secondary | ICD-10-CM | POA: Diagnosis not present

## 2015-02-18 DIAGNOSIS — E785 Hyperlipidemia, unspecified: Secondary | ICD-10-CM | POA: Diagnosis not present

## 2015-02-18 DIAGNOSIS — E119 Type 2 diabetes mellitus without complications: Secondary | ICD-10-CM | POA: Diagnosis not present

## 2015-02-18 DIAGNOSIS — I69322 Dysarthria following cerebral infarction: Secondary | ICD-10-CM | POA: Diagnosis not present

## 2015-02-19 DIAGNOSIS — E119 Type 2 diabetes mellitus without complications: Secondary | ICD-10-CM | POA: Diagnosis not present

## 2015-02-19 DIAGNOSIS — I10 Essential (primary) hypertension: Secondary | ICD-10-CM | POA: Diagnosis not present

## 2015-02-19 DIAGNOSIS — I69351 Hemiplegia and hemiparesis following cerebral infarction affecting right dominant side: Secondary | ICD-10-CM | POA: Diagnosis not present

## 2015-02-19 DIAGNOSIS — G40909 Epilepsy, unspecified, not intractable, without status epilepticus: Secondary | ICD-10-CM | POA: Diagnosis not present

## 2015-02-19 DIAGNOSIS — E785 Hyperlipidemia, unspecified: Secondary | ICD-10-CM | POA: Diagnosis not present

## 2015-02-19 DIAGNOSIS — I69322 Dysarthria following cerebral infarction: Secondary | ICD-10-CM | POA: Diagnosis not present

## 2015-02-20 DIAGNOSIS — E785 Hyperlipidemia, unspecified: Secondary | ICD-10-CM | POA: Diagnosis not present

## 2015-02-20 DIAGNOSIS — I69351 Hemiplegia and hemiparesis following cerebral infarction affecting right dominant side: Secondary | ICD-10-CM | POA: Diagnosis not present

## 2015-02-20 DIAGNOSIS — G40909 Epilepsy, unspecified, not intractable, without status epilepticus: Secondary | ICD-10-CM | POA: Diagnosis not present

## 2015-02-20 DIAGNOSIS — I10 Essential (primary) hypertension: Secondary | ICD-10-CM | POA: Diagnosis not present

## 2015-02-20 DIAGNOSIS — I69322 Dysarthria following cerebral infarction: Secondary | ICD-10-CM | POA: Diagnosis not present

## 2015-02-20 DIAGNOSIS — E119 Type 2 diabetes mellitus without complications: Secondary | ICD-10-CM | POA: Diagnosis not present

## 2015-02-21 DIAGNOSIS — I10 Essential (primary) hypertension: Secondary | ICD-10-CM | POA: Diagnosis not present

## 2015-02-21 DIAGNOSIS — I69322 Dysarthria following cerebral infarction: Secondary | ICD-10-CM | POA: Diagnosis not present

## 2015-02-21 DIAGNOSIS — E785 Hyperlipidemia, unspecified: Secondary | ICD-10-CM | POA: Diagnosis not present

## 2015-02-21 DIAGNOSIS — G40909 Epilepsy, unspecified, not intractable, without status epilepticus: Secondary | ICD-10-CM | POA: Diagnosis not present

## 2015-02-21 DIAGNOSIS — I69351 Hemiplegia and hemiparesis following cerebral infarction affecting right dominant side: Secondary | ICD-10-CM | POA: Diagnosis not present

## 2015-02-21 DIAGNOSIS — E119 Type 2 diabetes mellitus without complications: Secondary | ICD-10-CM | POA: Diagnosis not present

## 2015-02-25 DIAGNOSIS — I69351 Hemiplegia and hemiparesis following cerebral infarction affecting right dominant side: Secondary | ICD-10-CM | POA: Diagnosis not present

## 2015-02-25 DIAGNOSIS — I69322 Dysarthria following cerebral infarction: Secondary | ICD-10-CM | POA: Diagnosis not present

## 2015-02-25 DIAGNOSIS — I10 Essential (primary) hypertension: Secondary | ICD-10-CM | POA: Diagnosis not present

## 2015-02-25 DIAGNOSIS — E119 Type 2 diabetes mellitus without complications: Secondary | ICD-10-CM | POA: Diagnosis not present

## 2015-02-25 DIAGNOSIS — G40909 Epilepsy, unspecified, not intractable, without status epilepticus: Secondary | ICD-10-CM | POA: Diagnosis not present

## 2015-02-25 DIAGNOSIS — E785 Hyperlipidemia, unspecified: Secondary | ICD-10-CM | POA: Diagnosis not present

## 2015-02-26 DIAGNOSIS — I10 Essential (primary) hypertension: Secondary | ICD-10-CM | POA: Diagnosis not present

## 2015-02-26 DIAGNOSIS — G40909 Epilepsy, unspecified, not intractable, without status epilepticus: Secondary | ICD-10-CM | POA: Diagnosis not present

## 2015-02-26 DIAGNOSIS — I69322 Dysarthria following cerebral infarction: Secondary | ICD-10-CM | POA: Diagnosis not present

## 2015-02-26 DIAGNOSIS — E785 Hyperlipidemia, unspecified: Secondary | ICD-10-CM | POA: Diagnosis not present

## 2015-02-26 DIAGNOSIS — E119 Type 2 diabetes mellitus without complications: Secondary | ICD-10-CM | POA: Diagnosis not present

## 2015-02-26 DIAGNOSIS — I69351 Hemiplegia and hemiparesis following cerebral infarction affecting right dominant side: Secondary | ICD-10-CM | POA: Diagnosis not present

## 2015-02-27 DIAGNOSIS — G8191 Hemiplegia, unspecified affecting right dominant side: Secondary | ICD-10-CM | POA: Diagnosis not present

## 2015-02-27 DIAGNOSIS — Z23 Encounter for immunization: Secondary | ICD-10-CM | POA: Diagnosis not present

## 2015-02-27 DIAGNOSIS — I10 Essential (primary) hypertension: Secondary | ICD-10-CM | POA: Diagnosis not present

## 2015-02-27 DIAGNOSIS — E119 Type 2 diabetes mellitus without complications: Secondary | ICD-10-CM | POA: Diagnosis not present

## 2015-02-27 DIAGNOSIS — R569 Unspecified convulsions: Secondary | ICD-10-CM | POA: Diagnosis not present

## 2015-02-27 DIAGNOSIS — G40909 Epilepsy, unspecified, not intractable, without status epilepticus: Secondary | ICD-10-CM | POA: Diagnosis not present

## 2015-02-27 DIAGNOSIS — E785 Hyperlipidemia, unspecified: Secondary | ICD-10-CM | POA: Diagnosis not present

## 2015-02-27 DIAGNOSIS — I69322 Dysarthria following cerebral infarction: Secondary | ICD-10-CM | POA: Diagnosis not present

## 2015-02-27 DIAGNOSIS — I639 Cerebral infarction, unspecified: Secondary | ICD-10-CM | POA: Diagnosis not present

## 2015-02-27 DIAGNOSIS — I69351 Hemiplegia and hemiparesis following cerebral infarction affecting right dominant side: Secondary | ICD-10-CM | POA: Diagnosis not present

## 2015-02-27 DIAGNOSIS — Z1389 Encounter for screening for other disorder: Secondary | ICD-10-CM | POA: Diagnosis not present

## 2015-02-28 DIAGNOSIS — I10 Essential (primary) hypertension: Secondary | ICD-10-CM | POA: Diagnosis not present

## 2015-02-28 DIAGNOSIS — E119 Type 2 diabetes mellitus without complications: Secondary | ICD-10-CM | POA: Diagnosis not present

## 2015-02-28 DIAGNOSIS — I69351 Hemiplegia and hemiparesis following cerebral infarction affecting right dominant side: Secondary | ICD-10-CM | POA: Diagnosis not present

## 2015-02-28 DIAGNOSIS — I69322 Dysarthria following cerebral infarction: Secondary | ICD-10-CM | POA: Diagnosis not present

## 2015-02-28 DIAGNOSIS — G40909 Epilepsy, unspecified, not intractable, without status epilepticus: Secondary | ICD-10-CM | POA: Diagnosis not present

## 2015-02-28 DIAGNOSIS — E785 Hyperlipidemia, unspecified: Secondary | ICD-10-CM | POA: Diagnosis not present

## 2015-03-04 ENCOUNTER — Other Ambulatory Visit: Payer: Self-pay | Admitting: Adult Health

## 2015-03-04 DIAGNOSIS — G40909 Epilepsy, unspecified, not intractable, without status epilepticus: Secondary | ICD-10-CM | POA: Diagnosis not present

## 2015-03-04 DIAGNOSIS — I69322 Dysarthria following cerebral infarction: Secondary | ICD-10-CM | POA: Diagnosis not present

## 2015-03-04 DIAGNOSIS — I10 Essential (primary) hypertension: Secondary | ICD-10-CM | POA: Diagnosis not present

## 2015-03-04 DIAGNOSIS — I69351 Hemiplegia and hemiparesis following cerebral infarction affecting right dominant side: Secondary | ICD-10-CM | POA: Diagnosis not present

## 2015-03-04 DIAGNOSIS — E119 Type 2 diabetes mellitus without complications: Secondary | ICD-10-CM | POA: Diagnosis not present

## 2015-03-04 DIAGNOSIS — E785 Hyperlipidemia, unspecified: Secondary | ICD-10-CM | POA: Diagnosis not present

## 2015-03-06 DIAGNOSIS — E785 Hyperlipidemia, unspecified: Secondary | ICD-10-CM | POA: Diagnosis not present

## 2015-03-06 DIAGNOSIS — I69351 Hemiplegia and hemiparesis following cerebral infarction affecting right dominant side: Secondary | ICD-10-CM | POA: Diagnosis not present

## 2015-03-06 DIAGNOSIS — I10 Essential (primary) hypertension: Secondary | ICD-10-CM | POA: Diagnosis not present

## 2015-03-06 DIAGNOSIS — G40909 Epilepsy, unspecified, not intractable, without status epilepticus: Secondary | ICD-10-CM | POA: Diagnosis not present

## 2015-03-06 DIAGNOSIS — E119 Type 2 diabetes mellitus without complications: Secondary | ICD-10-CM | POA: Diagnosis not present

## 2015-03-06 DIAGNOSIS — I69322 Dysarthria following cerebral infarction: Secondary | ICD-10-CM | POA: Diagnosis not present

## 2015-03-07 DIAGNOSIS — I10 Essential (primary) hypertension: Secondary | ICD-10-CM | POA: Diagnosis not present

## 2015-03-07 DIAGNOSIS — G40909 Epilepsy, unspecified, not intractable, without status epilepticus: Secondary | ICD-10-CM | POA: Diagnosis not present

## 2015-03-07 DIAGNOSIS — I69351 Hemiplegia and hemiparesis following cerebral infarction affecting right dominant side: Secondary | ICD-10-CM | POA: Diagnosis not present

## 2015-03-07 DIAGNOSIS — E119 Type 2 diabetes mellitus without complications: Secondary | ICD-10-CM | POA: Diagnosis not present

## 2015-03-07 DIAGNOSIS — E785 Hyperlipidemia, unspecified: Secondary | ICD-10-CM | POA: Diagnosis not present

## 2015-03-07 DIAGNOSIS — I69322 Dysarthria following cerebral infarction: Secondary | ICD-10-CM | POA: Diagnosis not present

## 2015-03-11 DIAGNOSIS — G40909 Epilepsy, unspecified, not intractable, without status epilepticus: Secondary | ICD-10-CM | POA: Diagnosis not present

## 2015-03-11 DIAGNOSIS — I69351 Hemiplegia and hemiparesis following cerebral infarction affecting right dominant side: Secondary | ICD-10-CM | POA: Diagnosis not present

## 2015-03-11 DIAGNOSIS — I69322 Dysarthria following cerebral infarction: Secondary | ICD-10-CM | POA: Diagnosis not present

## 2015-03-11 DIAGNOSIS — E119 Type 2 diabetes mellitus without complications: Secondary | ICD-10-CM | POA: Diagnosis not present

## 2015-03-11 DIAGNOSIS — I10 Essential (primary) hypertension: Secondary | ICD-10-CM | POA: Diagnosis not present

## 2015-03-11 DIAGNOSIS — E785 Hyperlipidemia, unspecified: Secondary | ICD-10-CM | POA: Diagnosis not present

## 2015-03-13 DIAGNOSIS — I69322 Dysarthria following cerebral infarction: Secondary | ICD-10-CM | POA: Diagnosis not present

## 2015-03-13 DIAGNOSIS — I69351 Hemiplegia and hemiparesis following cerebral infarction affecting right dominant side: Secondary | ICD-10-CM | POA: Diagnosis not present

## 2015-03-13 DIAGNOSIS — I10 Essential (primary) hypertension: Secondary | ICD-10-CM | POA: Diagnosis not present

## 2015-03-13 DIAGNOSIS — G40909 Epilepsy, unspecified, not intractable, without status epilepticus: Secondary | ICD-10-CM | POA: Diagnosis not present

## 2015-03-13 DIAGNOSIS — E119 Type 2 diabetes mellitus without complications: Secondary | ICD-10-CM | POA: Diagnosis not present

## 2015-03-13 DIAGNOSIS — E785 Hyperlipidemia, unspecified: Secondary | ICD-10-CM | POA: Diagnosis not present

## 2015-03-18 DIAGNOSIS — E119 Type 2 diabetes mellitus without complications: Secondary | ICD-10-CM | POA: Diagnosis not present

## 2015-03-18 DIAGNOSIS — E785 Hyperlipidemia, unspecified: Secondary | ICD-10-CM | POA: Diagnosis not present

## 2015-03-18 DIAGNOSIS — I69351 Hemiplegia and hemiparesis following cerebral infarction affecting right dominant side: Secondary | ICD-10-CM | POA: Diagnosis not present

## 2015-03-18 DIAGNOSIS — I69322 Dysarthria following cerebral infarction: Secondary | ICD-10-CM | POA: Diagnosis not present

## 2015-03-18 DIAGNOSIS — G40909 Epilepsy, unspecified, not intractable, without status epilepticus: Secondary | ICD-10-CM | POA: Diagnosis not present

## 2015-03-18 DIAGNOSIS — I10 Essential (primary) hypertension: Secondary | ICD-10-CM | POA: Diagnosis not present

## 2015-03-20 DIAGNOSIS — E785 Hyperlipidemia, unspecified: Secondary | ICD-10-CM | POA: Diagnosis not present

## 2015-03-20 DIAGNOSIS — I69351 Hemiplegia and hemiparesis following cerebral infarction affecting right dominant side: Secondary | ICD-10-CM | POA: Diagnosis not present

## 2015-03-20 DIAGNOSIS — G40909 Epilepsy, unspecified, not intractable, without status epilepticus: Secondary | ICD-10-CM | POA: Diagnosis not present

## 2015-03-20 DIAGNOSIS — E119 Type 2 diabetes mellitus without complications: Secondary | ICD-10-CM | POA: Diagnosis not present

## 2015-03-20 DIAGNOSIS — I10 Essential (primary) hypertension: Secondary | ICD-10-CM | POA: Diagnosis not present

## 2015-03-20 DIAGNOSIS — I69322 Dysarthria following cerebral infarction: Secondary | ICD-10-CM | POA: Diagnosis not present

## 2015-03-28 DIAGNOSIS — E119 Type 2 diabetes mellitus without complications: Secondary | ICD-10-CM | POA: Diagnosis not present

## 2015-03-28 DIAGNOSIS — I69322 Dysarthria following cerebral infarction: Secondary | ICD-10-CM | POA: Diagnosis not present

## 2015-03-28 DIAGNOSIS — I69351 Hemiplegia and hemiparesis following cerebral infarction affecting right dominant side: Secondary | ICD-10-CM | POA: Diagnosis not present

## 2015-03-28 DIAGNOSIS — G40909 Epilepsy, unspecified, not intractable, without status epilepticus: Secondary | ICD-10-CM | POA: Diagnosis not present

## 2015-03-28 DIAGNOSIS — E785 Hyperlipidemia, unspecified: Secondary | ICD-10-CM | POA: Diagnosis not present

## 2015-03-28 DIAGNOSIS — I10 Essential (primary) hypertension: Secondary | ICD-10-CM | POA: Diagnosis not present

## 2015-03-31 DIAGNOSIS — M6289 Other specified disorders of muscle: Secondary | ICD-10-CM | POA: Diagnosis not present

## 2015-03-31 DIAGNOSIS — R569 Unspecified convulsions: Secondary | ICD-10-CM | POA: Diagnosis not present

## 2015-03-31 DIAGNOSIS — E113299 Type 2 diabetes mellitus with mild nonproliferative diabetic retinopathy without macular edema, unspecified eye: Secondary | ICD-10-CM | POA: Diagnosis not present

## 2015-03-31 DIAGNOSIS — N182 Chronic kidney disease, stage 2 (mild): Secondary | ICD-10-CM | POA: Diagnosis not present

## 2015-03-31 DIAGNOSIS — H409 Unspecified glaucoma: Secondary | ICD-10-CM | POA: Diagnosis not present

## 2015-03-31 DIAGNOSIS — I639 Cerebral infarction, unspecified: Secondary | ICD-10-CM | POA: Diagnosis not present

## 2015-03-31 DIAGNOSIS — I10 Essential (primary) hypertension: Secondary | ICD-10-CM | POA: Diagnosis not present

## 2015-03-31 DIAGNOSIS — M1990 Unspecified osteoarthritis, unspecified site: Secondary | ICD-10-CM | POA: Diagnosis not present

## 2015-04-14 DIAGNOSIS — Z1211 Encounter for screening for malignant neoplasm of colon: Secondary | ICD-10-CM | POA: Diagnosis not present

## 2015-04-25 ENCOUNTER — Other Ambulatory Visit: Payer: Self-pay | Admitting: Adult Health

## 2015-06-01 ENCOUNTER — Other Ambulatory Visit: Payer: Self-pay | Admitting: Adult Health

## 2015-07-21 DIAGNOSIS — H25013 Cortical age-related cataract, bilateral: Secondary | ICD-10-CM | POA: Diagnosis not present

## 2015-07-21 DIAGNOSIS — H2513 Age-related nuclear cataract, bilateral: Secondary | ICD-10-CM | POA: Diagnosis not present

## 2015-07-21 DIAGNOSIS — E113291 Type 2 diabetes mellitus with mild nonproliferative diabetic retinopathy without macular edema, right eye: Secondary | ICD-10-CM | POA: Diagnosis not present

## 2015-07-21 DIAGNOSIS — H401114 Primary open-angle glaucoma, right eye, indeterminate stage: Secondary | ICD-10-CM | POA: Diagnosis not present

## 2015-07-28 DIAGNOSIS — E7800 Pure hypercholesterolemia, unspecified: Secondary | ICD-10-CM | POA: Diagnosis not present

## 2015-07-28 DIAGNOSIS — R569 Unspecified convulsions: Secondary | ICD-10-CM | POA: Diagnosis not present

## 2015-07-28 DIAGNOSIS — I639 Cerebral infarction, unspecified: Secondary | ICD-10-CM | POA: Diagnosis not present

## 2015-07-28 DIAGNOSIS — E113299 Type 2 diabetes mellitus with mild nonproliferative diabetic retinopathy without macular edema, unspecified eye: Secondary | ICD-10-CM | POA: Diagnosis not present

## 2015-07-28 DIAGNOSIS — R269 Unspecified abnormalities of gait and mobility: Secondary | ICD-10-CM | POA: Diagnosis not present

## 2015-07-28 DIAGNOSIS — N182 Chronic kidney disease, stage 2 (mild): Secondary | ICD-10-CM | POA: Diagnosis not present

## 2015-07-28 DIAGNOSIS — I10 Essential (primary) hypertension: Secondary | ICD-10-CM | POA: Diagnosis not present

## 2015-07-28 DIAGNOSIS — G8191 Hemiplegia, unspecified affecting right dominant side: Secondary | ICD-10-CM | POA: Diagnosis not present

## 2015-08-04 ENCOUNTER — Encounter: Payer: Self-pay | Admitting: Rehabilitation

## 2015-08-04 ENCOUNTER — Ambulatory Visit: Payer: Commercial Managed Care - HMO | Attending: Internal Medicine | Admitting: Rehabilitation

## 2015-08-04 DIAGNOSIS — R2991 Unspecified symptoms and signs involving the musculoskeletal system: Secondary | ICD-10-CM | POA: Diagnosis not present

## 2015-08-04 DIAGNOSIS — R531 Weakness: Secondary | ICD-10-CM | POA: Insufficient documentation

## 2015-08-04 DIAGNOSIS — R269 Unspecified abnormalities of gait and mobility: Secondary | ICD-10-CM | POA: Diagnosis not present

## 2015-08-04 DIAGNOSIS — R29898 Other symptoms and signs involving the musculoskeletal system: Secondary | ICD-10-CM | POA: Diagnosis not present

## 2015-08-04 DIAGNOSIS — M6281 Muscle weakness (generalized): Secondary | ICD-10-CM

## 2015-08-04 DIAGNOSIS — R2689 Other abnormalities of gait and mobility: Secondary | ICD-10-CM

## 2015-08-04 DIAGNOSIS — R2681 Unsteadiness on feet: Secondary | ICD-10-CM | POA: Insufficient documentation

## 2015-08-04 NOTE — Therapy (Addendum)
[facility] [address] [place], [place], [address] Phone: [phone]   Fax:  [phone]  Physical Therapy Evaluation  Patient Details  Name: [name] MRN: [id number] Date of Birth: April 07, 1941 Referring Provider: Georgann Housekeeper, MD  Encounter Date: 08/04/2015      PT End of Session - 08/04/15 1513    Visit Number 1   Number of Visits 17   Date for PT Re-Evaluation 10/03/15   Authorization Type [employer] H[employer]-G code every 10th visit   PT Start Time 1315   PT Stop Time 1400   PT Time Calculation (min) 45 min   Activity Tolerance Patient tolerated treatment well   Behavior During Therapy [facility] for tasks assessed/performed      Past Medical History  Diagnosis Date  . Stroke (HCC)   . Hypertension   . Hypercholesteremia   . Diabetes mellitus     Past Surgical History  Procedure Laterality Date  . Tooth extraction      There were no vitals filed for this visit.   Visit Diagnosis:   Other abnormalities of gait and mobility Unsteadiness on feet Muscle weakness (generalized)      Subjective Assessment - 08/04/15 1323    Subjective "To get some exercise."  "I had a stroke."   Limitations House hold activities;Walking;Standing   Patient Stated Goals "I want to walk better."    Currently in Pain? No/denies            [facility] PT Assessment - 08/04/15 0001    Assessment   Medical Diagnosis CVA w/ R hemiparesis   Referring Provider Georgann Housekeeper, MD   Onset Date/Surgical Date --  CVA approx 5 yrs ago, syncopal episode in Aug 2016   Hand Dominance Left  since the stroke   Precautions   Precautions Fall   Restrictions   Weight Bearing Restrictions No   Balance Screen   Has the patient fallen in the past 6 months No   Has the patient had a decrease in activity level because of a fear of falling?  Yes   Is the patient reluctant to leave their home because of a fear of falling?  Yes   Home Environment    Living Environment Private residence   Living Arrangements Non-relatives/Friends  [facility] aide   Available Help at Discharge Personal care attendant;Available 24 hours/day   Type of Home House   Home Access Stairs to enter   Entrance Stairs-Number of Steps 1  from drive way and 1 into house   Entrance Stairs-Rails None   Home Layout One level   Home Equipment Walker - 4 wheels;Shower seat;Other (comment);Grab bars - tub/shower  tub/shower-has assist, handicapped height, grab bars in bed   Prior Function   Level of Independence Needs assistance with ADLs;Needs assistance with homemaking  needs assist for bathing, she dresses   Vocation Retired   Copy Status Within Functional Limits for tasks assessed   Observation/Other Assessments   Focus on Therapeutic Outcomes (FOTO)  ABC 17.5%   caregiver filled out   Sensation   Light Touch Impaired Detail   Light Touch Impaired Details Impaired RLE;Impaired RUE  per report, slightly decreased sensation in RUE/LE   Proprioception Appears Intact   Coordination   Gross Motor Movements are Fluid and Coordinated Yes  LEs   Fine Motor Movements are Fluid and Coordinated Yes  LEs   Heel Shin Test grossly WFL, somewhat limited due to strength  Posture/Postural Control   Posture/Postural Control Postural limitations   Postural Limitations Posterior pelvic tilt  Pt tends to sit with trunk extended, sacral sitting   ROM / Strength   AROM / PROM / Strength Strength   Strength   Overall Strength Deficits   Overall Strength Comments L LE grossly 4/5, R hip flex 3+/5, R knee ext 4/5, R knee flex 3+/5, R ankle DF 4/5, PF 4/5   Transfers   Transfers Sit to Stand;Stand to Sit   Sit to Stand 5: Supervision   Sit to Stand Details Verbal cues for sequencing;Verbal cues for technique;Verbal cues for precautions/safety   Five time sit to stand comments  33.09 secs  use of BUEs, heavy leaning on chair   Stand to Sit 5: Supervision    Stand to Sit Details (indicate cue type and reason) Verbal cues for sequencing;Verbal cues for technique;Verbal cues for precautions/safety   Ambulation/Gait   Ambulation/Gait Yes   Ambulation/Gait Assistance 4: Min guard;5: Supervision   Ambulation/Gait Assistance Details min/guard when pt backing up to seating surface for safety as she tends to sit quickly and S for gait in straight path.  Cues for increased stride length, esp on RLE (step length)   Ambulation Distance (Feet) 100 Feet   Assistive device 4-wheeled walker   Gait Pattern Step-through pattern;Decreased arm swing - right;Decreased arm swing - left;Decreased stride length;Decreased dorsiflexion - right;Decreased dorsiflexion - left;Decreased weight shift to right;Trendelenburg;Shuffle;Trunk flexed;Narrow base of support;Poor foot clearance - right   Ambulation Surface Level;Indoor   Gait velocity .60 ft/sec   Standardized Balance Assessment   Standardized Balance Assessment Berg Balance Test   Berg Balance Test   Sit to Stand Able to stand using hands after several tries   Sitting with Back Unsupported but Feet Supported on Floor or Stool Able to sit safely and securely 2 minutes   Stand to Sit Uses backs of legs against chair to control descent   Transfers Needs one person to assist                           PT Education - 08/04/15 1512    Education provided Yes   Education Details Education on evaluation findings, POC, safety and ensuring 24/7 S at home.    Person(s) Educated Patient;Caregiver(s)   Methods Explanation   Comprehension Verbalized understanding          PT Short Term Goals - 08/04/15 1521    PT SHORT TERM GOAL #1   Title Pt will initiate HEP for strengthening and balance to indicate decreased fall risk and improved functional mobility.  (Target Date: 09/01/15)   PT SHORT TERM GOAL #2   Title Will assess BERG balance test and improve score by 4 points from baseline to indicate  decreased fall risk.    PT SHORT TERM GOAL #3   Title Pt will increase gait speed to 1.2 ft/sec in order to indicate decreased fall risk and improved efficiency of gait.     PT SHORT TERM GOAL #4   Title Pt will verbalize understanding of fall prevention strategies to decrease fall risk at home.    PT SHORT TERM GOAL #5   Title Pt will ambulate >200' over varying indoor surfaces with rollator at mod I level to indicate safety with AD/ambulation at home.             PT Long Term Goals - 08/04/15 1524    PT LONG TERM  GOAL #1   Title Pt will be independent (with caregiver) with HEP in order to indicate decreased fall risk and improved functional mobility.  (Target Date: 09/29/15)   PT LONG TERM GOAL #2   Title Pt will improve BERG balance test score 6 points from baseline in order to indicate decreased fall risk.     PT LONG TERM GOAL #3   Title Pt will perform dynamic standing balance with single UE support x 5 mins at mod I level in order to increase independence with ADLs and functional mobility.     PT LONG TERM GOAL #4   Title Pt will ambulate 300' over paved outdoor surfaces with rollator at S level in order to indicate safe community negotiation.                 Plan - August 11, 2015 1514    Clinical Impression Statement Pt presents with R hemiparesis (from CVA approx 5 years ago) and decreasing balance with increased fall risk.  Pt with history of HTN, DM, and syncopal episodes (two of which have left her hospitalized and following the second episode, she went to [facility] for therapy for approx 1 month before returning home with HHPT/OT/SLP).  Upon PT evaluation, note that she has gait speed of .60 ft/sec, indicative of recurrent fall risk and 5 time sit to stand time of 33.09 sec, also indicative of fall risk and decreased functional strength.  She relies heavily on UEs and backs of legs on seating surfaces for support and also has decreased sensation in RLE.  Pt presentation is  evolving due to nature of syncopal episodes and is of moderate complexity with PT POC.  Pt will benefit greatly from OP neuro PT in order to address deficits.  PT also recommends OT consult to increase independence with ADLs.     Pt will benefit from skilled therapeutic intervention in order to improve on the following deficits Abnormal gait;Decreased activity tolerance;Decreased balance;Decreased cognition;Decreased coordination;Decreased endurance;Decreased knowledge of precautions;Decreased knowledge of use of DME;Decreased mobility;Decreased safety awareness;Decreased strength;Impaired perceived functional ability;Impaired flexibility;Impaired sensation;Impaired UE functional use;Improper body mechanics;Postural dysfunction   Rehab Potential Good   Clinical Impairments Affecting Rehab Potential years post CVA, co-morbidities, pre-morbid habits   PT Frequency 2x / week   PT Duration 8 weeks   PT Treatment/Interventions ADLs/Self Care Home Management;Electrical Stimulation;DME Instruction;Gait training;Stair training;Functional mobility training;Therapeutic activities;Therapeutic exercise;Balance training;Neuromuscular re-education;Cognitive remediation;Patient/family education;Orthotic Fit/Training;Energy conservation   PT Next Visit Plan finish BERG and comment in goals section, est HEP for generalized strength (sit<>stand), safety with use of RW (esp when backing up)   Recommended Other Services PT to request order for OT, check to see if this is in yet.    Consulted and Agree with Plan of Care Patient;Family member/caregiver   Family Member Consulted caregiver          G-Codes - 11-Aug-2015 1535    Functional Assessment Tool Used 5 Time sit to stand: 33.09, gait speed .60 ft/sec   Functional Limitation Mobility: Walking and moving around   Mobility: Walking and Moving Around Current Status [phone]) At least 60 percent but less than 80 percent impaired, limited or restricted   Mobility: Walking  and Moving Around Goal Status [phone]) At least 40 percent but less than 60 percent impaired, limited or restricted       Problem List Patient Active Problem List   Diagnosis Date Noted  . Rhabdomyolysis 01/13/2015  . HLD (hyperlipidemia) 01/13/2015  . Diabetes type 2, controlled (HCC)  01/13/2015  . Faintness   . CVA (cerebral infarction) 10/25/2011  . UTI (lower urinary tract infection) 10/25/2011  . Dehydration 10/25/2011  . Syncope 10/23/2011  . H/O: CVA (cerebrovascular accident) 10/23/2011  . Hypertension 10/23/2011  . Diabetes mellitus (HCC) 10/23/2011    Harriet Butte, PT, MPT [facility] [address] [place], [place], [address] Phone: [phone]   Fax:  [phone] 08/04/2015, 3:37 PM  Name: [name] MRN: [id number] Date of Birth: 06-04-1940

## 2015-08-06 ENCOUNTER — Ambulatory Visit: Payer: Commercial Managed Care - HMO | Admitting: Physical Therapy

## 2015-08-06 VITALS — BP 166/57 | HR 75

## 2015-08-06 DIAGNOSIS — R531 Weakness: Secondary | ICD-10-CM | POA: Diagnosis not present

## 2015-08-06 DIAGNOSIS — R29898 Other symptoms and signs involving the musculoskeletal system: Secondary | ICD-10-CM | POA: Diagnosis not present

## 2015-08-06 DIAGNOSIS — R269 Unspecified abnormalities of gait and mobility: Secondary | ICD-10-CM

## 2015-08-06 DIAGNOSIS — R2991 Unspecified symptoms and signs involving the musculoskeletal system: Secondary | ICD-10-CM | POA: Diagnosis not present

## 2015-08-06 DIAGNOSIS — R2681 Unsteadiness on feet: Secondary | ICD-10-CM | POA: Diagnosis not present

## 2015-08-06 NOTE — Patient Instructions (Signed)
Functional Quadriceps: Sit to Stand    Sit on edge of chair, feet flat on floor with walker in front of you. Use both arms on chair to push up to standing.  Don't push your legs back against the chair.  Stand upright, extending knees fully while holding onto walker.  Reach hands back onto arms of chair and slowly lower self down to have a seat back in the chair. Repeat 10 times per set. Do 3 sessions per day.  http://orth.exer.us/735   Copyright  VHI. All rights reserved.   Quadriceps - Seated    Sit straight in a chair. Extended knee fully. Repeat for 10 times.  Do on other leg.   Do 3 times per day.  Copyright  VHI. All rights reserved.

## 2015-08-07 ENCOUNTER — Telehealth: Payer: Self-pay | Admitting: Rehabilitation

## 2015-08-07 ENCOUNTER — Ambulatory Visit: Payer: Commercial Managed Care - HMO | Admitting: Physical Therapy

## 2015-08-07 NOTE — Therapy (Signed)
[facility] [address] [place], [place], [address] Phone: [phone]   Fax:  [phone]  Physical Therapy Treatment  Patient Details  Name: [name] MRN: [id number] Date of Birth: 1940/10/08 Referring Provider: Georgann Housekeeper, MD  Encounter Date: 08/06/2015      PT End of Session - 08/07/15 1628    Visit Number 2   Number of Visits 17   Date for PT Re-Evaluation 10/03/15   Authorization Type [employer] H[employer]-G code every 10th visit   PT Start Time 1318   PT Stop Time 1401   PT Time Calculation (min) 43 min   Equipment Utilized During Treatment Gait belt   Activity Tolerance Patient tolerated treatment well   Behavior During Therapy [facility] for tasks assessed/performed      Past Medical History  Diagnosis Date  . Stroke (HCC)   . Hypertension   . Hypercholesteremia   . Diabetes mellitus     Past Surgical History  Procedure Laterality Date  . Tooth extraction      Filed Vitals:   08/06/15 1300 08/06/15 1345  BP: 152/61 166/57  Pulse: 72 75    Visit Diagnosis:  Abnormality of gait      Subjective Assessment - 08/06/15 1324    Subjective Denies falls or changes.       See HEP for additional treatment.  Pt performed HEP as written during treatment.        [facility] Adult PT Treatment/Exercise - 08/06/15 1325    Transfers   Transfers Sit to Stand;Stand to Sit   Sit to Stand 4: Min guard;4: Min assist   Sit to Stand Details Tactile cues for posture;Tactile cues for placement;Verbal cues for sequencing;Verbal cues for technique;Manual facilitation for weight shifting  pt tends to push back against chair and needs assist   Stand to Sit 4: Min guard;4: Min assist   Stand to Sit Details (indicate cue type and reason) Tactile cues for placement;Manual facilitation for placement;Other (comment)  Assist to prevent chair from tipping with posterior lean   Ambulation/Gait   Ambulation/Gait Yes    Ambulation/Gait Assistance 4: Min guard   Ambulation/Gait Assistance Details cues to increase step length on R to prevent step to gait.  Able to correct with frequent cues.  Tends to drag RLE and keep knees fully extended   Ambulation Distance (Feet) 240 Feet  110' twice   Assistive device 4-wheeled walker   Gait Pattern Decreased arm swing - right;Decreased arm swing - left;Decreased stride length;Decreased dorsiflexion - right;Decreased dorsiflexion - left;Decreased weight shift to right;Trendelenburg;Shuffle;Trunk flexed;Narrow base of support;Poor foot clearance - right;Step-to pattern;Decreased hip/knee flexion - right;Decreased hip/knee flexion - left   Ambulation Surface Level;Indoor   Standardized Balance Assessment   Standardized Balance Assessment Berg Balance Test   Berg Balance Test   Sit to Stand Needs minimal aid to stand or to stabilize   Standing Unsupported Unable to stand 30 seconds unassisted   Sitting with Back Unsupported but Feet Supported on Floor or Stool Able to sit safely and securely 2 minutes   Stand to Sit Needs assistance to sit   Transfers Needs one person to assist   Standing Unsupported with Eyes Closed Needs help to keep from falling   Standing Ubsupported with Feet Together Needs help to attain position and unable to hold for 15 seconds   From Standing, Reach Forward with Outstretched Arm Loses balance while trying/requires external support   From Standing Position, Pick up Object from  Floor Unable to try/needs assist to keep balance   From Standing Position, Turn to Look Behind Over each Shoulder Needs assist to keep from losing balance and falling   Turn 360 Degrees Needs assistance while turning   Standing Unsupported, Alternately Place Feet on Step/Stool Needs assistance to keep from falling or unable to try   Standing Unsupported, One Foot in Front Loses balance while stepping or standing   Standing on One Leg Unable to try or needs assist to prevent  fall   Total Score 6                PT Education - 08/07/15 1627    Education provided Yes   Education Details HEP   Person(s) Educated Patient   Methods Explanation;Demonstration;Handout   Comprehension Verbalized understanding;Need further instruction          PT Short Term Goals - 08/07/15 1634    PT SHORT TERM GOAL #1   Title Pt will initiate HEP for strengthening and balance to indicate decreased fall risk and improved functional mobility.  (Target Date: 09/01/15)   PT SHORT TERM GOAL #2   Title Will assess BERG balance test and improve score by 4 points from baseline to indicate decreased fall risk.    Baseline Pt scored 6/56 on BERG on 08/06/15   PT SHORT TERM GOAL #3   Title Pt will increase gait speed to 1.2 ft/sec in order to indicate decreased fall risk and improved efficiency of gait.     PT SHORT TERM GOAL #4   Title Pt will verbalize understanding of fall prevention strategies to decrease fall risk at home.    PT SHORT TERM GOAL #5   Title Pt will ambulate >200' over varying indoor surfaces with rollator at mod I level to indicate safety with AD/ambulation at home.             PT Long Term Goals - 08/04/15 1524    PT LONG TERM GOAL #1   Title Pt will be independent (with caregiver) with HEP in order to indicate decreased fall risk and improved functional mobility.  (Target Date: 09/29/15)   PT LONG TERM GOAL #2   Title Pt will improve BERG balance test score 6 points from baseline in order to indicate decreased fall risk.     PT LONG TERM GOAL #3   Title Pt will perform dynamic standing balance with single UE support x 5 mins at mod I level in order to increase independence with ADLs and functional mobility.     PT LONG TERM GOAL #4   Title Pt will ambulate 300' over paved outdoor surfaces with rollator at S level in order to indicate safe community negotiation.                 Plan - 08/07/15 1629    Clinical Impression Statement Pt with  difficulty staying alert for treatment today at times.  Initiated HEP and performed BERG.  Pt with difficulty standing without UE support.  Cues for safety with RW.  Continue PT per POC.   Pt will benefit from skilled therapeutic intervention in order to improve on the following deficits Abnormal gait;Decreased activity tolerance;Decreased balance;Decreased cognition;Decreased coordination;Decreased endurance;Decreased knowledge of precautions;Decreased knowledge of use of DME;Decreased mobility;Decreased safety awareness;Decreased strength;Impaired perceived functional ability;Impaired flexibility;Impaired sensation;Impaired UE functional use;Improper body mechanics;Postural dysfunction   Rehab Potential Good   Clinical Impairments Affecting Rehab Potential years post CVA, co-morbidities, pre-morbid habits   PT Frequency 2x /  week   PT Duration 8 weeks   PT Treatment/Interventions ADLs/Self Care Home Management;Electrical Stimulation;DME Instruction;Gait training;Stair training;Functional mobility training;Therapeutic activities;Therapeutic exercise;Balance training;Neuromuscular re-education;Cognitive remediation;Patient/family education;Orthotic Fit/Training;Energy conservation   PT Next Visit Plan Review HEP and add further strengthening exercises.   Consulted and Agree with Plan of Care Patient        Problem List Patient Active Problem List   Diagnosis Date Noted  . Rhabdomyolysis 01/13/2015  . HLD (hyperlipidemia) 01/13/2015  . Diabetes type 2, controlled (HCC) 01/13/2015  . Faintness   . CVA (cerebral infarction) 10/25/2011  . UTI (lower urinary tract infection) 10/25/2011  . Dehydration 10/25/2011  . Syncope 10/23/2011  . H/O: CVA (cerebrovascular accident) 10/23/2011  . Hypertension 10/23/2011  . Diabetes mellitus (HCC) 10/23/2011    Newell Coral 08/07/2015, 4:35 PM   [facility] [address] [place], [place], [address] Phone: [phone]   Fax:  [phone]  Name: GERALDINA PARROTT MRN: [id number] Date of Birth: 1941-03-22    Maxcine Ham [facility] 08/07/2015 4:35 PM Phone: [phone] Fax: [phone]

## 2015-08-07 NOTE — Telephone Encounter (Signed)
Dr. Donette Larry,   I evaluated Mrs. [name] on Monday for OP neuro PT.  Note that she would benefit from skilled OP OT as well due to decreased strength in RUE as well as decreased ability to complete ADLs.  Please order OT for pt if you feel appropriate.    Thanks,  Harriet Butte, PT, MPT [facility] [address] [place], [place], [address] Phone: [phone]   Fax:  [phone] 08/07/2015, 8:16 AM

## 2015-08-11 ENCOUNTER — Ambulatory Visit: Payer: Commercial Managed Care - HMO | Admitting: Physical Therapy

## 2015-08-13 ENCOUNTER — Ambulatory Visit: Payer: Commercial Managed Care - HMO | Admitting: Physical Therapy

## 2015-08-13 VITALS — BP 176/64 | HR 64

## 2015-08-13 DIAGNOSIS — R2681 Unsteadiness on feet: Secondary | ICD-10-CM

## 2015-08-13 DIAGNOSIS — R269 Unspecified abnormalities of gait and mobility: Secondary | ICD-10-CM | POA: Diagnosis not present

## 2015-08-13 DIAGNOSIS — R531 Weakness: Secondary | ICD-10-CM | POA: Diagnosis not present

## 2015-08-13 DIAGNOSIS — R29898 Other symptoms and signs involving the musculoskeletal system: Secondary | ICD-10-CM | POA: Diagnosis not present

## 2015-08-13 DIAGNOSIS — R2991 Unspecified symptoms and signs involving the musculoskeletal system: Secondary | ICD-10-CM | POA: Diagnosis not present

## 2015-08-13 NOTE — Therapy (Signed)
[facility] [address] [place], [place], [address] Phone: [phone]   Fax:  [phone]  Physical Therapy Treatment  Patient Details  Name: [name] MRN: [id number] Date of Birth: 06-01-1940 Referring Provider: Georgann Housekeeper, MD  Encounter Date: 08/13/2015      PT End of Session - 08/13/15 2010    Visit Number 3   Number of Visits 17   Date for PT Re-Evaluation 10/03/15   Authorization Type [employer]-G code every 10th visit   PT Start Time 1109  Pt arrived late to session   PT Stop Time 1152   PT Time Calculation (min) 43 min   Equipment Utilized During Treatment Gait belt   Activity Tolerance Patient tolerated treatment well   Behavior During Therapy --  decreased arousal      Past Medical History  Diagnosis Date  . Stroke (HCC)   . Hypertension   . Hypercholesteremia   . Diabetes mellitus     Past Surgical History  Procedure Laterality Date  . Tooth extraction      Filed Vitals:   08/13/15 1145  BP: 176/64  Pulse: 64    Visit Diagnosis:  Abnormality of gait  Unsteadiness  Right leg weakness  Generalized weakness      Subjective Assessment - 08/13/15 1114    Subjective Pt reports no falls and no signfiicant changes.   Limitations House hold activities;Walking;Standing   Patient Stated Goals "I want to walk better."    Currently in Pain? No/denies                         [facility] Adult PT Treatment/Exercise - 08/13/15 0001    Transfers   Transfers Sit to Stand   Sit to Stand 4: Min guard;5: Supervision   Sit to Stand Details (indicate cue type and reason) Cueing for setup, technique, hand placement with carryover of ~50% of cueing provided.   Stand to Sit 4: Min guard   Transfer Cueing Cueing for safe proximity to chair prior to sitting with inconsistent within-session carryover.   Ambulation/Gait   Ambulation/Gait Yes   Ambulation/Gait Assistance 4: Min  guard   Ambulation/Gait Assistance Details Gait 2 x110' with rollator with cueing for increased B step height/length, B heel strike. Gait x80' with RW, blue Tband at front as visual aid for increased step length/height was grossly ineffective in improving gait pattern.    Ambulation Distance (Feet) 300 Feet   Assistive device 4-wheeled walker;Rolling walker   Gait Pattern Decreased stride length;Decreased dorsiflexion - right;Decreased dorsiflexion - left;Decreased weight shift to right;Trendelenburg;Shuffle;Trunk flexed;Poor foot clearance - right;Decreased hip/knee flexion - right;Decreased hip/knee flexion - left;Step-through pattern;Decreased step length - right   Ambulation Surface Level;Indoor   Exercises   Exercises Other Exercises   Other Exercises  Reviewed the following 2 exercises provided during previous session: seated LAQ x10 reps per side; sit <> stand  x10 reps. Added seated marching x20 reps (if pt unsupervised) and standing, marching x10 reps with BUE support (only if caregiver present) to promote increased foot clearance during gait. Pt required constant cueing to remain awake during final 15 minutes of this session.                PT Education - 08/13/15 1524    Education provided Yes   Education Details HEP progressed.  Requested that family friend, Dellia Nims, (providing transportation today) ask pt's caregiver 1) to make sure pt took all  medications this morning and 2) to contact this PT to discuss concerns.   Person(s) Educated Patient;Other (comment)  family friend, Dellia Nims   Methods Explanation;Demonstration   Comprehension Verbalized understanding          PT Short Term Goals - 08/07/15 1634    PT SHORT TERM GOAL #1   Title Pt will initiate HEP for strengthening and balance to indicate decreased fall risk and improved functional mobility.  (Target Date: 09/01/15)   PT SHORT TERM GOAL #2   Title Will assess BERG balance test and improve score by 4 points from  baseline to indicate decreased fall risk.    Baseline Pt scored 6/56 on BERG on 08/06/15   PT SHORT TERM GOAL #3   Title Pt will increase gait speed to 1.2 ft/sec in order to indicate decreased fall risk and improved efficiency of gait.     PT SHORT TERM GOAL #4   Title Pt will verbalize understanding of fall prevention strategies to decrease fall risk at home.    PT SHORT TERM GOAL #5   Title Pt will ambulate >200' over varying indoor surfaces with rollator at mod I level to indicate safety with AD/ambulation at home.             PT Long Term Goals - 08/04/15 1524    PT LONG TERM GOAL #1   Title Pt will be independent (with caregiver) with HEP in order to indicate decreased fall risk and improved functional mobility.  (Target Date: 09/29/15)   PT LONG TERM GOAL #2   Title Pt will improve BERG balance test score 6 points from baseline in order to indicate decreased fall risk.     PT LONG TERM GOAL #3   Title Pt will perform dynamic standing balance with single UE support x 5 mins at mod I level in order to increase independence with ADLs and functional mobility.     PT LONG TERM GOAL #4   Title Pt will ambulate 300' over paved outdoor surfaces with rollator at S level in order to indicate safe community negotiation.                 Plan - 08/13/15 2014    Clinical Impression Statement Session focused on functional transfers and LE strengthening. Note that between-session carryover is poor and that pt would benefit from having a caregiver present to maximize benefit of PT. Pt's particiipation again limited by pt difficulty staying awake during this session. Resting BP of 176/64 is concerning given pt's PMH of CVA. Requested that family friend, Dellia Nims, (providing transportation today) ask pt's caregiver 1) to make sure pt took all medications this morning and 2) to contact this PT to discuss concerns. Family friend verbally agreed.   Pt will benefit from skilled therapeutic intervention  in order to improve on the following deficits Abnormal gait;Decreased activity tolerance;Decreased balance;Decreased cognition;Decreased coordination;Decreased endurance;Decreased knowledge of precautions;Decreased knowledge of use of DME;Decreased mobility;Decreased safety awareness;Decreased strength;Impaired perceived functional ability;Impaired flexibility;Impaired sensation;Impaired UE functional use;Improper body mechanics;Postural dysfunction   Rehab Potential Good   Clinical Impairments Affecting Rehab Potential years post CVA, co-morbidities, pre-morbid habits   PT Frequency 2x / week   PT Duration 8 weeks   PT Treatment/Interventions ADLs/Self Care Home Management;Electrical Stimulation;DME Instruction;Gait training;Stair training;Functional mobility training;Therapeutic activities;Therapeutic exercise;Balance training;Neuromuscular re-education;Cognitive remediation;Patient/family education;Orthotic Fit/Training;Energy conservation   PT Next Visit Plan Review HEP. Gait training, standing balance, safety with transfers.   Consulted and Agree with Plan of Care Patient  Problem List Patient Active Problem List   Diagnosis Date Noted  . Rhabdomyolysis 01/13/2015  . HLD (hyperlipidemia) 01/13/2015  . Diabetes type 2, controlled (HCC) 01/13/2015  . Faintness   . CVA (cerebral infarction) 10/25/2011  . UTI (lower urinary tract infection) 10/25/2011  . Dehydration 10/25/2011  . Syncope 10/23/2011  . H/O: CVA (cerebrovascular accident) 10/23/2011  . Hypertension 10/23/2011  . Diabetes mellitus (HCC) 10/23/2011   Jorje Guild, PT, DPT [facility] [address] [place], [place], [address] Phone: [phone]   Fax:  [phone] 08/13/2015, 8:24 PM  Name: [name] MRN: [id number] Date of Birth: Jun 23, 1940

## 2015-08-13 NOTE — Patient Instructions (Addendum)
Functional Quadriceps: Sit to Stand    Sit on edge of chair, feet flat on floor with walker in front of you. Use both arms on chair to push up to standing. Don't push your legs back against the chair. Stand upright, extending knees fully while holding onto walker. Reach hands back onto arms of chair and slowly lower self down to have a seat back in the chair. Repeat 10 times per set. Do 3 sessions per day.  http://orth.exer.us/735   Copyright  VHI. All rights reserved.   Quadriceps - Seated    Sit straight in a chair. Extended knee fully. Repeat for 10 times. Do on other leg.  Do 3 times per day.  Seated Alternating Leg Raise (Marching)    Sit in a chair with a back on it. Sit upright in the chair so you're not touching the chair back. March in seated with high knees. Perform 10 reps on each leg without resting on the chair back. Perform 3 times per day.   Kenasia, do this final exercise only when Laverne or a family member are standing next to you.  "I love a Parade" Lift    With both hands on your locked rollator (or a countertop), march in place with high knees. Do this 10 times with each leg.  Perform this exercise 3 times per day.  

## 2015-08-18 ENCOUNTER — Ambulatory Visit: Payer: Commercial Managed Care - HMO | Admitting: Physical Therapy

## 2015-08-18 DIAGNOSIS — R269 Unspecified abnormalities of gait and mobility: Secondary | ICD-10-CM

## 2015-08-18 DIAGNOSIS — R531 Weakness: Secondary | ICD-10-CM

## 2015-08-18 DIAGNOSIS — R29898 Other symptoms and signs involving the musculoskeletal system: Secondary | ICD-10-CM | POA: Diagnosis not present

## 2015-08-18 DIAGNOSIS — R2991 Unspecified symptoms and signs involving the musculoskeletal system: Secondary | ICD-10-CM

## 2015-08-18 DIAGNOSIS — R2681 Unsteadiness on feet: Secondary | ICD-10-CM | POA: Diagnosis not present

## 2015-08-18 DIAGNOSIS — R29818 Other symptoms and signs involving the nervous system: Secondary | ICD-10-CM

## 2015-08-18 NOTE — Therapy (Signed)
[facility] [address] [facility] [place], [place], [address] Phone: [phone]   Fax:  [phone]  Physical Therapy Treatment  Patient Details  Name: [name] MRN: [id number] Date of Birth: 11/30/1940 Referring Provider: Georgann Housekeeper, MD  Encounter Date: 08/18/2015      PT End of Session - 08/18/15 1013    Visit Number 4   Number of Visits 17   Date for PT Re-Evaluation 10/03/15   Authorization Type [employer] H[employer]-G code every 10th visit   PT Start Time [phone]  pt arrived late   PT Stop Time 1013   PT Time Calculation (min) 31 min   Equipment Utilized During Treatment Gait belt   Activity Tolerance Patient tolerated treatment well   Behavior During Therapy [facility] for tasks assessed/performed;Flat affect      Past Medical History  Diagnosis Date  . Stroke (HCC)   . Hypertension   . Hypercholesteremia   . Diabetes mellitus     Past Surgical History  Procedure Laterality Date  . Tooth extraction      There were no vitals filed for this visit.  Visit Diagnosis:  Abnormality of gait  Unsteadiness  Right leg weakness  Generalized weakness  Poor trunk control      Subjective Assessment - 08/18/15 0946    Subjective Everything is going "just fine."  no falls   Limitations House hold activities;Walking;Standing   Patient Stated Goals "I want to walk better."    Currently in Pain? No/denies                         [facility] Adult PT Treatment/Exercise - 08/18/15 0946    Transfers   Transfers Sit to Stand   Sit to Stand 5: Supervision   Sit to Stand Details Verbal cues for technique;Verbal cues for precautions/safety   Sit to Stand Details (indicate cue type and reason) VCs for safe hand placement   Stand to Sit 5: Supervision   Stand to Sit Details (indicate cue type and reason) Verbal cues for sequencing;Verbal cues for precautions/safety   Ambulation/Gait   Ambulation/Gait Yes   Ambulation/Gait Assistance 5: Supervision   Ambulation/Gait Assistance Details cues for step length; pt stated "no" when asked to take bigger steps   Ambulation Distance (Feet) 300 Feet  220'; 80'   Assistive device 4-wheeled walker   Gait Pattern Decreased stride length;Decreased dorsiflexion - right;Decreased dorsiflexion - left;Decreased weight shift to right;Trendelenburg;Shuffle;Trunk flexed;Poor foot clearance - right;Decreased hip/knee flexion - right;Decreased hip/knee flexion - left;Step-through pattern;Decreased step length - right   Ambulation Surface Level;Indoor   Exercises   Exercises Knee/Hip   Knee/Hip Exercises: Standing   Heel Raises Both;10 reps   Heel Raises Limitations toe raises x 10   Hip Flexion Both;10 reps;Knee bent   Hip Abduction Both;10 reps;Knee straight   Knee/Hip Exercises: Seated   Long Arc Quad Both;10 reps   Marching Both;10 reps                  PT Short Term Goals - 08/07/15 1634    PT SHORT TERM GOAL #1   Title Pt will initiate HEP for strengthening and balance to indicate decreased fall risk and improved functional mobility.  (Target Date: 09/01/15)   PT SHORT TERM GOAL #2   Title Will assess BERG balance test and improve score by 4 points from baseline to indicate decreased fall risk.    Baseline Pt scored 6/56 on BERG  on 08/06/15   PT SHORT TERM GOAL #3   Title Pt will increase gait speed to 1.2 ft/sec in order to indicate decreased fall risk and improved efficiency of gait.     PT SHORT TERM GOAL #4   Title Pt will verbalize understanding of fall prevention strategies to decrease fall risk at home.    PT SHORT TERM GOAL #5   Title Pt will ambulate >200' over varying indoor surfaces with rollator at mod I level to indicate safety with AD/ambulation at home.             PT Long Term Goals - 08/04/15 1524    PT LONG TERM GOAL #1   Title Pt will be independent (with caregiver) with HEP in order to indicate decreased fall risk and  improved functional mobility.  (Target Date: 09/29/15)   PT LONG TERM GOAL #2   Title Pt will improve BERG balance test score 6 points from baseline in order to indicate decreased fall risk.     PT LONG TERM GOAL #3   Title Pt will perform dynamic standing balance with single UE support x 5 mins at mod I level in order to increase independence with ADLs and functional mobility.     PT LONG TERM GOAL #4   Title Pt will ambulate 300' over paved outdoor surfaces with rollator at S level in order to indicate safe community negotiation.                 Plan - 08/18/15 1013    Clinical Impression Statement Pt reluctant to increase step length due to fear of falling.  Limited carryover even during session and no caregivers present to assist with cues at home or exercises.  No contact with caregiver today.   PT Next Visit Plan Review HEP. Gait training, standing balance, safety with transfers.   Consulted and Agree with Plan of Care Patient        Problem List Patient Active Problem List   Diagnosis Date Noted  . Rhabdomyolysis 01/13/2015  . HLD (hyperlipidemia) 01/13/2015  . Diabetes type 2, controlled (HCC) 01/13/2015  . Faintness   . CVA (cerebral infarction) 10/25/2011  . UTI (lower urinary tract infection) 10/25/2011  . Dehydration 10/25/2011  . Syncope 10/23/2011  . H/O: CVA (cerebrovascular accident) 10/23/2011  . Hypertension 10/23/2011  . Diabetes mellitus (HCC) 10/23/2011   Clarita Crane, PT, DPT 08/18/2015 10:15 AM  [place] [facility] [address] [place], [place], [address] Phone: [phone]   Fax:  [phone]  Name: BERENIZ TOCCI MRN: [id number] Date of Birth: 12-16-1940

## 2015-08-20 ENCOUNTER — Ambulatory Visit: Payer: Commercial Managed Care - HMO | Admitting: Physical Therapy

## 2015-08-20 DIAGNOSIS — R2991 Unspecified symptoms and signs involving the musculoskeletal system: Secondary | ICD-10-CM

## 2015-08-20 DIAGNOSIS — R269 Unspecified abnormalities of gait and mobility: Secondary | ICD-10-CM | POA: Diagnosis not present

## 2015-08-20 DIAGNOSIS — R531 Weakness: Secondary | ICD-10-CM

## 2015-08-20 DIAGNOSIS — R2681 Unsteadiness on feet: Secondary | ICD-10-CM | POA: Diagnosis not present

## 2015-08-20 DIAGNOSIS — R29898 Other symptoms and signs involving the musculoskeletal system: Secondary | ICD-10-CM

## 2015-08-20 DIAGNOSIS — R29818 Other symptoms and signs involving the nervous system: Secondary | ICD-10-CM

## 2015-08-20 NOTE — Therapy (Signed)
[facility] [address] [place], [place], [address] Phone: [phone]   Fax:  [phone]  Physical Therapy Treatment  Patient Details  Name: [name] MRN: [id number] Date of Birth: Oct 01, 1940 Referring Provider: Georgann Housekeeper, MD  Encounter Date: 08/20/2015      PT End of Session - 08/20/15 1022    Visit Number 5   Date for PT Re-Evaluation 10/03/15   PT Start Time 0937  pt arrived late   PT Stop Time 1017   PT Time Calculation (min) 40 min   Equipment Utilized During Treatment Gait belt   Activity Tolerance Patient tolerated treatment well   Behavior During Therapy [facility] for tasks assessed/performed;Flat affect      Past Medical History  Diagnosis Date  . Stroke (HCC)   . Hypertension   . Hypercholesteremia   . Diabetes mellitus     Past Surgical History  Procedure Laterality Date  . Tooth extraction      There were no vitals filed for this visit.  Visit Diagnosis:  Abnormality of gait  Unsteadiness  Right leg weakness  Generalized weakness  Poor trunk control      Subjective Assessment - 08/20/15 0942    Subjective doing exercises in bed at home.  using rollator at home.  no caregiver in lobby upon receiving pt for therapy.   Patient Stated Goals "I want to walk better."    Currently in Pain? No/denies                         [facility] Adult PT Treatment/Exercise - 08/20/15 0943    Ambulation/Gait   Ambulation/Gait Yes   Ambulation/Gait Assistance 5: Supervision   Ambulation/Gait Assistance Details cues for step length; pt with limited carryover and minimal change in step length   Ambulation Distance (Feet) 340 Feet  150, 115; 75   Assistive device 4-wheeled walker   Gait Pattern Decreased stride length;Decreased dorsiflexion - right;Decreased dorsiflexion - left;Decreased weight shift to right;Trendelenburg;Shuffle;Trunk flexed;Poor foot clearance - right;Decreased  hip/knee flexion - right;Decreased hip/knee flexion - left;Step-through pattern;Decreased step length - right   Ambulation Surface Level;Indoor   Gait velocity 0.74 ft/sec  43.81 sec (pt stopped near end to ask question)   Knee/Hip Exercises: Seated   Sit to Sand 10 reps;with UE support  with Min A   Knee/Hip Exercises: Supine   Bridges Limitations x10   Straight Leg Raises Both;10 reps   Other Supine Knee/Hip Exercises clamshells with yellow tband x 10   Other Supine Knee/Hip Exercises hooklying marching with yellow tband x 10 bil                  PT Short Term Goals - 08/07/15 1634    PT SHORT TERM GOAL #1   Title Pt will initiate HEP for strengthening and balance to indicate decreased fall risk and improved functional mobility.  (Target Date: 09/01/15)   PT SHORT TERM GOAL #2   Title Will assess BERG balance test and improve score by 4 points from baseline to indicate decreased fall risk.    Baseline Pt scored 6/56 on BERG on 08/06/15   PT SHORT TERM GOAL #3   Title Pt will increase gait speed to 1.2 ft/sec in order to indicate decreased fall risk and improved efficiency of gait.     PT SHORT TERM GOAL #4   Title Pt will verbalize understanding of fall prevention strategies to decrease fall risk at  home.    PT SHORT TERM GOAL #5   Title Pt will ambulate >200' over varying indoor surfaces with rollator at mod I level to indicate safety with AD/ambulation at home.             PT Long Term Goals - 08/04/15 1524    PT LONG TERM GOAL #1   Title Pt will be independent (with caregiver) with HEP in order to indicate decreased fall risk and improved functional mobility.  (Target Date: 09/29/15)   PT LONG TERM GOAL #2   Title Pt will improve BERG balance test score 6 points from baseline in order to indicate decreased fall risk.     PT LONG TERM GOAL #3   Title Pt will perform dynamic standing balance with single UE support x 5 mins at mod I level in order to increase independence  with ADLs and functional mobility.     PT LONG TERM GOAL #4   Title Pt will ambulate 300' over paved outdoor surfaces with rollator at S level in order to indicate safe community negotiation.                 Plan - 08/20/15 1022    Clinical Impression Statement No caregiver present in lobby at beginning of session.  Caregiver available at end of session and recommended a caregiver be present at her next session.  Pt with slight improvement in gait velocity today.   PT Next Visit Plan Review HEP. Gait training, standing balance, safety with transfers.   Consulted and Agree with Plan of Care Patient        Problem List Patient Active Problem List   Diagnosis Date Noted  . Rhabdomyolysis 01/13/2015  . HLD (hyperlipidemia) 01/13/2015  . Diabetes type 2, controlled (HCC) 01/13/2015  . Faintness   . CVA (cerebral infarction) 10/25/2011  . UTI (lower urinary tract infection) 10/25/2011  . Dehydration 10/25/2011  . Syncope 10/23/2011  . H/O: CVA (cerebrovascular accident) 10/23/2011  . Hypertension 10/23/2011  . Diabetes mellitus (HCC) 10/23/2011   Clarita Crane, PT, DPT 08/20/2015 10:25 AM  [facility] [address] [place], [place], [address] Phone: [phone]   Fax:  [phone]  Name: NINI ROUDEBUSH MRN: [id number] Date of Birth: June 04, 1940

## 2015-08-25 ENCOUNTER — Ambulatory Visit: Payer: Commercial Managed Care - HMO | Admitting: Physical Therapy

## 2015-08-25 ENCOUNTER — Encounter: Payer: Self-pay | Admitting: Physical Therapy

## 2015-08-25 DIAGNOSIS — R269 Unspecified abnormalities of gait and mobility: Secondary | ICD-10-CM | POA: Diagnosis not present

## 2015-08-25 DIAGNOSIS — R531 Weakness: Secondary | ICD-10-CM | POA: Diagnosis not present

## 2015-08-25 DIAGNOSIS — R29898 Other symptoms and signs involving the musculoskeletal system: Secondary | ICD-10-CM | POA: Diagnosis not present

## 2015-08-25 DIAGNOSIS — R29818 Other symptoms and signs involving the nervous system: Secondary | ICD-10-CM

## 2015-08-25 DIAGNOSIS — R2991 Unspecified symptoms and signs involving the musculoskeletal system: Secondary | ICD-10-CM | POA: Diagnosis not present

## 2015-08-25 DIAGNOSIS — R2681 Unsteadiness on feet: Secondary | ICD-10-CM | POA: Diagnosis not present

## 2015-08-25 NOTE — Patient Instructions (Signed)
Functional Quadriceps: Sit to Stand    Sit on edge of chair, feet flat on floor with walker in front of you. Use both arms on chair to push up to standing. Don't push your legs back against the chair. Stand upright, extending knees fully while holding onto walker. Reach hands back onto arms of chair and slowly lower self down to have a seat back in the chair. Repeat 10 times per set. Do 3 sessions per day.  http://orth.exer.us/735   Copyright  VHI. All rights reserved.   Quadriceps - Seated    Sit straight in a chair. Extended knee fully. Repeat for 10 times. Do on other leg.  Do 3 times per day.  Seated Alternating Leg Raise (Marching)    Sit in a chair with a back on it. Sit upright in the chair so you're not touching the chair back. March in seated with high knees. Perform 10 reps on each leg without resting on the chair back. Perform 3 times per day.   Nikyah, do this final exercise only when Laverne or a family member are standing next to you.  "I love a Parade" Lift    With both hands on your locked rollator (or a countertop), march in place with high knees. Do this 10 times with each leg.  Perform this exercise 3 times per day.

## 2015-08-25 NOTE — Therapy (Signed)
[facility] [address] [place], [place], [address] Phone: [phone]   Fax:  [phone]  Physical Therapy Treatment  Patient Details  Name: [name] MRN: [id number] Date of Birth: 23-Mar-1941 Referring Provider: Georgann Housekeeper, MD  Encounter Date: 08/25/2015      PT End of Session - 08/25/15 1017    Visit Number 6   Date for PT Re-Evaluation 10/03/15   PT Start Time 0935   PT Stop Time 1015   PT Time Calculation (min) 40 min   Equipment Utilized During Treatment Gait belt   Activity Tolerance Patient tolerated treatment well   Behavior During Therapy [facility] for tasks assessed/performed;Flat affect      Past Medical History  Diagnosis Date  . Stroke (HCC)   . Hypertension   . Hypercholesteremia   . Diabetes mellitus     Past Surgical History  Procedure Laterality Date  . Tooth extraction      There were no vitals filed for this visit.  Visit Diagnosis:  Unsteadiness  Right leg weakness  Generalized weakness  Poor trunk control      Subjective Assessment - 08/25/15 0942    Subjective Caregiver states that he helps pt with her exercises at home.   Patient Stated Goals "I want to walk better."    Currently in Pain? No/denies                         [facility] Adult PT Treatment/Exercise - 08/25/15 0001    Transfers   Transfers Sit to Stand;Stand to Sit   Sit to Stand 5: Supervision   Sit to Stand Details Tactile cues for sequencing;Tactile cues for weight shifting;Tactile cues for placement   Knee/Hip Exercises: Aerobic   Nustep L=3 10 min                PT Education - 08/25/15 0948    Education provided Yes   Education Details Performed and Reviewed HEP given 3/15 see handout below and safe technique with transfer with caregiver present to assist with carryover.   Person(s) Educated Secretary/administrator)  friend   Methods Explanation;Demonstration;Tactile  cues;Verbal cues   Comprehension Verbalized understanding;Returned demonstration;Verbal cues required;Tactile cues required;Need further instruction          PT Short Term Goals - 08/07/15 1634    PT SHORT TERM GOAL #1   Title Pt will initiate HEP for strengthening and balance to indicate decreased fall risk and improved functional mobility.  (Target Date: 09/01/15)   PT SHORT TERM GOAL #2   Title Will assess BERG balance test and improve score by 4 points from baseline to indicate decreased fall risk.    Baseline Pt scored 6/56 on BERG on 08/06/15   PT SHORT TERM GOAL #3   Title Pt will increase gait speed to 1.2 ft/sec in order to indicate decreased fall risk and improved efficiency of gait.     PT SHORT TERM GOAL #4   Title Pt will verbalize understanding of fall prevention strategies to decrease fall risk at home.    PT SHORT TERM GOAL #5   Title Pt will ambulate >200' over varying indoor surfaces with rollator at mod I level to indicate safety with AD/ambulation at home.             PT Long Term Goals - 08/04/15 1524    PT LONG TERM GOAL #1   Title Pt will be independent (with caregiver) with  HEP in order to indicate decreased fall risk and improved functional mobility.  (Target Date: 09/29/15)   PT LONG TERM GOAL #2   Title Pt will improve BERG balance test score 6 points from baseline in order to indicate decreased fall risk.     PT LONG TERM GOAL #3   Title Pt will perform dynamic standing balance with single UE support x 5 mins at mod I level in order to increase independence with ADLs and functional mobility.     PT LONG TERM GOAL #4   Title Pt will ambulate 300' over paved outdoor surfaces with rollator at S level in order to indicate safe community negotiation.                 Plan - 08/25/15 1019    Clinical Impression Statement Caregiver present this session to assist with carrryover with HEP and mobility training; understood the importance of assisting pt and  reported that the caregivirs have been assisting pt at home.  Pt continues to require constant cues  for HEP technique and mobility technique performing at an overall supervision level.                                                                          Plan:         Standing balance, gait, and safety with transfers.            Problem List Patient Active Problem List   Diagnosis Date Noted  . Rhabdomyolysis 01/13/2015  . HLD (hyperlipidemia) 01/13/2015  . Diabetes type 2, controlled (HCC) 01/13/2015  . Faintness   . CVA (cerebral infarction) 10/25/2011  . UTI (lower urinary tract infection) 10/25/2011  . Dehydration 10/25/2011  . Syncope 10/23/2011  . H/O: CVA (cerebrovascular accident) 10/23/2011  . Hypertension 10/23/2011  . Diabetes mellitus (HCC) 10/23/2011   Hortencia Conradi, PTA  08/25/2015, 10:31 AM  [facility] [address] [place], [place], [address] Phone: [phone]   Fax:  [phone]  Name: [name] MRN: [id number] Date of Birth: 08/29/1940

## 2015-08-27 ENCOUNTER — Ambulatory Visit: Payer: Commercial Managed Care - HMO | Admitting: Physical Therapy

## 2015-08-27 DIAGNOSIS — R531 Weakness: Secondary | ICD-10-CM

## 2015-08-27 DIAGNOSIS — R2681 Unsteadiness on feet: Secondary | ICD-10-CM | POA: Diagnosis not present

## 2015-08-27 DIAGNOSIS — R29898 Other symptoms and signs involving the musculoskeletal system: Secondary | ICD-10-CM

## 2015-08-27 DIAGNOSIS — R269 Unspecified abnormalities of gait and mobility: Secondary | ICD-10-CM | POA: Diagnosis not present

## 2015-08-27 DIAGNOSIS — R2991 Unspecified symptoms and signs involving the musculoskeletal system: Secondary | ICD-10-CM

## 2015-08-27 DIAGNOSIS — R29818 Other symptoms and signs involving the nervous system: Secondary | ICD-10-CM

## 2015-08-27 NOTE — Therapy (Signed)
[facility] [address] [place], [place], [address] Phone: [phone]   Fax:  [phone]  Physical Therapy Treatment  Patient Details  Name: [name] MRN: [id number] Date of Birth: 10-06-1940 Referring Provider: Wenda Low, MD  Encounter Date: 08/27/2015      PT End of Session - 08/27/15 1013    Visit Number 7   Number of Visits 17   Date for PT Re-Evaluation 10/03/15   Authorization Type [employer] H[employer]-G code every 10th visit   PT Start Time [phone]  pt arrived late   PT Stop Time 1012   PT Time Calculation (min) 29 min   Equipment Utilized During Treatment Gait belt   Activity Tolerance Patient tolerated treatment well   Behavior During Therapy [facility] for tasks assessed/performed      Past Medical History  Diagnosis Date  . Stroke ([place])   . Hypertension   . Hypercholesteremia   . Diabetes mellitus     Past Surgical History  Procedure Laterality Date  . Tooth extraction      There were no vitals filed for this visit.  Visit Diagnosis:  Unsteadiness  Right leg weakness  Generalized weakness  Poor trunk control  Abnormality of gait      Subjective Assessment - 08/27/15 0947    Subjective "Fine. Just fine."   Patient Stated Goals "I want to walk better."    Currently in Pain? No/denies            [facility] PT Assessment - 08/27/15 0955    Ambulation/Gait   Ambulation Distance (Feet) 200 Feet  125, 50                     [facility] Adult PT Treatment/Exercise - 08/27/15 0955    Ambulation/Gait   Ambulation/Gait Yes   Ambulation/Gait Assistance 5: Supervision   Ambulation/Gait Assistance Details cues for heel strike and step length   Assistive device Rollator   Gait Pattern Decreased stride length;Decreased dorsiflexion - right;Decreased dorsiflexion - left;Decreased weight shift to right;Trendelenburg;Shuffle;Trunk flexed;Poor foot clearance - right;Decreased hip/knee flexion  - right;Decreased hip/knee flexion - left;Step-through pattern;Decreased step length - right   Gait velocity 0.97 ft/sec  33.9 sec   Gait Comments poor carryover between sessions and during sessions; no significant change in gait even with cues   Knee/Hip Exercises: Standing   Heel Raises Both;10 reps   Heel Raises Limitations toe raises x 10   Hip Abduction Both;10 reps;Knee straight   Hip Extension Both;10 reps;Knee bent             Balance Exercises - 08/27/15 1006    Balance Exercises: Standing   Other Standing Exercises overhead reaching with slight lateral weight shift x 10 bil alt with minguard A; lateral weight shfiting x 10 bil             PT Short Term Goals - 08/27/15 1014    PT SHORT TERM GOAL #1   Title Pt will initiate HEP for strengthening and balance to indicate decreased fall risk and improved functional mobility.  (Target Date: 09/01/15)   Baseline with caregiver assistance   Status Achieved   PT SHORT TERM GOAL #2   Title Will assess BERG balance test and improve score by 4 points from baseline to indicate decreased fall risk.    Status On-going   PT SHORT TERM GOAL #3   Title Pt will increase gait speed to 1.2 ft/sec in order to indicate decreased  fall risk and improved efficiency of gait.     Status Not Met   PT SHORT TERM GOAL #4   Title Pt will verbalize understanding of fall prevention strategies to decrease fall risk at home.    Status On-going   PT SHORT TERM GOAL #5   Title Pt will ambulate >200' over varying indoor surfaces with rollator at mod I level to indicate safety with AD/ambulation at home.     Status Not Met           PT Long Term Goals - 08/04/15 1524    PT LONG TERM GOAL #1   Title Pt will be independent (with caregiver) with HEP in order to indicate decreased fall risk and improved functional mobility.  (Target Date: 09/29/15)   PT LONG TERM GOAL #2   Title Pt will improve BERG balance test score 6 points from baseline in  order to indicate decreased fall risk.     PT LONG TERM GOAL #3   Title Pt will perform dynamic standing balance with single UE support x 5 mins at mod I level in order to increase independence with ADLs and functional mobility.     PT LONG TERM GOAL #4   Title Pt will ambulate 300' over paved outdoor surfaces with rollator at S level in order to indicate safe community negotiation.                 Plan - 08/27/15 1013    Clinical Impression Statement Pt with limited progress and minimal carryover during session  and in between sessions.  Anticiapte d/c next 1-2 visits.   PT Next Visit Plan finish assessing goals and plan for d/c   Consulted and Agree with Plan of Care Patient        Problem List Patient Active Problem List   Diagnosis Date Noted  . Rhabdomyolysis 01/13/2015  . HLD (hyperlipidemia) 01/13/2015  . Diabetes type 2, controlled ([place]) 01/13/2015  . Faintness   . CVA (cerebral infarction) 10/25/2011  . UTI (lower urinary tract infection) 10/25/2011  . Dehydration 10/25/2011  . Syncope 10/23/2011  . H/O: CVA (cerebrovascular accident) 10/23/2011  . Hypertension 10/23/2011  . Diabetes mellitus ([place]) 10/23/2011   [name], PT, DPT 08/27/2015 10:15 AM  [facility] [address] [place], [place], [address] Phone: [phone]   Fax:  [phone]  Name: [name] MRN: [id number] Date of Birth: 17-May-1941

## 2015-08-28 DIAGNOSIS — E113393 Type 2 diabetes mellitus with moderate nonproliferative diabetic retinopathy without macular edema, bilateral: Secondary | ICD-10-CM | POA: Diagnosis not present

## 2015-09-01 ENCOUNTER — Encounter: Payer: Self-pay | Admitting: Physical Therapy

## 2015-09-01 ENCOUNTER — Ambulatory Visit: Payer: Commercial Managed Care - HMO | Admitting: Physical Therapy

## 2015-09-01 ENCOUNTER — Ambulatory Visit: Payer: Commercial Managed Care - HMO | Attending: Internal Medicine | Admitting: Physical Therapy

## 2015-09-01 DIAGNOSIS — R2689 Other abnormalities of gait and mobility: Secondary | ICD-10-CM | POA: Insufficient documentation

## 2015-09-01 DIAGNOSIS — R269 Unspecified abnormalities of gait and mobility: Secondary | ICD-10-CM | POA: Insufficient documentation

## 2015-09-01 DIAGNOSIS — R2681 Unsteadiness on feet: Secondary | ICD-10-CM | POA: Diagnosis not present

## 2015-09-01 DIAGNOSIS — M6281 Muscle weakness (generalized): Secondary | ICD-10-CM | POA: Insufficient documentation

## 2015-09-01 DIAGNOSIS — R531 Weakness: Secondary | ICD-10-CM | POA: Insufficient documentation

## 2015-09-01 DIAGNOSIS — R29898 Other symptoms and signs involving the musculoskeletal system: Secondary | ICD-10-CM | POA: Insufficient documentation

## 2015-09-01 NOTE — Therapy (Signed)
[facility] [address] [place], [place], [address] Phone: [phone]   Fax:  [phone]  Physical Therapy Treatment  Patient Details  Name: [name] MRN: [id number] Date of Birth: 17-Nov-1940 Referring Provider: Wenda Low, MD  Encounter Date: 09/01/2015      PT End of Session - 09/01/15 1453    Visit Number 8   Number of Visits 17   Date for PT Re-Evaluation 10/03/15   Authorization Type [employer] H[employer]-G code every 10th visit   PT Start Time 1410   PT Stop Time 1448   PT Time Calculation (min) 38 min   Equipment Utilized During Treatment Gait belt   Activity Tolerance Patient tolerated treatment well   Behavior During Therapy [facility] for tasks assessed/performed      Past Medical History  Diagnosis Date  . Stroke ([place])   . Hypertension   . Hypercholesteremia   . Diabetes mellitus     Past Surgical History  Procedure Laterality Date  . Tooth extraction      There were no vitals filed for this visit.  Visit Diagnosis:  Unsteadiness  Right leg weakness  Generalized weakness  Abnormality of gait      Subjective Assessment - 09/01/15 1425    Subjective "Fine. Just fine."   Patient Stated Goals "I want to walk better."    Currently in Pain? No/denies                         [facility] Adult PT Treatment/Exercise - 09/01/15 0001    Ambulation/Gait   Ambulation/Gait Yes   Ambulation/Gait Assistance 5: Supervision   Ambulation/Gait Assistance Details cues  for increase steplength worked on activity tolerance and balance with head turns and changes in gait speed.   Ambulation Distance (Feet) 300 Feet   Assistive device Rollator   Gait Pattern Poor foot clearance - left;Poor foot clearance - right;Decreased stride length   Ambulation Surface Level;Indoor   Stairs Yes   Stairs Assistance 4: Min guard  2 rails, step to pattern   Number of Stairs 4   Gait Comments No LOB with above  activities   Knee/Hip Exercises: Aerobic   Nustep L=3 64mn + 2 min  needed rest to get to 132m                PT Education - 09/01/15 1452    Education provided Yes   Education Details Discussed D/C possible in the next couple of visits and benefits for continuing to perform HEP and join [employer] walking and nustep.   Person(s) Educated Patient;Caregiver(s)   Methods Explanation;Demonstration   Comprehension Verbalized understanding;Need further instruction          PT Short Term Goals - 08/27/15 1014    PT SHORT TERM GOAL #1   Title Pt will initiate HEP for strengthening and balance to indicate decreased fall risk and improved functional mobility.  (Target Date: 09/01/15)   Baseline with caregiver assistance   Status Achieved   PT SHORT TERM GOAL #2   Title Will assess BERG balance test and improve score by 4 points from baseline to indicate decreased fall risk.    Status On-going   PT SHORT TERM GOAL #3   Title Pt will increase gait speed to 1.2 ft/sec in order to indicate decreased fall risk and improved efficiency of gait.     Status Not Met   PT SHORT TERM GOAL #4  Title Pt will verbalize understanding of fall prevention strategies to decrease fall risk at home.    Status On-going   PT SHORT TERM GOAL #5   Title Pt will ambulate >200' over varying indoor surfaces with rollator at mod I level to indicate safety with AD/ambulation at home.     Status Not Met           PT Long Term Goals - 08/04/15 1524    PT LONG TERM GOAL #1   Title Pt will be independent (with caregiver) with HEP in order to indicate decreased fall risk and improved functional mobility.  (Target Date: 09/29/15)   PT LONG TERM GOAL #2   Title Pt will improve BERG balance test score 6 points from baseline in order to indicate decreased fall risk.     PT LONG TERM GOAL #3   Title Pt will perform dynamic standing balance with single UE support x 5 mins at mod I level in order to increase  independence with ADLs and functional mobility.     PT LONG TERM GOAL #4   Title Pt will ambulate 300' over paved outdoor surfaces with rollator at S level in order to indicate safe community negotiation.                 Plan - 09/01/15 1431    Clinical Impression Statement Skilled session focused on activity tolerance with gait and nustep; pt was fatigued with both but able to go further with encouragement. Discussed with Pt and caregiver possible d/c in the next couple of visits and need for good fitness program; both verbalized understanding.   PT Next Visit Plan finish assessing goals and plan for d/c; give information on [facility].   Consulted and Agree with Plan of Care Patient        Problem List Patient Active Problem List   Diagnosis Date Noted  . Rhabdomyolysis 01/13/2015  . HLD (hyperlipidemia) 01/13/2015  . Diabetes type 2, controlled ([place]) 01/13/2015  . Faintness   . CVA (cerebral infarction) 10/25/2011  . UTI (lower urinary tract infection) 10/25/2011  . Dehydration 10/25/2011  . Syncope 10/23/2011  . H/O: CVA (cerebrovascular accident) 10/23/2011  . Hypertension 10/23/2011  . Diabetes mellitus ([place]) 10/23/2011    Bjorn Loser, PTA  09/01/2015, 2:57 PM   [facility] [address] [place], [place], [address] Phone: [phone]   Fax:  [phone]  Name: [name] MRN: [id number] Date of Birth: 02-21-1941

## 2015-09-02 ENCOUNTER — Encounter: Payer: Self-pay | Admitting: Podiatry

## 2015-09-02 ENCOUNTER — Ambulatory Visit (INDEPENDENT_AMBULATORY_CARE_PROVIDER_SITE_OTHER): Payer: Commercial Managed Care - HMO | Admitting: Podiatry

## 2015-09-02 DIAGNOSIS — M79676 Pain in unspecified toe(s): Secondary | ICD-10-CM

## 2015-09-02 DIAGNOSIS — B351 Tinea unguium: Secondary | ICD-10-CM | POA: Diagnosis not present

## 2015-09-02 NOTE — Patient Instructions (Signed)
Diabetes and Foot Care Diabetes may cause you to have problems because of poor blood supply (circulation) to your feet and legs. This may cause the skin on your feet to become thinner, break easier, and heal more slowly. Your skin may become dry, and the skin may peel and crack. You may also have nerve damage in your legs and feet causing decreased feeling in them. You may not notice minor injuries to your feet that could lead to infections or more serious problems. Taking care of your feet is one of the most important things you can do for yourself.  HOME CARE INSTRUCTIONS  Wear shoes at all times, even in the house. Do not go barefoot. Bare feet are easily injured.  Check your feet daily for blisters, cuts, and redness. If you cannot see the bottom of your feet, use a mirror or ask someone for help.  Wash your feet with warm water (do not use hot water) and mild soap. Then pat your feet and the areas between your toes until they are completely dry. Do not soak your feet as this can dry your skin.  Apply a moisturizing lotion or petroleum jelly (that does not contain alcohol and is unscented) to the skin on your feet and to dry, brittle toenails. Do not apply lotion between your toes.  Trim your toenails straight across. Do not dig under them or around the cuticle. File the edges of your nails with an emery board or nail file.  Do not cut corns or calluses or try to remove them with medicine.  Wear clean socks or stockings every day. Make sure they are not too tight. Do not wear knee-high stockings since they may decrease blood flow to your legs.  Wear shoes that fit properly and have enough cushioning. To break in new shoes, wear them for just a few hours a day. This prevents you from injuring your feet. Always look in your shoes before you put them on to be sure there are no objects inside.  Do not cross your legs. This may decrease the blood flow to your feet.  If you find a minor scrape,  cut, or break in the skin on your feet, keep it and the skin around it clean and dry. These areas may be cleansed with mild soap and water. Do not cleanse the area with peroxide, alcohol, or iodine.  When you remove an adhesive bandage, be sure not to damage the skin around it.  If you have a wound, look at it several times a day to make sure it is healing.  Do not use heating pads or hot water bottles. They may burn your skin. If you have lost feeling in your feet or legs, you may not know it is happening until it is too late.  Make sure your health care provider performs a complete foot exam at least annually or more often if you have foot problems. Report any cuts, sores, or bruises to your health care provider immediately. SEEK MEDICAL CARE IF:   You have an injury that is not healing.  You have cuts or breaks in the skin.  You have an ingrown nail.  You notice redness on your legs or feet.  You feel burning or tingling in your legs or feet.  You have pain or cramps in your legs and feet.  Your legs or feet are numb.  Your feet always feel cold. SEEK IMMEDIATE MEDICAL CARE IF:   There is increasing redness,   swelling, or pain in or around a wound.  There is a red line that goes up your leg.  Pus is coming from a wound.  You develop a fever or as directed by your health care provider.  You notice a bad smell coming from an ulcer or wound.   This information is not intended to replace advice given to you by your health care provider. Make sure you discuss any questions you have with your health care provider.   Document Released: 05/14/2000 Document Revised: 01/17/2013 Document Reviewed: 10/24/2012 Elsevier Interactive Patient Education 2016 [employer]vier Inc[employer]  

## 2015-09-02 NOTE — Progress Notes (Signed)
Patient ID: [name], female   DOB: 1940/08/26, 75 y.o.   MRN: [id number]   Subjective: This patient presents today complaining of thickened and elongated toenails and walking wearing shoes and requests toenail debridement. Last visit for a similar service was 10/24/2014. Patient denies history of foot ulceration, claudication, amputation Patient's caregiver present to treatment room  Vascular: DP 2/4 bilaterally PT pulse 1/4 laterally Capillary reflex immediate bilaterally  Neurological: Sensation to 10 g monofilament wire 5/5 bilaterally Vibratory sensation nonreactive bilaterally Ankle reflex equal and reactive bilaterally  Dermatological: No open skin lesions bilaterally The toenails 6-10 are extremely elongated, incurvated, deformed, discolored and tender to direct palpation 6-10  Musculoskeletal: Patient walks with roller walker Pes planus bilaterally HAV right  Assessment: Decreased posterior tibial pulses bilaterally Protective sensation intact bilaterally 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

## 2015-09-03 ENCOUNTER — Ambulatory Visit: Payer: Commercial Managed Care - HMO | Admitting: Physical Therapy

## 2015-09-08 ENCOUNTER — Ambulatory Visit: Payer: Commercial Managed Care - HMO | Admitting: Physical Therapy

## 2015-09-10 ENCOUNTER — Ambulatory Visit: Payer: Commercial Managed Care - HMO | Admitting: Physical Therapy

## 2015-09-15 ENCOUNTER — Ambulatory Visit: Payer: Commercial Managed Care - HMO | Admitting: Physical Therapy

## 2015-09-17 ENCOUNTER — Ambulatory Visit: Payer: Commercial Managed Care - HMO | Admitting: Physical Therapy

## 2015-09-17 DIAGNOSIS — R269 Unspecified abnormalities of gait and mobility: Secondary | ICD-10-CM | POA: Diagnosis not present

## 2015-09-17 DIAGNOSIS — R2689 Other abnormalities of gait and mobility: Secondary | ICD-10-CM

## 2015-09-17 DIAGNOSIS — R2681 Unsteadiness on feet: Secondary | ICD-10-CM

## 2015-09-17 DIAGNOSIS — R29898 Other symptoms and signs involving the musculoskeletal system: Secondary | ICD-10-CM | POA: Diagnosis not present

## 2015-09-17 DIAGNOSIS — R531 Weakness: Secondary | ICD-10-CM | POA: Diagnosis not present

## 2015-09-17 DIAGNOSIS — M6281 Muscle weakness (generalized): Secondary | ICD-10-CM | POA: Diagnosis not present

## 2015-09-17 NOTE — Therapy (Signed)
[facility] [address] [place], [place], [address] Phone: [phone]   Fax:  [phone]  Physical Therapy Treatment and Discharge Summary  Patient Details  Name: MIN TUNNELL MRN: [id number] Date of Birth: 06/18/1940 Referring Provider: Wenda Low, MD  Encounter Date: 09/17/2015      PT End of Session - 09/17/15 2122    Visit Number 9   Number of Visits 17   Date for PT Re-Evaluation 10/03/15   Authorization Type [employer]-G code every 10th visit   PT Start Time 1316   PT Stop Time 1349  Session ended early due to pt discharged   PT Time Calculation (min) 33 min   Activity Tolerance Patient tolerated treatment well   Behavior During Therapy [facility] for tasks assessed/performed      Past Medical History  Diagnosis Date  . Stroke ([place])   . Hypertension   . Hypercholesteremia   . Diabetes mellitus     Past Surgical History  Procedure Laterality Date  . Tooth extraction      There were no vitals filed for this visit.      Subjective Assessment - 09/17/15 1320    Subjective When asked about progress in therapy, pt states, "A little bit. I've learned some things."   Patient is accompained by: Family member  church member, Associate Professor hold activities;Walking;Standing   Patient Stated Goals "I want to walk better."    Currently in Pain? No/denies                         [facility] Adult PT Treatment/Exercise - 09/17/15 0001    Ambulation/Gait   Ambulation/Gait Yes   Ambulation/Gait Assistance 5: Supervision   Ambulation Distance (Feet) 400 Feet   Assistive device Rollator   Gait Pattern Decreased stride length;Decreased dorsiflexion - right;Decreased dorsiflexion - left;Decreased weight shift to right;Trendelenburg;Shuffle;Trunk flexed;Decreased hip/knee flexion - right;Decreased hip/knee flexion - left;Step-through pattern;Decreased step length - right   Gait velocity  1.21 ft/sec   Berg Balance Test   Sit to Stand Needs minimal aid to stand or to stabilize   Standing Unsupported Able to stand 30 seconds unsupported   Sitting with Back Unsupported but Feet Supported on Floor or Stool Able to sit safely and securely 2 minutes   Stand to Sit Sits independently, has uncontrolled descent   Transfers Needs one person to assist   Standing Unsupported with Eyes Closed Needs help to keep from falling   Standing Ubsupported with Feet Together Needs help to attain position and unable to hold for 15 seconds   From Standing, Reach Forward with Outstretched Arm Loses balance while trying/requires external support   From Standing Position, Pick up Object from Floor Unable to try/needs assist to keep balance   From Standing Position, Turn to Look Behind Over each Shoulder Needs assist to keep from losing balance and falling   Turn 360 Degrees Needs assistance while turning   Standing Unsupported, Alternately Place Feet on Step/Stool Needs assistance to keep from falling or unable to try   Standing Unsupported, One Foot in [employer] balance while stepping or standing   Standing on One Leg Unable to try or needs assist to prevent fall   Total Score 9   Self-Care   Self-Care Other Self-Care Comments   Other Self-Care Comments  Provided education on community resources available for adult exercise classes, balance, and fall prevention. See Pt Instructions for details.  Also provided paper handout on [facility] contact information. Explained and demonstrated technique for assisting patient onto/off of NuStep. Church friend, Deneise Lever, gave effective return demo and verbally agreed to relay information to whomever brings pt to workout facility,   Exercises   Exercises Other Exercises   Other Exercises  Pt performed NuStep level 2 resistance with BUE/BLE's x8 minutes with 2 rest breaks for increased functional activity tolerance, to promote functional LE flexion/extension  movement pattern required for gait.                  PT Short Term Goals - 09/17/15 1328    PT SHORT TERM GOAL #1   Title Pt will initiate HEP for strengthening and balance to indicate decreased fall risk and improved functional mobility.  (Target Date: 09/01/15)   Baseline with caregiver assistance   Status Achieved   PT SHORT TERM GOAL #2   Title Will assess BERG balance test and improve score by 4 points from baseline to indicate decreased fall risk.    Baseline Improved from 6/56 to 9/56.   Status Partially Met   PT SHORT TERM GOAL #3   Title Pt will increase gait speed to 1.2 ft/sec in order to indicate decreased fall risk and improved efficiency of gait.     Baseline 4/19: gait velocity = 1.21 ft/sec with RW   Status Achieved   PT SHORT TERM GOAL #4   Title Pt will verbalize understanding of fall prevention strategies to decrease fall risk at home.    Status Not Met   PT SHORT TERM GOAL #5   Title Pt will ambulate >200' over varying indoor surfaces with rollator at mod I level to indicate safety with AD/ambulation at home.     Status Not Met           PT Long Term Goals - 09/17/15 1328    PT LONG TERM GOAL #1   Title Pt will be independent (with caregiver) with HEP in order to indicate decreased fall risk and improved functional mobility.  (Target Date: 09/29/15)   Baseline 4/19: friend from church Deneise Lever) present today and unaware of HEP. Pt does not recall HEP.    Status Not Met   PT LONG TERM GOAL #2   Title Pt will improve BERG balance test score 6 points from baseline in order to indicate decreased fall risk.     Baseline Baseline Berg score = 6/56. Score on 4/19 = 9/56.   Status Not Met   PT LONG TERM GOAL #3   Title Pt will perform dynamic standing balance with single UE support x 5 mins at mod I level in order to increase independence with ADLs and functional mobility.     Baseline Pt tolerates < 1 minute of dynamic stnding balance with single UE support.     Status Not Met   PT LONG TERM GOAL #4   Title Pt will ambulate 300' over paved outdoor surfaces with rollator at S level in order to indicate safe community negotiation.     Baseline Met 4/19.   Status Achieved               Plan - 09/17/15 2127    Clinical Impression Statement Patient has met 2 of 5 STG's and 1 of 4 LTG's. Pt appears to have maximized stability/independence with functional mobility at this time and therefore will be discharged from outpatient PT at this time.  Pt unable to carry over information provided  during PT sessions. Pt progress in therapy was limited by inconsistent caregiver attendance/participation, despite PT recommendations. Pt and friend from church, Deneise Lever, were provided with information on community resources, including available adult fitness classes and infromation on [employer].  Educated [place] on how to safely assist patient onto/off of NuStep, should patient get membership at [name] or [employer]. Pt and caregiver verbalized understanding and were in full agreement with DC plan.    Consulted and Agree with Plan of Care Patient;Other (Comment)  Deneise Lever (friend from church)      Patient will benefit from skilled therapeutic intervention in order to improve the following deficits and impairments:     Visit Diagnosis: Unsteadiness on feet  Muscle weakness (generalized)  Other abnormalities of gait and mobility       G-Codes - Oct 08, 2015 2134    Functional Assessment Tool Used gait speed = 1.21 ft/sec   Functional Limitation Mobility: Walking and moving around   Mobility: Walking and Moving Around Goal Status [phone]) At least 40 percent but less than 60 percent impaired, limited or restricted   Mobility: Walking and Moving Around Discharge Status [phone]) At least 60 percent but less than 80 percent impaired, limited or restricted      Problem List Patient Active Problem List   Diagnosis Date Noted  . Rhabdomyolysis 01/13/2015   . HLD (hyperlipidemia) 01/13/2015  . Diabetes type 2, controlled ([place]) 01/13/2015  . Faintness   . CVA (cerebral infarction) 10/25/2011  . UTI (lower urinary tract infection) 10/25/2011  . Dehydration 10/25/2011  . Syncope 10/23/2011  . H/O: CVA (cerebrovascular accident) 10/23/2011  . Hypertension 10/23/2011  . Diabetes mellitus ([place]) 10/23/2011    PHYSICAL THERAPY DISCHARGE SUMMARY  Visits from Start of Care: 9  Current functional level related to goals / functional outcomes: See above goals and outcomes.   Remaining deficits: Gait impairments, decreased stability/independence with functional mobility; decreased functional endurance/activity tolerance; impaired standing balance; increased risk of falling   Education / Equipment: Fall prevention strategies in home environment; community resources for adult exercise classes and senior fitness centers; safety getting onto/off of NuStep  Plan: Patient agrees to discharge.  Patient goals were not met. Patient is being discharged due to lack of progress.  ?????        Billie Ruddy, PT, DPT [facility] [address] [place], [place], [address] Phone: [phone]   Fax:  [phone] 10/08/2015, 9:35 PM  Name: NHU GLASBY MRN: [id number] Date of Birth: 04-03-1941

## 2015-09-17 NOTE — Patient Instructions (Signed)

## 2015-09-19 ENCOUNTER — Ambulatory Visit: Payer: Commercial Managed Care - HMO | Admitting: Physical Therapy

## 2015-09-22 ENCOUNTER — Ambulatory Visit: Payer: Commercial Managed Care - HMO | Admitting: Physical Therapy

## 2015-09-24 ENCOUNTER — Ambulatory Visit: Payer: Commercial Managed Care - HMO | Admitting: Physical Therapy

## 2015-09-26 ENCOUNTER — Ambulatory Visit: Payer: Commercial Managed Care - HMO | Admitting: Physical Therapy

## 2015-09-26 NOTE — Addendum Note (Signed)
Addended by: Jorje Guild A on: 09/26/2015 05:17 PM   Modules accepted: Orders

## 2015-09-29 ENCOUNTER — Ambulatory Visit: Payer: Commercial Managed Care - HMO | Admitting: Physical Therapy

## 2015-10-01 ENCOUNTER — Ambulatory Visit: Payer: Commercial Managed Care - HMO | Admitting: Physical Therapy

## 2015-10-03 ENCOUNTER — Ambulatory Visit: Payer: Commercial Managed Care - HMO | Admitting: Physical Therapy

## 2015-10-08 ENCOUNTER — Ambulatory Visit: Payer: Commercial Managed Care - HMO | Admitting: Physical Therapy

## 2015-10-10 ENCOUNTER — Ambulatory Visit: Payer: Commercial Managed Care - HMO | Admitting: Physical Therapy

## 2015-10-13 DIAGNOSIS — Z1389 Encounter for screening for other disorder: Secondary | ICD-10-CM | POA: Diagnosis not present

## 2015-10-13 DIAGNOSIS — M859 Disorder of bone density and structure, unspecified: Secondary | ICD-10-CM | POA: Diagnosis not present

## 2015-10-13 DIAGNOSIS — M8589 Other specified disorders of bone density and structure, multiple sites: Secondary | ICD-10-CM | POA: Diagnosis not present

## 2015-10-13 DIAGNOSIS — Z0000 Encounter for general adult medical examination without abnormal findings: Secondary | ICD-10-CM | POA: Diagnosis not present

## 2015-10-15 ENCOUNTER — Encounter (HOSPITAL_COMMUNITY): Payer: Self-pay | Admitting: Emergency Medicine

## 2015-10-15 ENCOUNTER — Emergency Department (HOSPITAL_COMMUNITY)
Admission: EM | Admit: 2015-10-15 | Discharge: 2015-10-15 | Disposition: A | Discharge status: Home / Self Care | Payer: Commercial Managed Care - HMO | Attending: Emergency Medicine | Admitting: Emergency Medicine

## 2015-10-15 DIAGNOSIS — I10 Essential (primary) hypertension: Secondary | ICD-10-CM | POA: Insufficient documentation

## 2015-10-15 DIAGNOSIS — Z043 Encounter for examination and observation following other accident: Secondary | ICD-10-CM | POA: Diagnosis not present

## 2015-10-15 DIAGNOSIS — R404 Transient alteration of awareness: Secondary | ICD-10-CM | POA: Diagnosis not present

## 2015-10-15 DIAGNOSIS — E7800 Pure hypercholesterolemia, unspecified: Secondary | ICD-10-CM | POA: Insufficient documentation

## 2015-10-15 DIAGNOSIS — E119 Type 2 diabetes mellitus without complications: Secondary | ICD-10-CM | POA: Insufficient documentation

## 2015-10-15 DIAGNOSIS — R531 Weakness: Secondary | ICD-10-CM | POA: Diagnosis not present

## 2015-10-15 DIAGNOSIS — W19XXXA Unspecified fall, initial encounter: Secondary | ICD-10-CM

## 2015-10-15 DIAGNOSIS — Z048 Encounter for examination and observation for other specified reasons: Secondary | ICD-10-CM | POA: Diagnosis not present

## 2015-10-15 NOTE — ED Notes (Signed)
MD at bedside. 

## 2015-10-15 NOTE — ED Provider Notes (Signed)
CSN: [id number]     Arrival date & time 10/15/15  1704 History   First MD Initiated Contact with Patient 10/15/15 1740     Chief Complaint  Patient presents with  . Fall      Patient is a 75 y.o. female presenting with fall.  Fall Pertinent negatives include no chest pain, no abdominal pain and no shortness of breath.  Patient presented after a fall. States she slid off her bed onto the floor. Denies any pain. States she feels fine. No loss conscious. She is not on anticoagulation. Denies chest pain neck pain or abdominal pain. Denies urinary frequency but states she does urinate constantly.  Past Medical History  Diagnosis Date  . Stroke ([facility])   . Hypertension   . Hypercholesteremia   . Diabetes mellitus    Past Surgical History  Procedure Laterality Date  . Tooth extraction     Family History  Problem Relation Age of Onset  . Diabetes type II    . Hypertension     Social History  Substance Use Topics  . Smoking status: Never Smoker   . Smokeless tobacco: Never Used  . Alcohol Use: No   OB History    No data available     Review of Systems  Constitutional: Negative for appetite change.  Respiratory: Negative for shortness of breath.   Cardiovascular: Negative for chest pain.  Gastrointestinal: Negative for abdominal pain.  Genitourinary: Negative for frequency.  Musculoskeletal: Negative for back pain.  Skin: Negative for wound.  Neurological: Negative for syncope.  Hematological: Negative for adenopathy.  Psychiatric/Behavioral: Negative for confusion.      Allergies  Review of patient's allergies indicates no known allergies.  Home Medications   Prior to Admission medications   Medication Sig Start Date End Date Taking? Authorizing Provider  atorvastatin (LIPITOR) 20 MG tablet Take 20 mg by mouth daily. For high cholesterol    Historical Provider, MD  bisacodyl (DULCOLAX) 5 MG EC tablet Take 5 mg by mouth daily as needed. For constipation     Historical Provider, MD  dipyridamole-aspirin (AGGRENOX) 25-200 MG per 12 hr capsule Take 1 capsule by mouth 2 (two) times daily.    Historical Provider, MD  guaifenesin (ROBITUSSIN) 100 MG/5ML syrup Reported on 08/04/2015    Historical Provider, MD  hydrALAZINE (APRESOLINE) 50 MG tablet Take 1 tablet (50 mg total) by mouth every 8 (eight) hours. 01/16/15   Joseph Art, DO  latanoprost (XALATAN) 0.005 % ophthalmic solution Place 1 drop into both eyes at bedtime as needed. Reported on 08/04/2015 12/07/14   Historical Provider, MD  levETIRAcetam (KEPPRA) 500 MG tablet Take 1 tablet (500 mg total) by mouth 2 (two) times daily. 01/16/15   Joseph Art, DO  lisinopril (PRINIVIL,ZESTRIL) 20 MG tablet Take 20 mg by mouth 2 (two) times daily.    Historical Provider, MD  polyethylene glycol (MIRALAX / GLYCOLAX) packet Take 17 g by mouth daily. Reported on 08/04/2015    Historical Provider, MD  sennosides-docusate sodium (SENOKOT-S) 8.6-50 MG tablet Take 2 tablets by mouth at bedtime. Reported on 08/04/2015    Historical Provider, MD   BP 180/51 mmHg  Pulse 62  Temp(Src) 98.2 F (36.8 C) (Oral)  Resp 18  SpO2 96% Physical Exam  Constitutional: She appears well-developed.  HENT:  Head: Atraumatic.  Eyes: EOM are normal.  Neck: Neck supple.  Cardiovascular: Normal rate.   Pulmonary/Chest: Effort normal.  Abdominal: Soft.  Genitourinary:  Patient does smell of urine  Musculoskeletal: She exhibits no edema or tenderness.  Neurological: She is alert.  Patient is awake and appropriate.    ED Course  Procedures (including critical care time) Labs Review Labs Reviewed - No data to display  Imaging Review No results found. I have personally reviewed and evaluated these images and lab results as part of my medical decision-making.   EKG Interpretation None      MDM   Final diagnoses:  Fall, initial encounter    Patient with fall. No complaints. No apparent injury. Will discharge  back.    Benjiman Core, MD 10/15/15 [phone]

## 2015-10-15 NOTE — ED Notes (Signed)
Per EMS, pt from home, slid out of bed. Denies LOC and pain. Pt has slurred speech at baseline, hx of stroke.

## 2015-10-15 NOTE — ED Notes (Signed)
Bed: [facility] Expected date:  Expected time:  Means of arrival:  Comments: EMS-fall 

## 2015-12-09 DIAGNOSIS — I10 Essential (primary) hypertension: Secondary | ICD-10-CM | POA: Diagnosis not present

## 2015-12-09 DIAGNOSIS — E7800 Pure hypercholesterolemia, unspecified: Secondary | ICD-10-CM | POA: Diagnosis not present

## 2015-12-09 DIAGNOSIS — I639 Cerebral infarction, unspecified: Secondary | ICD-10-CM | POA: Diagnosis not present

## 2015-12-09 DIAGNOSIS — M6289 Other specified disorders of muscle: Secondary | ICD-10-CM | POA: Diagnosis not present

## 2015-12-09 DIAGNOSIS — E113299 Type 2 diabetes mellitus with mild nonproliferative diabetic retinopathy without macular edema, unspecified eye: Secondary | ICD-10-CM | POA: Diagnosis not present

## 2015-12-09 DIAGNOSIS — R569 Unspecified convulsions: Secondary | ICD-10-CM | POA: Diagnosis not present

## 2015-12-10 ENCOUNTER — Ambulatory Visit (INDEPENDENT_AMBULATORY_CARE_PROVIDER_SITE_OTHER): Payer: Commercial Managed Care - HMO | Admitting: Podiatry

## 2015-12-10 ENCOUNTER — Encounter: Payer: Self-pay | Admitting: Podiatry

## 2015-12-10 DIAGNOSIS — B351 Tinea unguium: Secondary | ICD-10-CM

## 2015-12-10 DIAGNOSIS — M79676 Pain in unspecified toe(s): Secondary | ICD-10-CM

## 2015-12-10 NOTE — Patient Instructions (Signed)
Diabetes and Foot Care Diabetes may cause you to have problems because of poor blood supply (circulation) to your feet and legs. This may cause the skin on your feet to become thinner, break easier, and heal more slowly. Your skin may become dry, and the skin may peel and crack. You may also have nerve damage in your legs and feet causing decreased feeling in them. You may not notice minor injuries to your feet that could lead to infections or more serious problems. Taking care of your feet is one of the most important things you can do for yourself.  HOME CARE INSTRUCTIONS  Wear shoes at all times, even in the house. Do not go barefoot. Bare feet are easily injured.  Check your feet daily for blisters, cuts, and redness. If you cannot see the bottom of your feet, use a mirror or ask someone for help.  Wash your feet with warm water (do not use hot water) and mild soap. Then pat your feet and the areas between your toes until they are completely dry. Do not soak your feet as this can dry your skin.  Apply a moisturizing lotion or petroleum jelly (that does not contain alcohol and is unscented) to the skin on your feet and to dry, brittle toenails. Do not apply lotion between your toes.  Trim your toenails straight across. Do not dig under them or around the cuticle. File the edges of your nails with an emery board or nail file.  Do not cut corns or calluses or try to remove them with medicine.  Wear clean socks or stockings every day. Make sure they are not too tight. Do not wear knee-high stockings since they may decrease blood flow to your legs.  Wear shoes that fit properly and have enough cushioning. To break in new shoes, wear them for just a few hours a day. This prevents you from injuring your feet. Always look in your shoes before you put them on to be sure there are no objects inside.  Do not cross your legs. This may decrease the blood flow to your feet.  If you find a minor scrape,  cut, or break in the skin on your feet, keep it and the skin around it clean and dry. These areas may be cleansed with mild soap and water. Do not cleanse the area with peroxide, alcohol, or iodine.  When you remove an adhesive bandage, be sure not to damage the skin around it.  If you have a wound, look at it several times a day to make sure it is healing.  Do not use heating pads or hot water bottles. They may burn your skin. If you have lost feeling in your feet or legs, you may not know it is happening until it is too late.  Make sure your health care provider performs a complete foot exam at least annually or more often if you have foot problems. Report any cuts, sores, or bruises to your health care provider immediately. SEEK MEDICAL CARE IF:   You have an injury that is not healing.  You have cuts or breaks in the skin.  You have an ingrown nail.  You notice redness on your legs or feet.  You feel burning or tingling in your legs or feet.  You have pain or cramps in your legs and feet.  Your legs or feet are numb.  Your feet always feel cold. SEEK IMMEDIATE MEDICAL CARE IF:   There is increasing redness,   swelling, or pain in or around a wound.  There is a red line that goes up your leg.  Pus is coming from a wound.  You develop a fever or as directed by your health care provider.  You notice a bad smell coming from an ulcer or wound.   This information is not intended to replace advice given to you by your health care provider. Make sure you discuss any questions you have with your health care provider.   Document Released: 05/14/2000 Document Revised: 01/17/2013 Document Reviewed: 10/24/2012 Elsevier Interactive Patient Education 2016 [employer]vier Inc[employer]  

## 2015-12-11 NOTE — Progress Notes (Signed)
Patient ID: [name], female   DOB: 1941-05-13, 75 y.o.   MRN: [id number]   Subjective: This patient presents today complaining of thickened and elongated toenails and walking wearing shoes and requests toenail debridement. Last visit for a similar service was 09/02/2015. Patient denies history of foot ulceration, claudication, amputation Patient's caregiver present to treatment room  Vascular: DP 2/4 bilaterally PT pulse 1/4 laterally Capillary reflex immediate bilaterally  Neurological: Sensation to 10 g monofilament wire 5/5 bilaterally Vibratory sensation nonreactive bilaterally Ankle reflex equal and reactive bilaterally  Dermatological: No open skin lesions bilaterally The toenails 6-10 are extremely elongated, incurvated, deformed, discolored and tender to direct palpation 6-10  Musculoskeletal: Patient walks with roller walker Pes planus bilaterally HAV right  Assessment: Decreased posterior tibial pulses bilaterally Protective sensation intact bilaterally 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

## 2016-04-14 DIAGNOSIS — I10 Essential (primary) hypertension: Secondary | ICD-10-CM | POA: Diagnosis not present

## 2016-04-14 DIAGNOSIS — E113299 Type 2 diabetes mellitus with mild nonproliferative diabetic retinopathy without macular edema, unspecified eye: Secondary | ICD-10-CM | POA: Diagnosis not present

## 2016-04-14 DIAGNOSIS — E1165 Type 2 diabetes mellitus with hyperglycemia: Secondary | ICD-10-CM | POA: Diagnosis not present

## 2016-04-14 DIAGNOSIS — N182 Chronic kidney disease, stage 2 (mild): Secondary | ICD-10-CM | POA: Diagnosis not present

## 2016-04-14 DIAGNOSIS — R569 Unspecified convulsions: Secondary | ICD-10-CM | POA: Diagnosis not present

## 2016-04-14 DIAGNOSIS — Z23 Encounter for immunization: Secondary | ICD-10-CM | POA: Diagnosis not present

## 2016-04-14 DIAGNOSIS — E7800 Pure hypercholesterolemia, unspecified: Secondary | ICD-10-CM | POA: Diagnosis not present

## 2016-04-14 DIAGNOSIS — R269 Unspecified abnormalities of gait and mobility: Secondary | ICD-10-CM | POA: Diagnosis not present

## 2016-04-14 DIAGNOSIS — G8191 Hemiplegia, unspecified affecting right dominant side: Secondary | ICD-10-CM | POA: Diagnosis not present

## 2016-04-14 DIAGNOSIS — I639 Cerebral infarction, unspecified: Secondary | ICD-10-CM | POA: Diagnosis not present

## 2016-05-03 DIAGNOSIS — H401134 Primary open-angle glaucoma, bilateral, indeterminate stage: Secondary | ICD-10-CM | POA: Diagnosis not present

## 2016-05-03 DIAGNOSIS — H401124 Primary open-angle glaucoma, left eye, indeterminate stage: Secondary | ICD-10-CM | POA: Diagnosis not present

## 2016-05-03 DIAGNOSIS — H401114 Primary open-angle glaucoma, right eye, indeterminate stage: Secondary | ICD-10-CM | POA: Diagnosis not present

## 2016-05-19 DIAGNOSIS — E113291 Type 2 diabetes mellitus with mild nonproliferative diabetic retinopathy without macular edema, right eye: Secondary | ICD-10-CM | POA: Diagnosis not present

## 2016-05-19 DIAGNOSIS — E113292 Type 2 diabetes mellitus with mild nonproliferative diabetic retinopathy without macular edema, left eye: Secondary | ICD-10-CM | POA: Diagnosis not present

## 2016-05-19 DIAGNOSIS — E113293 Type 2 diabetes mellitus with mild nonproliferative diabetic retinopathy without macular edema, bilateral: Secondary | ICD-10-CM | POA: Diagnosis not present

## 2016-06-23 DIAGNOSIS — H401114 Primary open-angle glaucoma, right eye, indeterminate stage: Secondary | ICD-10-CM | POA: Diagnosis not present

## 2016-07-02 DIAGNOSIS — R600 Localized edema: Secondary | ICD-10-CM | POA: Diagnosis not present

## 2016-07-07 ENCOUNTER — Ambulatory Visit (INDEPENDENT_AMBULATORY_CARE_PROVIDER_SITE_OTHER): Payer: Medicare HMO | Admitting: Podiatry

## 2016-07-07 ENCOUNTER — Encounter: Payer: Self-pay | Admitting: Podiatry

## 2016-07-07 VITALS — BP 156/80 | HR 62 | Resp 18

## 2016-07-07 DIAGNOSIS — B351 Tinea unguium: Secondary | ICD-10-CM

## 2016-07-07 DIAGNOSIS — E1151 Type 2 diabetes mellitus with diabetic peripheral angiopathy without gangrene: Secondary | ICD-10-CM

## 2016-07-07 DIAGNOSIS — M79676 Pain in unspecified toe(s): Secondary | ICD-10-CM | POA: Diagnosis not present

## 2016-07-07 NOTE — Patient Instructions (Signed)

## 2016-07-07 NOTE — Progress Notes (Signed)
   Subjective:    Patient ID: [name], female    DOB: Apr 01, 1941, 76 y.o.   MRN: [id number]  HPI   I am here to get my toenails trimmed and my feet do swell and I have some toenails that are curling in     Review of Systems  All other systems reviewed and are negative.      Objective:   Physical Exam        Assessment & Plan:

## 2016-07-08 ENCOUNTER — Other Ambulatory Visit: Payer: Self-pay

## 2016-07-08 ENCOUNTER — Other Ambulatory Visit: Payer: Self-pay | Admitting: Nurse Practitioner

## 2016-07-08 ENCOUNTER — Ambulatory Visit (HOSPITAL_COMMUNITY): Payer: Medicare HMO | Attending: Cardiology

## 2016-07-08 DIAGNOSIS — I501 Left ventricular failure: Secondary | ICD-10-CM | POA: Insufficient documentation

## 2016-07-08 DIAGNOSIS — I348 Other nonrheumatic mitral valve disorders: Secondary | ICD-10-CM | POA: Insufficient documentation

## 2016-07-08 DIAGNOSIS — R600 Localized edema: Secondary | ICD-10-CM | POA: Diagnosis not present

## 2016-07-08 LAB — ECHOCARDIOGRAM COMPLETE
AOPV: 0.55 m/s
AOVTI: 46.3 cm
AV Area VTI index: 0.98 cm2/m2
AV Area VTI: 1.4 cm2
AV Mean grad: 8 mmHg
AV Peak grad: 14 mmHg
AV pk vel: 185 cm/s
AV vel: 1.62
AVAREAMEANV: 1.48 cm2
AVAREAMEANVIN: 0.9 cm2/m2
AVCELMEANRAT: 0.58
CHL CUP AV PEAK INDEX: 0.85
CHL CUP DOP CALC LVOT VTI: 29.6 cm
CHL CUP MV DEC (S): 254
DOP CAL AO MEAN VELOCITY: 131 cm/s
E decel time: 254 msec
E/e' ratio: 14.36
FS: 38 % (ref 28–44)
IV/PV OW: 0.82
LA ID, A-P, ES: 29 mm
LA diam end sys: 29 mm
LADIAMINDEX: 1.76 cm/m2
LAVOLA4C: 23 mL
LV E/e' medial: 14.36
LV E/e'average: 14.36
LV PW d: 10.8 mm — AB (ref 0.6–1.1)
LVELAT: 6.91 cm/s
LVOT SV: 75 mL
LVOT area: 2.54 cm2
LVOT diameter: 18 mm
LVOT peak VTI: 0.64 cm
LVOT peak grad rest: 4 mmHg
LVOTPV: 102 cm/s
Lateral S' vel: 7.9 cm/s
MV Peak grad: 4 mmHg
MVPKAVEL: 110 m/s
MVPKEVEL: 99.2 m/s
TDI e' lateral: 6.91
TDI e' medial: 6.58
Valve area index: 0.98
Valve area: 1.62 cm2

## 2016-07-08 NOTE — Progress Notes (Signed)
Patient ID: [name], female   DOB: [birth year], 76 y.o.   MRN: [id number]    Subjective: This patient presents today complaining of thickened and elongated toenails and walking wearing shoes and requests toenail debridement. Last visit for a similar service was 09/02/2015. Patient denies history of foot ulceration, claudication, amputation Patient's caregiver present to treatment room  Vascular: DP 2/4 bilaterally PT pulse 1/4 laterally Capillary reflex immediate bilaterally  Neurological: Sensation to 10 g monofilament wire 5/5 bilaterally Vibratory sensation nonreactive bilaterally Ankle reflex equal and reactive bilaterally  Dermatological: No open skin lesions bilaterally The toenails 6-10 are extremely elongated, incurvated, deformed, discolored and tender to direct palpation 6-10  Musculoskeletal: Patient walks with roller walker Pes planus bilaterally HAV right  Assessment: Decreased posterior tibial pulses bilaterally 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

## 2016-08-06 IMAGING — CT CT HEAD W/O CM
1 series · 16 of 29 positions shown, 20 images · non-contrast
Comparison: 11/16/2014

CLINICAL DATA: Unresponsive

EXAM:
CT HEAD WITHOUT CONTRAST
TECHNIQUE: Contiguous axial images were obtained from the base of the skull
through the vertex without intravenous contrast.

[Series 2: head 5.0 h30s · axial · 0.41mm/px · z∈[-87,+43]mm · 16 of 29 slices shown, 20 images]
[im 2/29  brain]
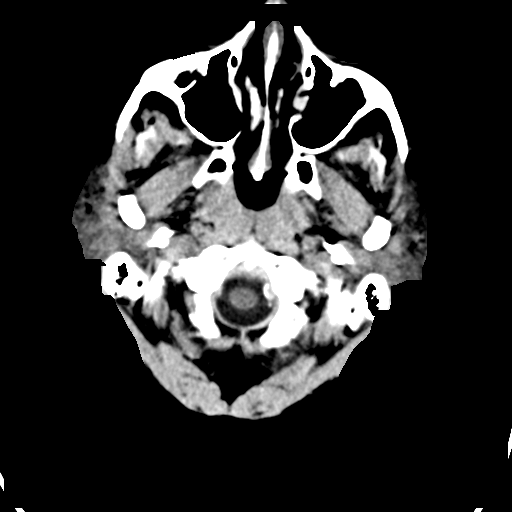
[im 2/29  bone]
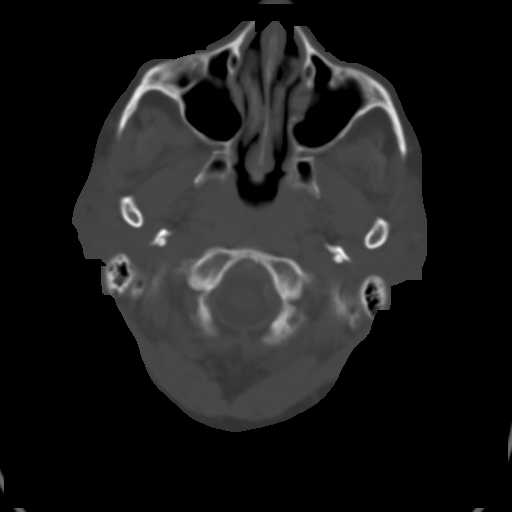
[im 4/29  brain]
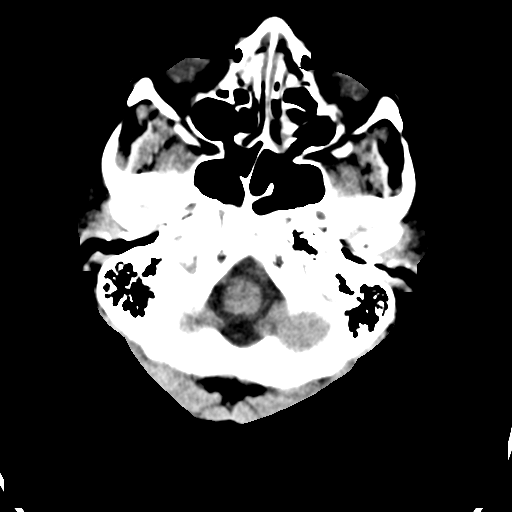
[im 6/29  brain]
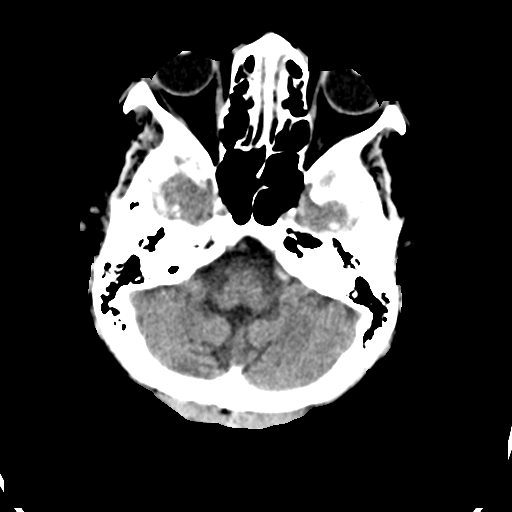
[im 7/29  brain]
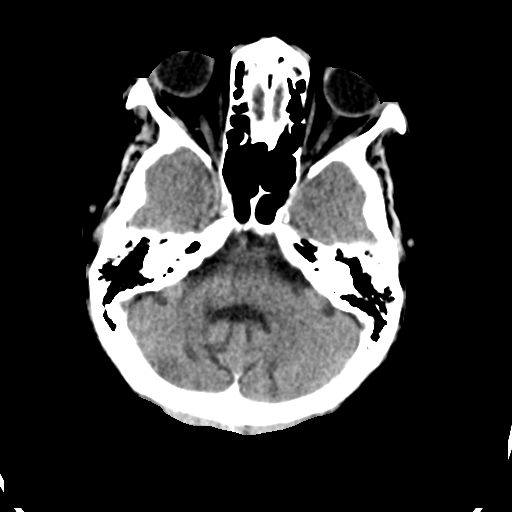
[im 9/29  brain]
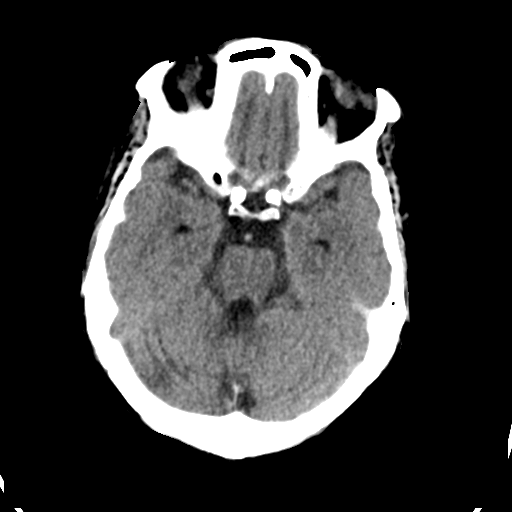
[im 9/29  bone]
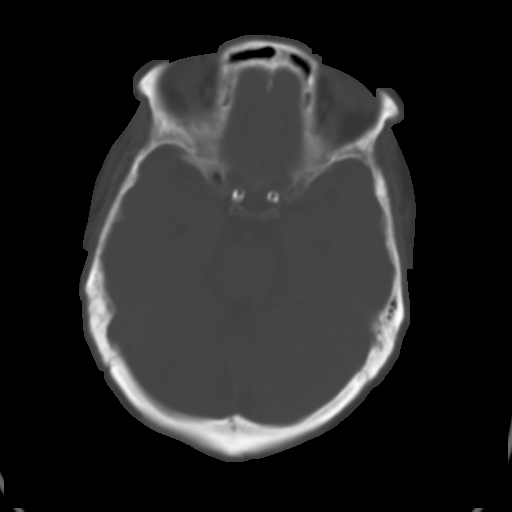
[im 11/29  brain]
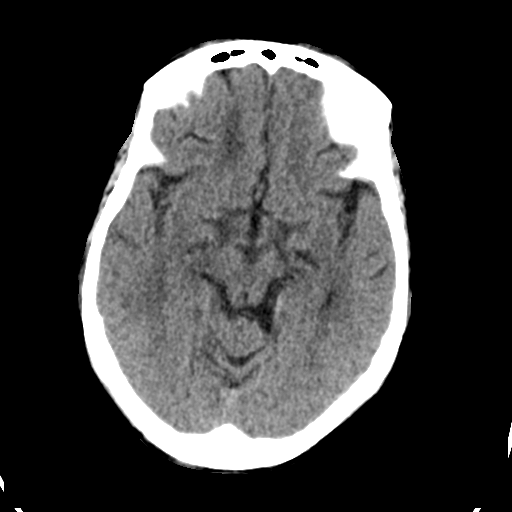
[im 12/29  brain]
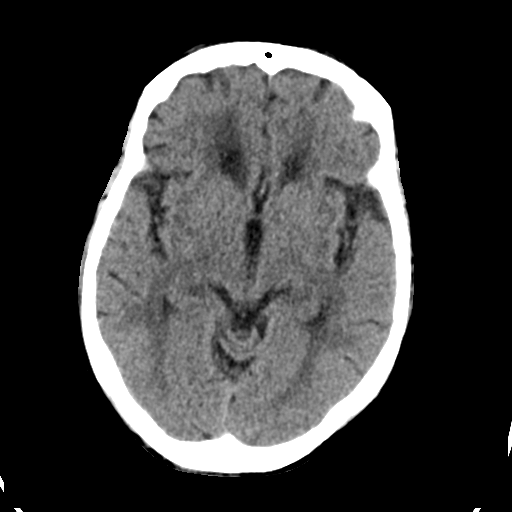
[im 14/29  brain]
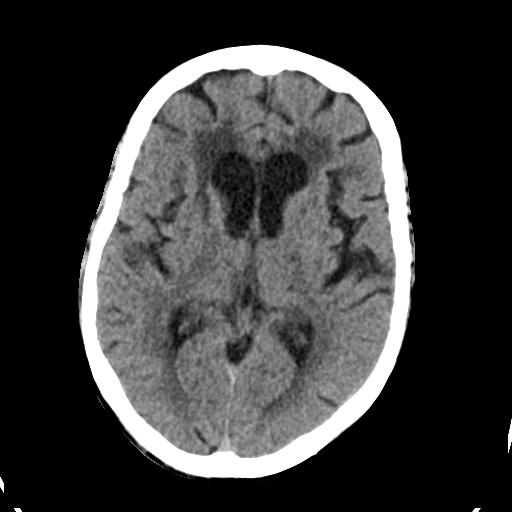
[im 16/29  brain]
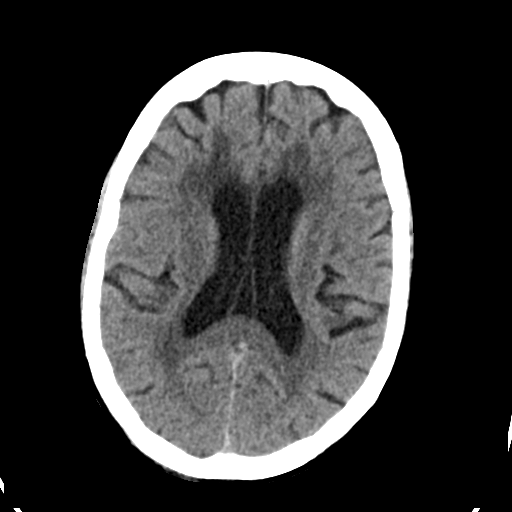
[im 16/29  bone]
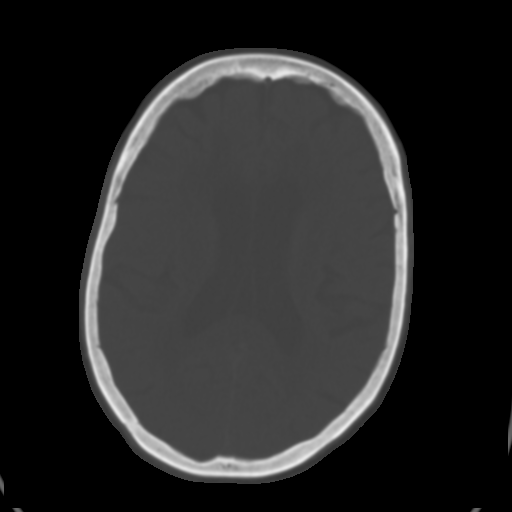
[im 18/29  brain]
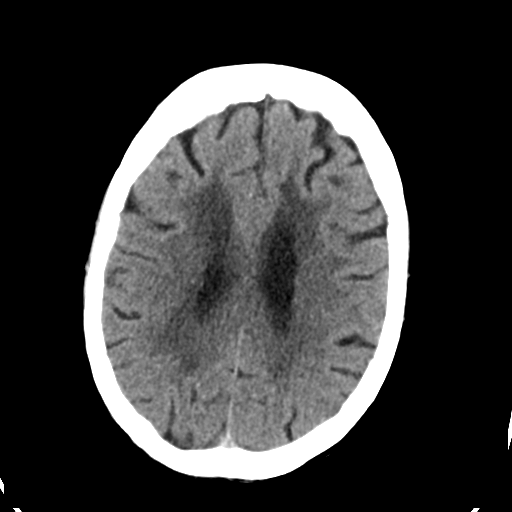
[im 19/29  brain]
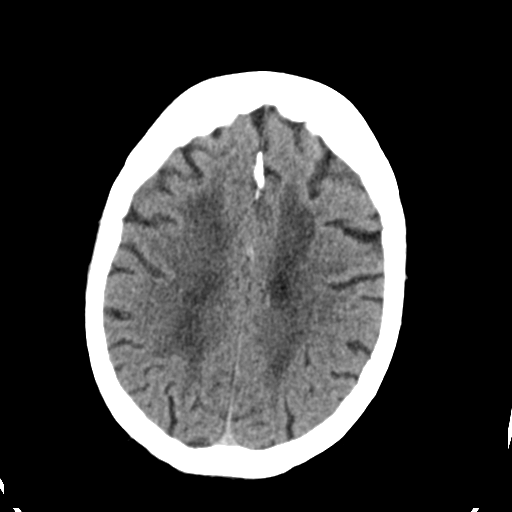
[im 21/29  brain]
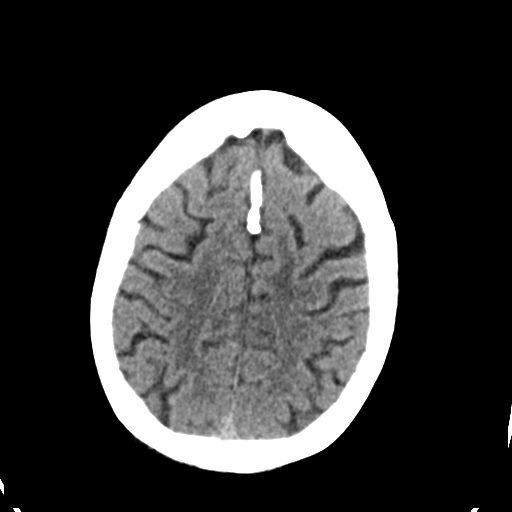
[im 23/29  brain]
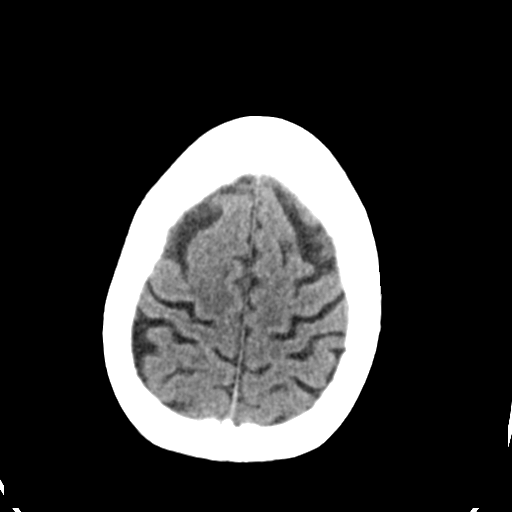
[im 23/29  bone]
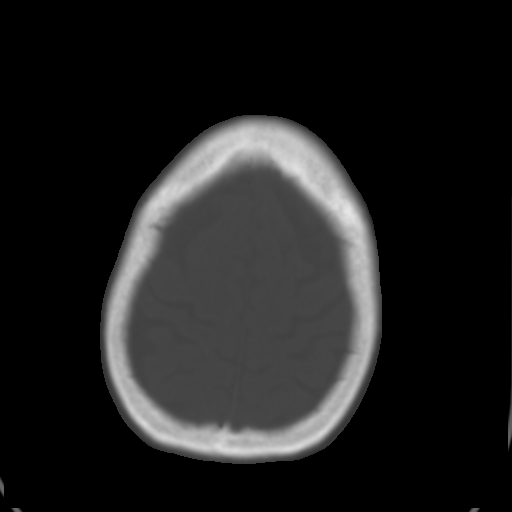
[im 24/29  brain]
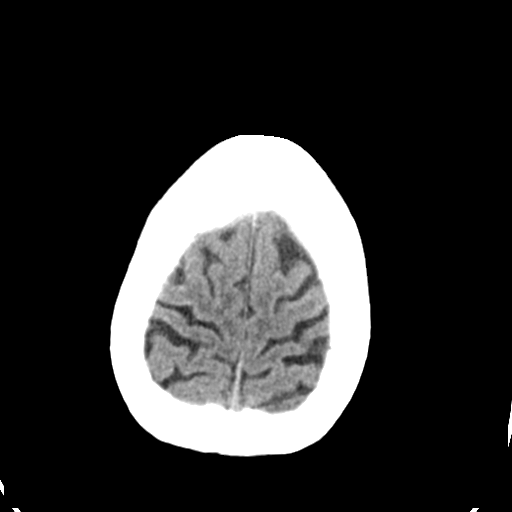
[im 26/29  brain]
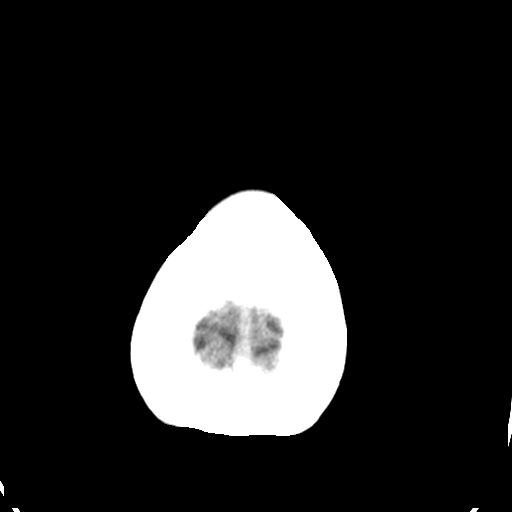
[im 28/29  brain]
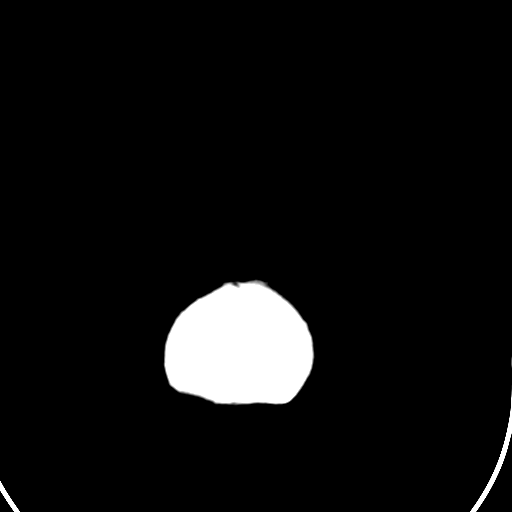

[16 of 29 positions shown; findings below may reference images not displayed]

FINDINGS: There is no evidence of mass effect, midline shift, or extra-axial
fluid collections. There is no evidence of a space-occupying lesion
or intracranial hemorrhage. There is no evidence of a cortical-based
area of acute infarction. There is generalized cerebral atrophy.
There is periventricular white matter low attenuation likely
secondary to microangiopathy.

The ventricles and sulci are appropriate for the patient's age. The
basal cisterns are patent.

Visualized portions of the orbits are unremarkable. The visualized
portions of the paranasal sinuses and mastoid air cells are
unremarkable. Cerebrovascular atherosclerotic calcifications are
noted.

The osseous structures are unremarkable.
IMPRESSION: 1. No acute intracranial pathology.
2. Chronic microvascular disease and cerebral atrophy.

## 2016-08-07 IMAGING — MR MR HEAD W/O CM
9 of 10 series · 34 of 48 positions shown · non-contrast
Comparison: Head CT without contrast 01/13/2015. Brain MRI
11/16/2014, and earlier

CLINICAL DATA: 73-year-old female with acute onset lower extremity
weakness, found on floor this morning. Initial encounter.

EXAM:
MRI HEAD WITHOUT CONTRAST
TECHNIQUE: Multiplanar, multiecho pulse sequences of the brain and surrounding
structures were obtained without intravenous contrast.

[Series 3: DWI · axial · 3.0mm · 0.94mm/px · z∈[-76,+49]mm · 8 of 86 slices shown (1 of 4)]
[im 1/86]
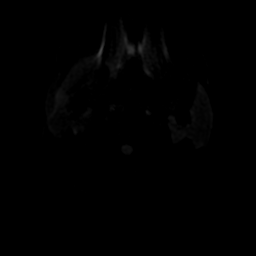
[im 10/86]
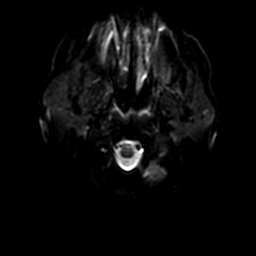
[im 29/86]
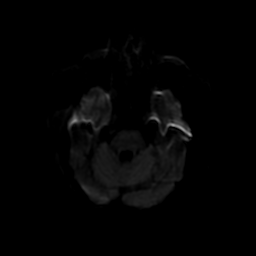
[im 38/86]
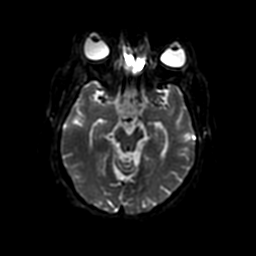
[im 48/86]
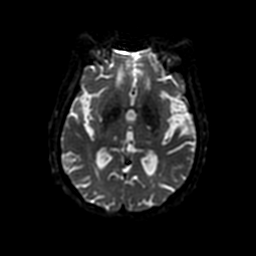
[im 57/86]
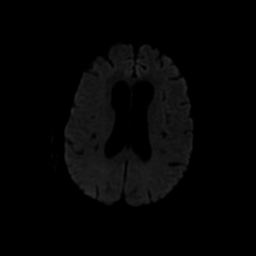
[im 76/86]
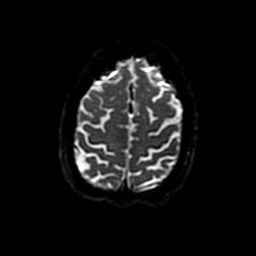
[im 86/86]
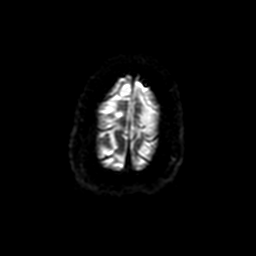

[Series 4: FLAIR · sagittal · 5.0mm · 0.47mm/px · 2 of 23 slices shown (1 of 2)]
[im 1/23]
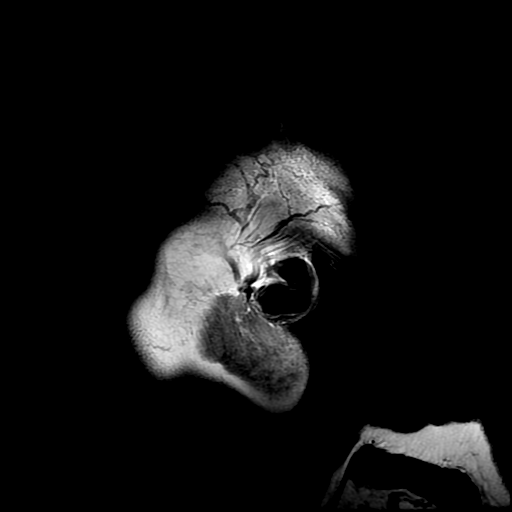
[im 23/23]
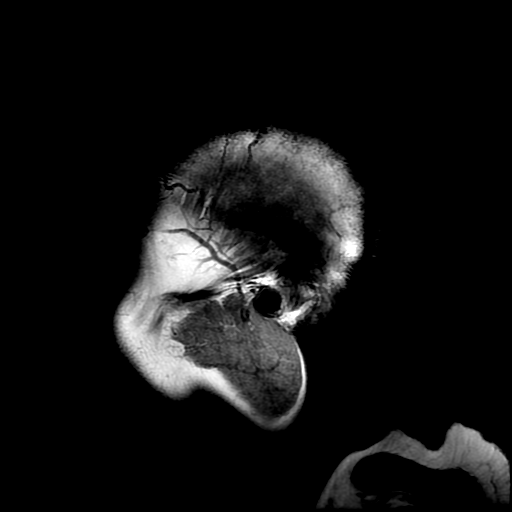

[Series 5: T2 · axial · 5.0mm · 0.47mm/px · z∈[-76,+49]mm · 2 of 22 slices shown (1 of 2)]
[im 1/22]
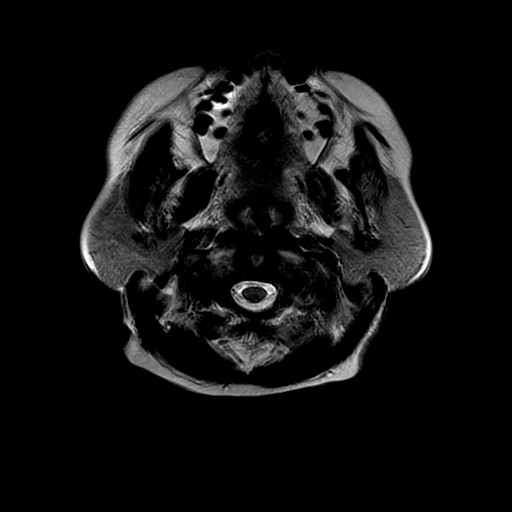
[im 22/22]
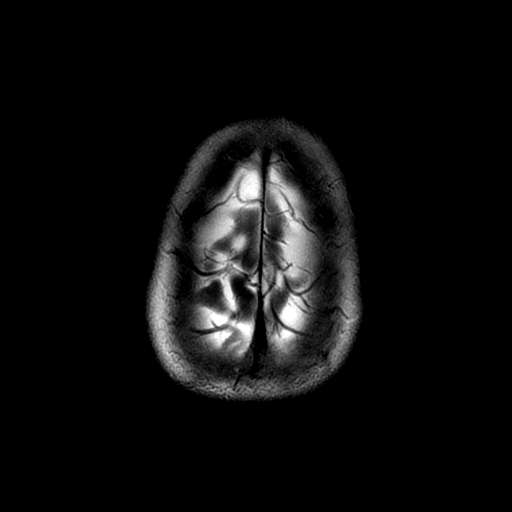

[Series 6: FLAIR · axial · 5.0mm · 0.47mm/px · z∈[-76,+49]mm · 2 of 22 slices shown (2 of 2)]
[im 1/22]
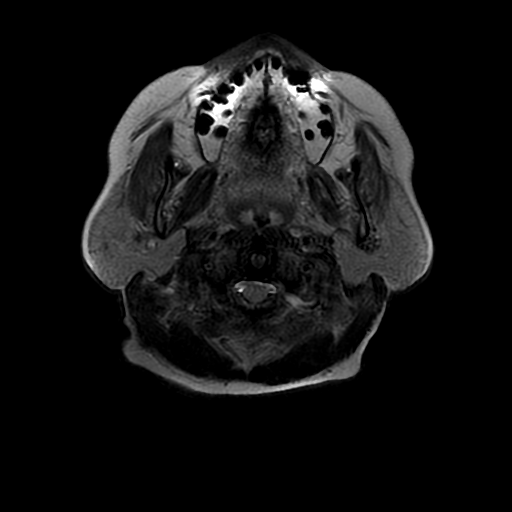
[im 22/22]
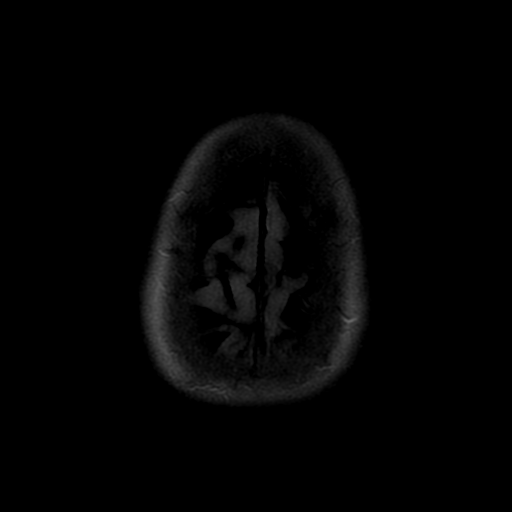

[Series 7: DWI · coronal · 5.0mm · 0.94mm/px · 6 of 64 slices shown (2 of 4)]
[im 1/64]
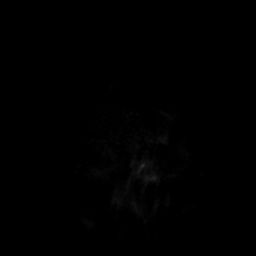
[im 13/64]
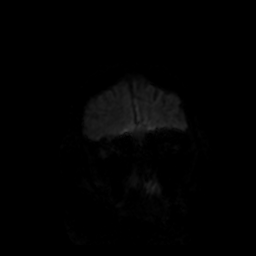
[im 26/64]
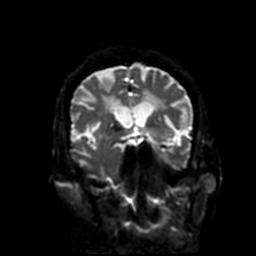
[im 38/64]
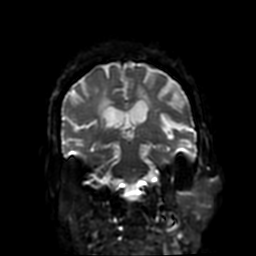
[im 51/64]
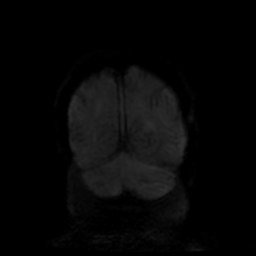
[im 64/64]
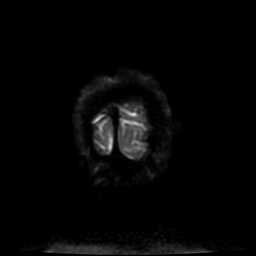

[Series 8: (person_name) · axial · 3.0mm · 0.47mm/px · z∈[-78,-30]mm · 4 of 88 slices shown]
[im 1/88]
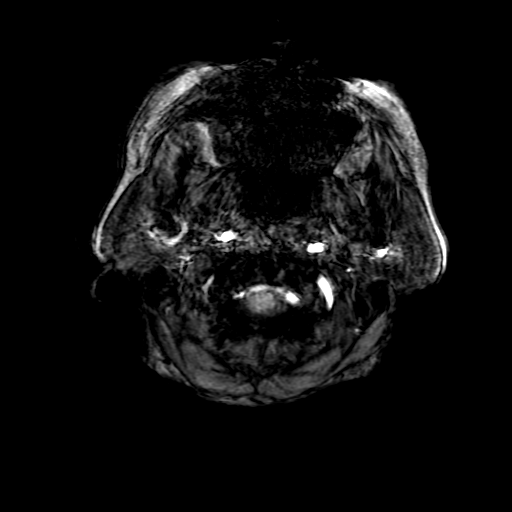
[im 11/88]
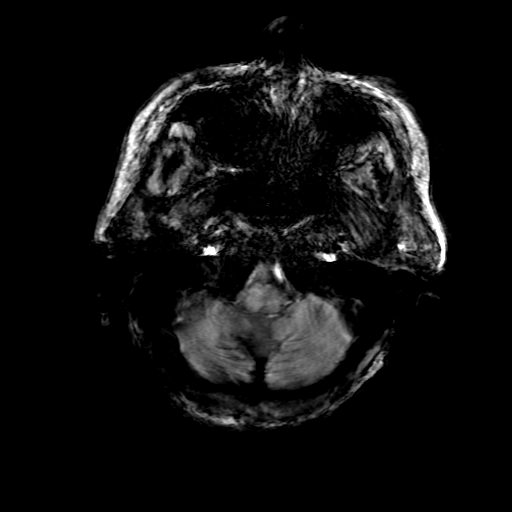
[im 22/88]
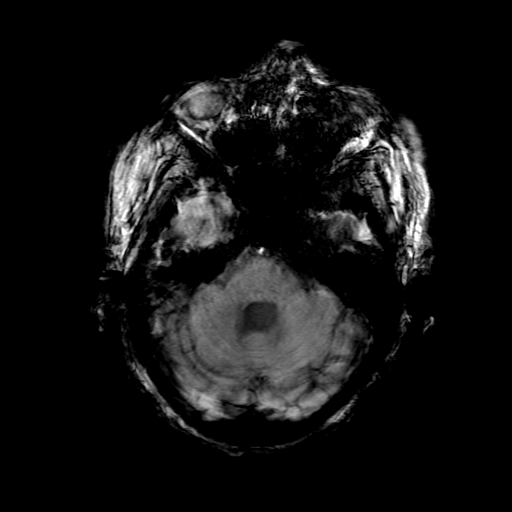
[im 33/88]
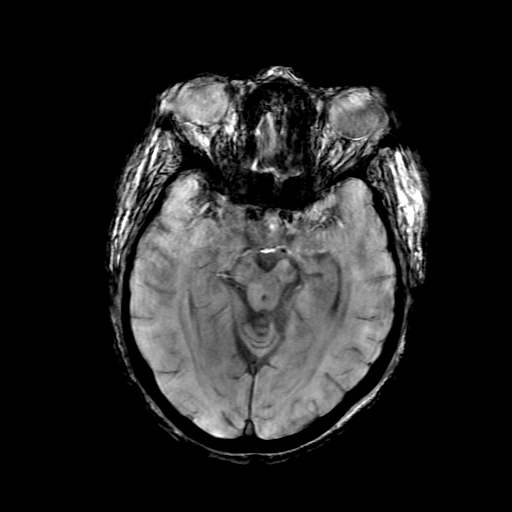

[Series 10: T2 · coronal · 5.0mm · 0.39mm/px · 3 of 27 slices shown (2 of 2)]
[im 1/27]
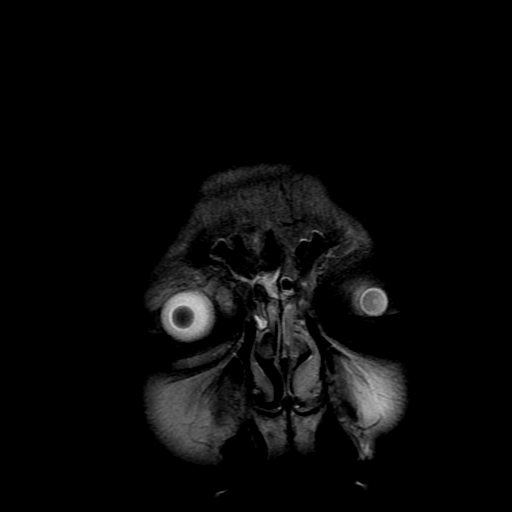
[im 14/27]
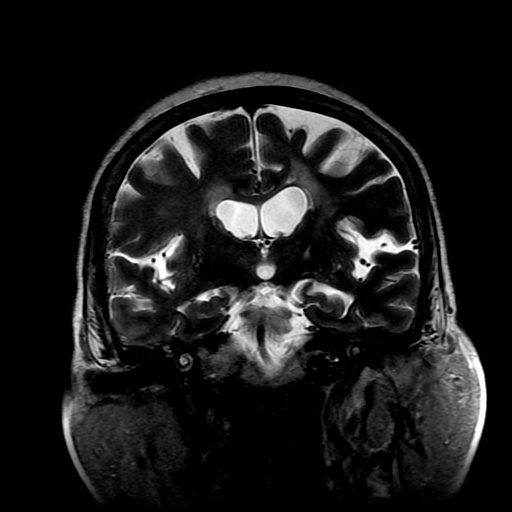
[im 27/27]
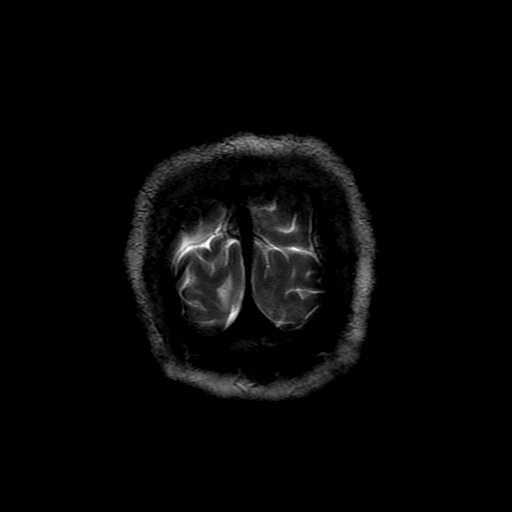

[Series 300: DWI · axial · 3.0mm · 0.94mm/px · z∈[-76,+49]mm · 4 of 43 slices shown (3 of 4)]
[im 1/43]
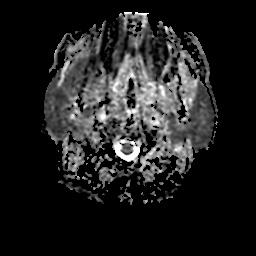
[im 15/43]
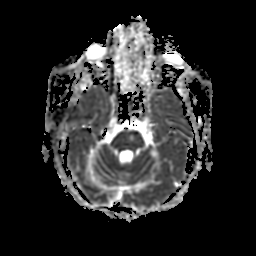
[im 29/43]
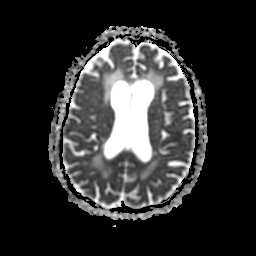
[im 43/43]
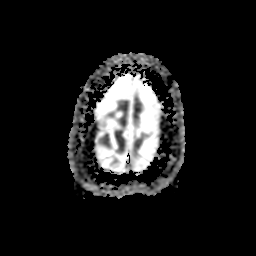

[Series 700: DWI · coronal · 5.0mm · 0.94mm/px · 3 of 32 slices shown (4 of 4)]
[im 1/32]
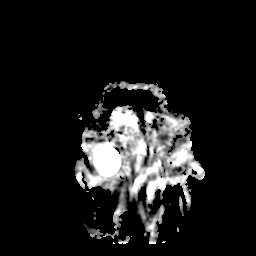
[im 16/32]
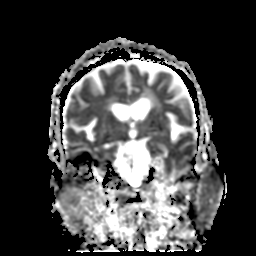
[im 32/32]
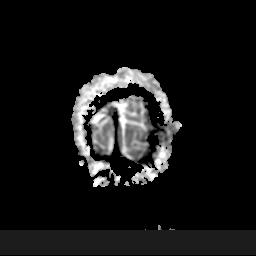

[34 of 48 positions shown; findings below may reference images not displayed]

FINDINGS: No restricted diffusion or evidence of acute infarction. Major
intracranial vascular flow voids are stable.

Advanced and widespread chronic small vessel ischemic changes re-
demonstrated, including lacunar infarcts in the bilateral deep gray
matter nuclei, left paracentral pons, both cerebellar hemispheres.
Confluent bilateral cerebral white matter T2 and FLAIR
hyperintensity. Gray and white matter signal is stable since [REDACTED].
Scattered hemosiderin deposition re- demonstrated.

Trace mastoid fluid is new. Negative nasopharynx. Increased
paranasal sinus mucosal thickening. Orbit and scalp soft tissues are
stable. Normal bone marrow signal. Stable visualized cervical spine.
IMPRESSION: 1.  No acute intracranial abnormality.
2. Chronically advanced small vessel ischemia/small vessel disease.

## 2016-08-13 DIAGNOSIS — H401134 Primary open-angle glaucoma, bilateral, indeterminate stage: Secondary | ICD-10-CM | POA: Diagnosis not present

## 2016-08-18 DIAGNOSIS — H401124 Primary open-angle glaucoma, left eye, indeterminate stage: Secondary | ICD-10-CM | POA: Diagnosis not present

## 2016-10-06 ENCOUNTER — Encounter: Payer: Self-pay | Admitting: Podiatry

## 2016-10-06 ENCOUNTER — Ambulatory Visit (INDEPENDENT_AMBULATORY_CARE_PROVIDER_SITE_OTHER): Payer: Medicare HMO | Admitting: Podiatry

## 2016-10-06 DIAGNOSIS — M79676 Pain in unspecified toe(s): Secondary | ICD-10-CM

## 2016-10-06 DIAGNOSIS — E1151 Type 2 diabetes mellitus with diabetic peripheral angiopathy without gangrene: Secondary | ICD-10-CM

## 2016-10-06 DIAGNOSIS — B351 Tinea unguium: Secondary | ICD-10-CM

## 2016-10-06 NOTE — Progress Notes (Signed)
Patient ID: [name], female   DOB: January 13, 1941, 76 y.o.   MRN: [id number]   Subjective: This patient presents today complaining of thickened and elongated toenails and walking wearing shoes and requests toenail debridement. Last visit for a similar service was 09/02/2015. Patient denies history of foot ulceration, claudication, amputation Patient's daughter is present in the treatment room  Vascular: DP 2/4 bilaterally PT pulse 1/4 laterally Capillary reflex immediate bilaterally  Neurological: Sensation to 10 g monofilament wire 5/5 bilaterally Vibratory sensation nonreactive bilaterally Ankle reflex equal and reactive bilaterally  Dermatological: No open skin lesions bilaterally Atrophic skin with absent hair growth bilaterally The toenails 6-10 are extremely elongated, incurvated, deformed, discolored and tender to direct palpation 6-10  Musculoskeletal: Patient walks with roller walker Pes planus bilaterally HAV right  Assessment: Decreased posterior tibial pulses bilaterally 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

## 2016-10-06 NOTE — Patient Instructions (Signed)

## 2016-11-03 ENCOUNTER — Other Ambulatory Visit: Payer: Self-pay | Admitting: Internal Medicine

## 2016-11-03 DIAGNOSIS — R600 Localized edema: Secondary | ICD-10-CM | POA: Diagnosis not present

## 2016-11-03 DIAGNOSIS — I639 Cerebral infarction, unspecified: Secondary | ICD-10-CM | POA: Diagnosis not present

## 2016-11-03 DIAGNOSIS — Z1389 Encounter for screening for other disorder: Secondary | ICD-10-CM | POA: Diagnosis not present

## 2016-11-03 DIAGNOSIS — R569 Unspecified convulsions: Secondary | ICD-10-CM | POA: Diagnosis not present

## 2016-11-03 DIAGNOSIS — N182 Chronic kidney disease, stage 2 (mild): Secondary | ICD-10-CM | POA: Diagnosis not present

## 2016-11-03 DIAGNOSIS — G8191 Hemiplegia, unspecified affecting right dominant side: Secondary | ICD-10-CM | POA: Diagnosis not present

## 2016-11-03 DIAGNOSIS — Z1231 Encounter for screening mammogram for malignant neoplasm of breast: Secondary | ICD-10-CM

## 2016-11-03 DIAGNOSIS — I10 Essential (primary) hypertension: Secondary | ICD-10-CM | POA: Diagnosis not present

## 2016-11-03 DIAGNOSIS — E1165 Type 2 diabetes mellitus with hyperglycemia: Secondary | ICD-10-CM | POA: Diagnosis not present

## 2016-11-03 DIAGNOSIS — E7800 Pure hypercholesterolemia, unspecified: Secondary | ICD-10-CM | POA: Diagnosis not present

## 2016-11-03 DIAGNOSIS — E113299 Type 2 diabetes mellitus with mild nonproliferative diabetic retinopathy without macular edema, unspecified eye: Secondary | ICD-10-CM | POA: Diagnosis not present

## 2016-11-03 DIAGNOSIS — Z0000 Encounter for general adult medical examination without abnormal findings: Secondary | ICD-10-CM | POA: Diagnosis not present

## 2016-11-03 DIAGNOSIS — M1990 Unspecified osteoarthritis, unspecified site: Secondary | ICD-10-CM | POA: Diagnosis not present

## 2016-11-17 ENCOUNTER — Ambulatory Visit
Admission: RE | Admit: 2016-11-17 | Discharge: 2016-11-17 | Disposition: A | Discharge status: Home / Self Care | Payer: Medicare HMO | Source: Ambulatory Visit | Attending: Internal Medicine | Admitting: Internal Medicine

## 2016-11-17 DIAGNOSIS — Z1231 Encounter for screening mammogram for malignant neoplasm of breast: Secondary | ICD-10-CM | POA: Diagnosis not present

## 2016-11-24 DIAGNOSIS — H401134 Primary open-angle glaucoma, bilateral, indeterminate stage: Secondary | ICD-10-CM | POA: Diagnosis not present

## 2016-11-24 DIAGNOSIS — H25013 Cortical age-related cataract, bilateral: Secondary | ICD-10-CM | POA: Diagnosis not present

## 2016-11-24 DIAGNOSIS — H2513 Age-related nuclear cataract, bilateral: Secondary | ICD-10-CM | POA: Diagnosis not present

## 2016-12-29 ENCOUNTER — Emergency Department (HOSPITAL_COMMUNITY)
Admission: EM | Admit: 2016-12-29 | Discharge: 2016-12-29 | Disposition: A | Discharge status: Home / Self Care | Payer: Medicare HMO | Attending: Emergency Medicine | Admitting: Emergency Medicine

## 2016-12-29 ENCOUNTER — Emergency Department (HOSPITAL_COMMUNITY): Payer: Medicare HMO

## 2016-12-29 ENCOUNTER — Encounter (HOSPITAL_COMMUNITY): Payer: Self-pay

## 2016-12-29 DIAGNOSIS — R55 Syncope and collapse: Secondary | ICD-10-CM | POA: Diagnosis present

## 2016-12-29 DIAGNOSIS — Z79899 Other long term (current) drug therapy: Secondary | ICD-10-CM | POA: Insufficient documentation

## 2016-12-29 DIAGNOSIS — R1110 Vomiting, unspecified: Secondary | ICD-10-CM | POA: Insufficient documentation

## 2016-12-29 DIAGNOSIS — E119 Type 2 diabetes mellitus without complications: Secondary | ICD-10-CM | POA: Insufficient documentation

## 2016-12-29 DIAGNOSIS — R531 Weakness: Secondary | ICD-10-CM | POA: Diagnosis not present

## 2016-12-29 DIAGNOSIS — Z8673 Personal history of transient ischemic attack (TIA), and cerebral infarction without residual deficits: Secondary | ICD-10-CM | POA: Diagnosis not present

## 2016-12-29 DIAGNOSIS — I10 Essential (primary) hypertension: Secondary | ICD-10-CM | POA: Diagnosis not present

## 2016-12-29 DIAGNOSIS — R404 Transient alteration of awareness: Secondary | ICD-10-CM | POA: Diagnosis not present

## 2016-12-29 LAB — URINALYSIS, ROUTINE W REFLEX MICROSCOPIC
BILIRUBIN URINE: NEGATIVE
Glucose, UA: NEGATIVE mg/dL
Hgb urine dipstick: NEGATIVE
Ketones, ur: NEGATIVE mg/dL
Leukocytes, UA: NEGATIVE
NITRITE: NEGATIVE
PROTEIN: NEGATIVE mg/dL
SPECIFIC GRAVITY, URINE: 1.008 (ref 1.005–1.030)
pH: 5 (ref 5.0–8.0)

## 2016-12-29 LAB — COMPREHENSIVE METABOLIC PANEL
ALBUMIN: 4.1 g/dL (ref 3.5–5.0)
ALT: 20 U/L (ref 14–54)
AST: 34 U/L (ref 15–41)
Alkaline Phosphatase: 66 U/L (ref 38–126)
Anion gap: 11 (ref 5–15)
BUN: 25 mg/dL — ABNORMAL HIGH (ref 6–20)
CHLORIDE: 106 mmol/L (ref 101–111)
CO2: 21 mmol/L — ABNORMAL LOW (ref 22–32)
Calcium: 9.5 mg/dL (ref 8.9–10.3)
Creatinine, Ser: 1.17 mg/dL — ABNORMAL HIGH (ref 0.44–1.00)
GFR calc Af Amer: 52 mL/min — ABNORMAL LOW (ref >60)
GFR calc non Af Amer: 45 mL/min — ABNORMAL LOW (ref >60)
GLUCOSE: 116 mg/dL — AB (ref 65–99)
POTASSIUM: 5.4 mmol/L — AB (ref 3.5–5.1)
SODIUM: 138 mmol/L (ref 135–145)
Total Bilirubin: 0.7 mg/dL (ref 0.3–1.2)
Total Protein: 7.6 g/dL (ref 6.5–8.1)

## 2016-12-29 LAB — CBC WITH DIFFERENTIAL/PLATELET
BASOS ABS: 0 10*3/uL (ref 0.0–0.1)
BASOS PCT: 0 %
EOS PCT: 1 %
Eosinophils Absolute: 0 10*3/uL (ref 0.0–0.7)
HCT: 41.1 % (ref 36.0–46.0)
Hemoglobin: 13.4 g/dL (ref 12.0–15.0)
Lymphocytes Relative: 16 %
Lymphs Abs: 1.2 10*3/uL (ref 0.7–4.0)
MCH: 28.2 pg (ref 26.0–34.0)
MCHC: 32.6 g/dL (ref 30.0–36.0)
MCV: 86.5 fL (ref 78.0–100.0)
MONO ABS: 0.7 10*3/uL (ref 0.1–1.0)
Monocytes Relative: 10 %
NEUTROS ABS: 5.4 10*3/uL (ref 1.7–7.7)
Neutrophils Relative %: 73 %
PLATELETS: 217 10*3/uL (ref 150–400)
RBC: 4.75 MIL/uL (ref 3.87–5.11)
RDW: 14 % (ref 11.5–15.5)
WBC: 7.3 10*3/uL (ref 4.0–10.5)

## 2016-12-29 LAB — LIPASE, BLOOD: Lipase: 31 U/L (ref 11–51)

## 2016-12-29 LAB — I-STAT TROPONIN, ED
TROPONIN I, POC: 0 ng/mL (ref 0.00–0.08)
Troponin i, poc: 0 ng/mL (ref 0.00–0.08)

## 2016-12-29 LAB — POTASSIUM: POTASSIUM: 4.2 mmol/L (ref 3.5–5.1)

## 2016-12-29 NOTE — ED Provider Notes (Signed)
[facility] DEPT Provider Note   CSN: [id number] Arrival date & time: 12/29/16  1118     History   Chief Complaint Chief Complaint  Patient presents with  . Loss of Consciousness    HPI [name] is a 76 y.o. female.History is obtained from [name] -[name] via telephone who is employed at [employer] and from [name], caretaker patient whoat well and is present with her here  HPI Patient brought from [employer] [facility] where she was difficult to arouse at 10:30 AM today. She eventually became arousable after gentle tactile stimulation. She vomited one time upon regaining consciousness. She is presently asymptomatic. She denies pain anywhere. While at [employer] 10:30 AM today during unresponsive episode blood pressure 82/42 pulse 68 respirations 20. Pulse oximetry on room air was 94% No treatment prior to coming here. Brought by EMS. She did not have syncopal event. Past Medical History:  Diagnosis Date  . Diabetes mellitus   . Hypercholesteremia   . Hypertension   . Stroke [facility])     Patient Active Problem List   Diagnosis Date Noted  . Rhabdomyolysis 01/13/2015  . HLD (hyperlipidemia) 01/13/2015  . Diabetes type 2, controlled (HCC) 01/13/2015  . Faintness   . CVA (cerebral infarction) 10/25/2011  . UTI (lower urinary tract infection) 10/25/2011  . Dehydration 10/25/2011  . Syncope 10/23/2011  . H/O: CVA (cerebrovascular accident) 10/23/2011  . Hypertension 10/23/2011  . Diabetes mellitus (HCC) 10/23/2011    Past Surgical History:  Procedure Laterality Date  . TOOTH EXTRACTION      OB History    No data available       Home Medications    Prior to Admission medications   Medication Sig Start Date End Date Taking? Authorizing Provider  atorvastatin (LIPITOR) 20 MG tablet Take 20 mg by mouth daily. For high cholesterol    [provider]  bisacodyl (DULCOLAX) 5 MG EC tablet Take 5 mg by mouth  daily as needed. For constipation    [provider]  dipyridamole-aspirin (AGGRENOX) 25-200 MG per 12 hr capsule Take 1 capsule by mouth 2 (two) times daily.    [provider]  guaifenesin (ROBITUSSIN) 100 MG/5ML syrup Reported on 08/04/2015    [provider]  hydrALAZINE (APRESOLINE) 50 MG tablet Take 1 tablet (50 mg total) by mouth every 8 (eight) hours. 01/16/15   Joseph Art, DO  KLOR-CON M10 10 MEQ tablet  07/02/16   [provider]  latanoprost (XALATAN) 0.005 % ophthalmic solution Place 1 drop into both eyes at bedtime as needed. Reported on 08/04/2015 12/07/14   [provider]  levETIRAcetam (KEPPRA) 500 MG tablet Take 1 tablet (500 mg total) by mouth 2 (two) times daily. 01/16/15   Joseph Art, DO  lisinopril (PRINIVIL,ZESTRIL) 20 MG tablet Take 20 mg by mouth 2 (two) times daily.    [provider]  polyethylene glycol (MIRALAX / GLYCOLAX) packet Take 17 g by mouth daily. Reported on 08/04/2015    [provider]  rosuvastatin (CRESTOR) 5 MG tablet  08/22/16   [provider]  sennosides-docusate sodium (SENOKOT-S) 8.6-50 MG tablet Take 2 tablets by mouth at bedtime. Reported on 08/04/2015    [provider]    Family History Family History  Problem Relation Age of Onset  . Diabetes type II Unknown   . Hypertension Unknown     Social History Social History  Substance Use Topics  . Smoking  status: Never Smoker  . Smokeless tobacco: Never Used  . Alcohol use No     Allergies   Patient has no known allergies.   Review of Systems Review of Systems  HENT: Negative.   Respiratory: Negative.   Cardiovascular: Negative.   Gastrointestinal: Negative.   Musculoskeletal: Positive for gait problem.       Walks with walker  Skin: Negative.   Allergic/Immunologic: Positive for immunocompromised state.       Diabetic  Neurological: Positive for weakness.       Chronic right-sided weakness    Psychiatric/Behavioral: Negative.      Physical Exam Updated Vital Signs BP (!) 157/71 (BP Location: Right Arm)   Pulse 82   Temp 97.9 F (36.6 C) (Oral)   Resp (!) 22   Ht 4\' 9"  (1.448 m)   Wt 70.3 kg (155 lb)   SpO2 100%   BMI 33.54 kg/m   Physical Exam  Constitutional:  Chronically ill-appearing  HENT:  Head: Normocephalic and atraumatic.  Eyes: Pupils are equal, round, and reactive to light. Conjunctivae are normal.  Neck: Neck supple. No tracheal deviation present. No thyromegaly present.  Cardiovascular: Normal rate and regular rhythm.   No murmur heard. Pulmonary/Chest: Effort normal and breath sounds normal.  Abdominal: Soft. Bowel sounds are normal. She exhibits no distension. There is no tenderness.  Musculoskeletal: Normal range of motion. She exhibits no edema or tenderness.  Neurological: She is alert. Coordination normal.  Moves all extremities cranial nerves II through XII grossly intact  Skin: Skin is warm and dry. No rash noted.  Psychiatric: She has a normal mood and affect.  Nursing note and vitals reviewed.    ED Treatments / Results  Labs (all labs ordered are listed, but only abnormal results are displayed) Labs Reviewed - No data to display  EKG  EKG Interpretation  Date/Time:  Wednesday December 29 2016 11:55:47 EDT Ventricular Rate:  72 PR Interval:    QRS Duration: 93 QT Interval:  423 QTC Calculation: 463 R Axis:   45 Text Interpretation:  Sinus rhythm Nonspecific repol abnormality, lateral leads Confirmed by Doug Sou [phone]) on 12/29/2016 1:09:53 PM       Radiology No results found.  Procedures Procedures (including critical care time)  Medications Ordered in ED Medications - No data to display  Results for orders placed or performed during the hospital encounter of 12/29/16  CBC with Differential/Platelet  Result Value Ref Range   WBC 7.3 4.0 - 10.5 K/uL   RBC 4.75 3.87 - 5.11 MIL/uL   Hemoglobin 13.4 12.0 - 15.0  g/dL   HCT 91.5 05.6 - 97.9 %   MCV 86.5 78.0 - 100.0 fL   MCH 28.2 26.0 - 34.0 pg   MCHC 32.6 30.0 - 36.0 g/dL   RDW 48.0 16.5 - 53.7 %   Platelets 217 150 - 400 K/uL   Neutrophils Relative % 73 %   Neutro Abs 5.4 1.7 - 7.7 K/uL   Lymphocytes Relative 16 %   Lymphs Abs 1.2 0.7 - 4.0 K/uL   Monocytes Relative 10 %   Monocytes Absolute 0.7 0.1 - 1.0 K/uL   Eosinophils Relative 1 %   Eosinophils Absolute 0.0 0.0 - 0.7 K/uL   Basophils Relative 0 %   Basophils Absolute 0.0 0.0 - 0.1 K/uL  Comprehensive metabolic panel  Result Value Ref Range   Sodium 138 135 - 145 mmol/L   Potassium 5.4 (H) 3.5 - 5.1 mmol/L   Chloride 106  101 - 111 mmol/L   CO2 21 (L) 22 - 32 mmol/L   Glucose, Bld 116 (H) 65 - 99 mg/dL   BUN 25 (H) 6 - 20 mg/dL   Creatinine, Ser 9.14 (H) 0.44 - 1.00 mg/dL   Calcium 9.5 8.9 - 78.2 mg/dL   Total Protein 7.6 6.5 - 8.1 g/dL   Albumin 4.1 3.5 - 5.0 g/dL   AST 34 15 - 41 U/L   ALT 20 14 - 54 U/L   Alkaline Phosphatase 66 38 - 126 U/L   Total Bilirubin 0.7 0.3 - 1.2 mg/dL   GFR calc non Af Amer 45 (L) >60 mL/min   GFR calc Af Amer 52 (L) >60 mL/min   Anion gap 11 5 - 15  Lipase, blood  Result Value Ref Range   Lipase 31 11 - 51 U/L  Urinalysis, Routine w reflex microscopic  Result Value Ref Range   Color, Urine STRAW (A) YELLOW   APPearance CLEAR CLEAR   Specific Gravity, Urine 1.008 1.005 - 1.030   pH 5.0 5.0 - 8.0   Glucose, UA NEGATIVE NEGATIVE mg/dL   Hgb urine dipstick NEGATIVE NEGATIVE   Bilirubin Urine NEGATIVE NEGATIVE   Ketones, ur NEGATIVE NEGATIVE mg/dL   Protein, ur NEGATIVE NEGATIVE mg/dL   Nitrite NEGATIVE NEGATIVE   Leukocytes, UA NEGATIVE NEGATIVE  Potassium  Result Value Ref Range   Potassium 4.2 3.5 - 5.1 mmol/L  I-stat troponin, ED  Result Value Ref Range   Troponin i, poc 0.00 0.00 - 0.08 ng/mL   Comment 3          I-stat troponin, ED  Result Value Ref Range   Troponin i, poc 0.00 0.00 - 0.08 ng/mL   Comment 3           Dg Abd  Acute W/chest  Result Date: 12/29/2016 CLINICAL DATA:  Vomiting.  Syncope this morning. EXAM: DG ABDOMEN ACUTE W/ 1V CHEST COMPARISON:  Chest x-ray dated 11/15/2014 and abdominal radiograph dated 09/29/2003 FINDINGS: There is no evidence of dilated bowel loops or free intraperitoneal air. No radiopaque calculi or other significant radiographic abnormality is seen. Heart size and mediastinal contours are within normal limits. Both lungs are clear. IMPRESSION: Negative abdominal radiographs.  No acute cardiopulmonary disease. Electronically Signed   By: Francene Boyers M.D.   On: 12/29/2016 12:36   Initial Impression / Assessment and Plan / ED Course  I have reviewed the triage vital signs and the nursing notes.  Pertinent labs & imaging results that were available during my care of the patient were reviewed by me and considered in my medical decision making (see chart for details).     Patient has been asymptomatic during her entire emergency department stay. Ate a meal. No further vomiting. Patient had no syncope. She was difficult to arouse earlier while at adult daycare center. 2 negative troponins. Unlikely story. Nonacute EKG. doubt cardiac etiology as cause of vomiting. Plan follow-up PMD blood pressure recheck Final diagnoses:  None   Diagnosis #1 vomiting #2 elevated blood pressure New Prescrs New Prescriptions   No medications on file     Doug Sou, MD 12/29/16 [phone]

## 2016-12-29 NOTE — ED Notes (Signed)
Lunch meal given. 

## 2016-12-29 NOTE — ED Notes (Signed)
ED Provider at bedside. 

## 2016-12-29 NOTE — ED Triage Notes (Signed)
Pt presents with syncopal episode today while eating breakfast at [facility] this morning.  Per EMS, staff noted pt to become unresponsive (unknown amount of time); CBG:  122, VSS

## 2016-12-29 NOTE — Discharge Instructions (Signed)
Your blood pressure should be rechecked within the next one or 2 weeks at your doctor's office. Today's was elevated at 164/53

## 2017-01-05 ENCOUNTER — Ambulatory Visit (INDEPENDENT_AMBULATORY_CARE_PROVIDER_SITE_OTHER): Payer: Medicare HMO | Admitting: Podiatry

## 2017-01-05 DIAGNOSIS — E1151 Type 2 diabetes mellitus with diabetic peripheral angiopathy without gangrene: Secondary | ICD-10-CM

## 2017-01-05 DIAGNOSIS — B351 Tinea unguium: Secondary | ICD-10-CM

## 2017-01-05 DIAGNOSIS — M79676 Pain in unspecified toe(s): Secondary | ICD-10-CM

## 2017-01-06 ENCOUNTER — Encounter: Payer: Self-pay | Admitting: Podiatry

## 2017-01-06 NOTE — Patient Instructions (Signed)

## 2017-01-06 NOTE — Progress Notes (Signed)
Patient ID: [name], female   DOB: [birth year], 76 y.o.   MRN: [id number]    Subjective: This patient presents today complaining of thickened and elongated toenails and walking wearing shoes and requests toenail debridement. Last visit for a similar service was 10/06/2016. Patient denies history of foot ulceration, claudication, amputation Patient's daughter is present in the treatment room  Vascular: DP 2/4 bilaterally PT pulse 1/4 bilaterally Capillary reflex immediate bilaterally  Neurological: Sensation to 10 g monofilament wire 5/5 bilaterally Vibratory sensation nonreactive bilaterally Ankle reflex equal and reactive bilaterally  Dermatological: No open skin lesions bilaterally Atrophic skin with absent hair growth bilaterally The toenails 6-10 are extremely elongated, incurvated, deformed, discolored and tender to direct palpation 6-10  Musculoskeletal: Patient walks with roller walker Pes planus bilaterally HAV right  Assessment: Decreased posterior tibial pulses bilaterally Diabetic peripheral arterial disease Protective sensation intact bilaterally symptomatic onychomycoses 6-10 Type II diabetic  Plan: Debridement toenails 6-10 mechanically and electrically without any bleeding  Reappoint 3 months

## 2017-01-14 ENCOUNTER — Emergency Department (HOSPITAL_COMMUNITY)
Admission: EM | Admit: 2017-01-14 | Discharge: 2017-01-14 | Disposition: A | Discharge status: Home / Self Care | Payer: Medicare HMO | Attending: Emergency Medicine | Admitting: Emergency Medicine

## 2017-01-14 DIAGNOSIS — Z8673 Personal history of transient ischemic attack (TIA), and cerebral infarction without residual deficits: Secondary | ICD-10-CM | POA: Insufficient documentation

## 2017-01-14 DIAGNOSIS — Z79899 Other long term (current) drug therapy: Secondary | ICD-10-CM | POA: Diagnosis not present

## 2017-01-14 DIAGNOSIS — I10 Essential (primary) hypertension: Secondary | ICD-10-CM | POA: Insufficient documentation

## 2017-01-14 DIAGNOSIS — Z7982 Long term (current) use of aspirin: Secondary | ICD-10-CM | POA: Diagnosis not present

## 2017-01-14 DIAGNOSIS — E860 Dehydration: Secondary | ICD-10-CM

## 2017-01-14 DIAGNOSIS — R55 Syncope and collapse: Secondary | ICD-10-CM | POA: Diagnosis not present

## 2017-01-14 DIAGNOSIS — F0390 Unspecified dementia without behavioral disturbance: Secondary | ICD-10-CM | POA: Diagnosis not present

## 2017-01-14 DIAGNOSIS — N179 Acute kidney failure, unspecified: Secondary | ICD-10-CM

## 2017-01-14 DIAGNOSIS — E119 Type 2 diabetes mellitus without complications: Secondary | ICD-10-CM | POA: Diagnosis not present

## 2017-01-14 DIAGNOSIS — R404 Transient alteration of awareness: Secondary | ICD-10-CM | POA: Diagnosis not present

## 2017-01-14 LAB — CBC WITH DIFFERENTIAL/PLATELET
BASOS ABS: 0 10*3/uL (ref 0.0–0.1)
BASOS PCT: 0 %
Eosinophils Absolute: 0.1 10*3/uL (ref 0.0–0.7)
Eosinophils Relative: 1 %
HEMATOCRIT: 37.5 % (ref 36.0–46.0)
HEMOGLOBIN: 12.4 g/dL (ref 12.0–15.0)
LYMPHS PCT: 25 %
Lymphs Abs: 1.4 10*3/uL (ref 0.7–4.0)
MCH: 28.7 pg (ref 26.0–34.0)
MCHC: 33.1 g/dL (ref 30.0–36.0)
MCV: 86.8 fL (ref 78.0–100.0)
MONO ABS: 0.4 10*3/uL (ref 0.1–1.0)
Monocytes Relative: 7 %
NEUTROS ABS: 3.8 10*3/uL (ref 1.7–7.7)
NEUTROS PCT: 67 %
Platelets: 235 10*3/uL (ref 150–400)
RBC: 4.32 MIL/uL (ref 3.87–5.11)
RDW: 14.3 % (ref 11.5–15.5)
WBC: 5.7 10*3/uL (ref 4.0–10.5)

## 2017-01-14 LAB — BASIC METABOLIC PANEL
ANION GAP: 8 (ref 5–15)
BUN: 31 mg/dL — ABNORMAL HIGH (ref 6–20)
CO2: 26 mmol/L (ref 22–32)
Calcium: 9.5 mg/dL (ref 8.9–10.3)
Chloride: 105 mmol/L (ref 101–111)
Creatinine, Ser: 1.39 mg/dL — ABNORMAL HIGH (ref 0.44–1.00)
GFR, EST AFRICAN AMERICAN: 42 mL/min — AB (ref >60)
GFR, EST NON AFRICAN AMERICAN: 36 mL/min — AB (ref >60)
GLUCOSE: 136 mg/dL — AB (ref 65–99)
POTASSIUM: 4.6 mmol/L (ref 3.5–5.1)
Sodium: 139 mmol/L (ref 135–145)

## 2017-01-14 MED ORDER — SODIUM CHLORIDE 0.9 % IV BOLUS (SEPSIS)
1000.0000 mL | Freq: Once | INTRAVENOUS | Status: AC
  Administered 2017-01-14: 1000 mL via INTRAVENOUS

## 2017-01-14 NOTE — ED Triage Notes (Signed)
Pt arrived via [facility] from a [facility] after experiencing an episode of hypotension while using the restroom. FD got an initial BP with a systolic in the upper 70's. EMS placed an 18g IV in the left AC. No meds given, no fluids given. Last Vs by EMS: bp 124/52, Sp02 98 on room air, 16 resp, 82 pulse. Pt is alert and oriented to self and place only. 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 No other residual deficits noted.

## 2017-01-14 NOTE — ED Provider Notes (Signed)
[facility] Provider Note   CSN: [id number] Arrival date & time: 01/14/17  1146     History   Chief Complaint Chief Complaint  Patient presents with  . Near Syncope    HPI [name] is a 76 y.o. female.  76 year old female with past medical history including dementia, CVA with right-sided weakness, type 2 diabetes mellitus, hypertension who presents with near syncope. The patient was at her adult daycare facility today and was sitting on the toilet being assisted by staff when she became less responsive and they noted low blood pressure. They report a near syncopal episode. When EMS initially arrived the patient was hypotensive but quickly corrected to normal blood pressure without any intervention. The patient has not able to describe the event but she does state that she has no complaints including no pain or nausea. No breathing problems.  LEVEL 5 CAVEAT DUE TO DEMENTIA   The history is provided by the EMS personnel and the nursing home.  Near Syncope     Past Medical History:  Diagnosis Date  . Diabetes mellitus   . Hypercholesteremia   . Hypertension   . Stroke [facility])     Patient Active Problem List   Diagnosis Date Noted  . Rhabdomyolysis 01/13/2015  . HLD (hyperlipidemia) 01/13/2015  . Diabetes type 2, controlled (HCC) 01/13/2015  . Faintness   . CVA (cerebral infarction) 10/25/2011  . UTI (lower urinary tract infection) 10/25/2011  . Dehydration 10/25/2011  . Syncope 10/23/2011  . H/O: CVA (cerebrovascular accident) 10/23/2011  . Hypertension 10/23/2011  . Diabetes mellitus (HCC) 10/23/2011    Past Surgical History:  Procedure Laterality Date  . TOOTH EXTRACTION      OB History    No data available       Home Medications    Prior to Admission medications   Medication Sig Start Date End Date Taking? Authorizing Provider  aspirin EC 81 MG tablet Take 81 mg by mouth daily.   Yes [provider]  bisacodyl (DULCOLAX) 5 MG EC  tablet Take 5 mg by mouth daily as needed. For constipation   Yes [provider]  cholecalciferol (VITAMIN D) 1000 units tablet Take 1,000 Units by mouth daily.   Yes [provider]  dipyridamole-aspirin (AGGRENOX) 25-200 MG per 12 hr capsule Take 1 capsule by mouth 2 (two) times daily.   Yes [provider]  furosemide (LASIX) 20 MG tablet Take 20 mg by mouth daily. 12/13/16  Yes [provider]  hydrALAZINE (APRESOLINE) 25 MG tablet Take 25 mg by mouth daily. 10/22/16  Yes [provider]  latanoprost (XALATAN) 0.005 % ophthalmic solution Place 1 drop into both eyes at bedtime as needed. Reported on 08/04/2015 12/07/14  Yes [provider]  levETIRAcetam (KEPPRA) 500 MG tablet Take 1 tablet (500 mg total) by mouth 2 (two) times daily. 01/16/15  Yes Vann, Jessica U, DO  lisinopril (PRINIVIL,ZESTRIL) 20 MG tablet Take 20 mg by mouth 2 (two) times daily.   Yes [provider]  rosuvastatin (CRESTOR) 5 MG tablet Take 5 mg by mouth daily at 6 PM.  08/22/16  Yes [provider]  sennosides-docusate sodium (SENOKOT-S) 8.6-50 MG tablet Take 2 tablets by mouth at bedtime. Reported on 08/04/2015   Yes [provider]  hydrALAZINE (APRESOLINE) 50 MG tablet Take 1 tablet (50 mg total) by mouth every 8 (eight) hours. Patient not taking: Reported on 01/14/2017 01/16/15   Joseph Art, DO    Family History  Family History  Problem Relation Age of Onset  . Diabetes type II Unknown   . Hypertension Unknown     Social History Social History  Substance Use Topics  . Smoking status: Never Smoker  . Smokeless tobacco: Never Used  . Alcohol use No     Allergies   Patient has no known allergies.   Review of Systems Review of Systems  Unable to perform ROS: Dementia  Cardiovascular: Positive for near-syncope.     Physical Exam Updated Vital Signs BP (!) 145/48   Pulse 62   Temp (!) 97.5 F (36.4 C) (Oral)   Resp (!) 21    SpO2 99%   Physical Exam  Constitutional: She appears well-developed and well-nourished. No distress.  HENT:  Head: Normocephalic and atraumatic.  Moist mucous membranes  Eyes: Pupils are equal, round, and reactive to light. Conjunctivae are normal.  Neck: Neck supple.  Cardiovascular: Normal rate and regular rhythm.   Murmur heard. Pulmonary/Chest: Effort normal and breath sounds normal.  Abdominal: Soft. Bowel sounds are normal. She exhibits no distension. There is no tenderness.  Musculoskeletal: She exhibits no edema.  Neurological: She is alert.  Oriented to person and place, mild expressive aphasia, L facial droop and right sided weakness  Skin: Skin is warm and dry.  Psychiatric: She has a normal mood and affect. Judgment normal.  Nursing note and vitals reviewed.    ED Treatments / Results  Labs (all labs ordered are listed, but only abnormal results are displayed) Labs Reviewed  BASIC METABOLIC PANEL - Abnormal; Notable for the following:       Result Value   Glucose, Bld 136 (*)    BUN 31 (*)    Creatinine, Ser 1.39 (*)    GFR calc non Af Amer 36 (*)    GFR calc Af Amer 42 (*)    All other components within normal limits  CBC WITH DIFFERENTIAL/PLATELET  CBG MONITORING, ED    EKG  EKG Interpretation  Date/Time:  Friday January 14 2017 12:39:36 EDT Ventricular Rate:  58 PR Interval:    QRS Duration: 88 QT Interval:  452 QTC Calculation: 444 R Axis:   29 Text Interpretation:  Sinus rhythm Probable anterior infarct, old since previous tracing, borderline T wave changes in V6 have improved Confirmed by Frederick Peers [phone]) on 01/14/2017 12:45:31 PM       Radiology No results found.  Procedures Procedures (including critical care time)  Medications Ordered in ED Medications  sodium chloride 0.9 % bolus 1,000 mL (0 mLs Intravenous Stopped 01/14/17 1548)     Initial Impression / Assessment and Plan / ED Course  I have reviewed the triage vital  signs and the nursing notes.  Pertinent labs & imaging results that were available during my care of the patient were reviewed by me and considered in my medical decision making (see chart for details).    Pt w/ witnessed near syncopal episode while sitting on  Toilet, hypotensive initially but resolved PTA. On arrival here, she was awake and comfortable, vital signs reassuring with BP 141/46, heart rate in the 60s, afebrile. She had no complaints. EKG without ischemic changes. Screening labs show mild AK I with BUN 31, creatinine 1.39. Normal CBC. It is possible that mild dehydration has contributed to her episode of near syncope. Gave an IV fluid bolus.  Given that her episode of near syncope occurred while on the toilet and transient hypotension was noted, her presentation is suggestive of vasovagal episode  rather than a life-threatening arrhythmia or other cause. She also has evidence of mild dehydration. I have reviewed her chart and noted that she has had syncopal episodes in the past with negative workup. Given no true syncope or concerning features, I feel she is safe for discharge. She has had food and beverage here and ambulated without problems. Caregiver reports she is at neurologic baseline. I discussed workup findings, supportive measures, and return precautions with the caregiver. She voiced understanding and patient was discharged in satisfactory condition.  Final Clinical Impressions(s) / ED Diagnoses   Final diagnoses:  Near syncope  AKI (acute kidney injury) (HCC)  Dehydration    New Prescriptions New Prescriptions   No medications on file     Little, Ambrose Finland, MD 01/14/17 1747

## 2017-02-09 DIAGNOSIS — E113299 Type 2 diabetes mellitus with mild nonproliferative diabetic retinopathy without macular edema, unspecified eye: Secondary | ICD-10-CM | POA: Diagnosis not present

## 2017-02-09 DIAGNOSIS — E7800 Pure hypercholesterolemia, unspecified: Secondary | ICD-10-CM | POA: Diagnosis not present

## 2017-02-09 DIAGNOSIS — R569 Unspecified convulsions: Secondary | ICD-10-CM | POA: Diagnosis not present

## 2017-02-09 DIAGNOSIS — I10 Essential (primary) hypertension: Secondary | ICD-10-CM | POA: Diagnosis not present

## 2017-02-09 DIAGNOSIS — M6289 Other specified disorders of muscle: Secondary | ICD-10-CM | POA: Diagnosis not present

## 2017-02-09 DIAGNOSIS — J309 Allergic rhinitis, unspecified: Secondary | ICD-10-CM | POA: Diagnosis not present

## 2017-02-09 DIAGNOSIS — N182 Chronic kidney disease, stage 2 (mild): Secondary | ICD-10-CM | POA: Diagnosis not present

## 2017-02-09 DIAGNOSIS — R269 Unspecified abnormalities of gait and mobility: Secondary | ICD-10-CM | POA: Diagnosis not present

## 2017-02-09 DIAGNOSIS — I639 Cerebral infarction, unspecified: Secondary | ICD-10-CM | POA: Diagnosis not present

## 2017-02-16 DIAGNOSIS — E113293 Type 2 diabetes mellitus with mild nonproliferative diabetic retinopathy without macular edema, bilateral: Secondary | ICD-10-CM | POA: Diagnosis not present

## 2017-03-03 DIAGNOSIS — Z23 Encounter for immunization: Secondary | ICD-10-CM | POA: Diagnosis not present

## 2017-05-17 DIAGNOSIS — R269 Unspecified abnormalities of gait and mobility: Secondary | ICD-10-CM | POA: Diagnosis not present

## 2017-05-17 DIAGNOSIS — E7800 Pure hypercholesterolemia, unspecified: Secondary | ICD-10-CM | POA: Diagnosis not present

## 2017-05-17 DIAGNOSIS — E113299 Type 2 diabetes mellitus with mild nonproliferative diabetic retinopathy without macular edema, unspecified eye: Secondary | ICD-10-CM | POA: Diagnosis not present

## 2017-05-17 DIAGNOSIS — I519 Heart disease, unspecified: Secondary | ICD-10-CM | POA: Diagnosis not present

## 2017-05-17 DIAGNOSIS — R569 Unspecified convulsions: Secondary | ICD-10-CM | POA: Diagnosis not present

## 2017-05-17 DIAGNOSIS — N182 Chronic kidney disease, stage 2 (mild): Secondary | ICD-10-CM | POA: Diagnosis not present

## 2017-05-17 DIAGNOSIS — I10 Essential (primary) hypertension: Secondary | ICD-10-CM | POA: Diagnosis not present

## 2017-05-17 DIAGNOSIS — I639 Cerebral infarction, unspecified: Secondary | ICD-10-CM | POA: Diagnosis not present

## 2017-05-17 DIAGNOSIS — G8191 Hemiplegia, unspecified affecting right dominant side: Secondary | ICD-10-CM | POA: Diagnosis not present

## 2017-05-24 ENCOUNTER — Encounter (HOSPITAL_COMMUNITY): Payer: Self-pay

## 2017-05-24 ENCOUNTER — Emergency Department (HOSPITAL_COMMUNITY): Payer: Medicare HMO

## 2017-05-24 ENCOUNTER — Other Ambulatory Visit: Payer: Self-pay

## 2017-05-24 ENCOUNTER — Emergency Department (HOSPITAL_COMMUNITY)
Admission: EM | Admit: 2017-05-24 | Discharge: 2017-05-24 | Disposition: A | Discharge status: Home / Self Care | Payer: Medicare HMO | Attending: Emergency Medicine | Admitting: Emergency Medicine

## 2017-05-24 DIAGNOSIS — Z7902 Long term (current) use of antithrombotics/antiplatelets: Secondary | ICD-10-CM | POA: Diagnosis not present

## 2017-05-24 DIAGNOSIS — R4182 Altered mental status, unspecified: Secondary | ICD-10-CM | POA: Diagnosis not present

## 2017-05-24 DIAGNOSIS — E119 Type 2 diabetes mellitus without complications: Secondary | ICD-10-CM | POA: Insufficient documentation

## 2017-05-24 DIAGNOSIS — R55 Syncope and collapse: Secondary | ICD-10-CM | POA: Diagnosis not present

## 2017-05-24 DIAGNOSIS — I6789 Other cerebrovascular disease: Secondary | ICD-10-CM | POA: Diagnosis not present

## 2017-05-24 DIAGNOSIS — Z8673 Personal history of transient ischemic attack (TIA), and cerebral infarction without residual deficits: Secondary | ICD-10-CM | POA: Insufficient documentation

## 2017-05-24 DIAGNOSIS — R4781 Slurred speech: Secondary | ICD-10-CM | POA: Diagnosis not present

## 2017-05-24 DIAGNOSIS — Z79899 Other long term (current) drug therapy: Secondary | ICD-10-CM | POA: Diagnosis not present

## 2017-05-24 DIAGNOSIS — I10 Essential (primary) hypertension: Secondary | ICD-10-CM | POA: Insufficient documentation

## 2017-05-24 LAB — BASIC METABOLIC PANEL
Anion gap: 9 (ref 5–15)
BUN: 32 mg/dL — AB (ref 6–20)
CHLORIDE: 109 mmol/L (ref 101–111)
CO2: 24 mmol/L (ref 22–32)
CREATININE: 1.6 mg/dL — AB (ref 0.44–1.00)
Calcium: 9.5 mg/dL (ref 8.9–10.3)
GFR calc Af Amer: 35 mL/min — ABNORMAL LOW (ref >60)
GFR, EST NON AFRICAN AMERICAN: 30 mL/min — AB (ref >60)
Glucose, Bld: 155 mg/dL — ABNORMAL HIGH (ref 65–99)
Potassium: 4.2 mmol/L (ref 3.5–5.1)
SODIUM: 142 mmol/L (ref 135–145)

## 2017-05-24 LAB — URINALYSIS, ROUTINE W REFLEX MICROSCOPIC
Bilirubin Urine: NEGATIVE
Glucose, UA: NEGATIVE mg/dL
Hgb urine dipstick: NEGATIVE
Ketones, ur: NEGATIVE mg/dL
Leukocytes, UA: NEGATIVE
Nitrite: POSITIVE — AB
PH: 5 (ref 5.0–8.0)
Protein, ur: NEGATIVE mg/dL
RBC / HPF: NONE SEEN RBC/hpf (ref 0–5)
SPECIFIC GRAVITY, URINE: 1.009 (ref 1.005–1.030)
SQUAMOUS EPITHELIAL / LPF: NONE SEEN

## 2017-05-24 LAB — CBC
HCT: 38 % (ref 36.0–46.0)
Hemoglobin: 12.3 g/dL (ref 12.0–15.0)
MCH: 28.7 pg (ref 26.0–34.0)
MCHC: 32.4 g/dL (ref 30.0–36.0)
MCV: 88.8 fL (ref 78.0–100.0)
PLATELETS: 245 10*3/uL (ref 150–400)
RBC: 4.28 MIL/uL (ref 3.87–5.11)
RDW: 14.5 % (ref 11.5–15.5)
WBC: 7.4 10*3/uL (ref 4.0–10.5)

## 2017-05-24 LAB — CBG MONITORING, ED: Glucose-Capillary: 157 mg/dL — ABNORMAL HIGH (ref 65–99)

## 2017-05-24 MED ORDER — SODIUM CHLORIDE 0.9 % IV BOLUS (SEPSIS)
1000.0000 mL | Freq: Once | INTRAVENOUS | Status: AC
  Administered 2017-05-24: 1000 mL via INTRAVENOUS

## 2017-05-24 NOTE — ED Provider Notes (Signed)
[facility] EMERGENCY DEPARTMENT Provider Note   CSN: [id number] Arrival date & time: 05/24/17  1745     History   Chief Complaint Chief Complaint  Patient presents with  . Loss of Consciousness    HPI KAMALI CARMOUCHE is a 76 y.o. female.  The history is provided by the patient.  Altered Mental Status   This is a new problem. The current episode started 1 to 2 hours ago. The problem has been resolved. Associated symptoms include unresponsiveness. Risk factors: h/o syncope. Her past medical history is significant for diabetes and dementia.    Past Medical History:  Diagnosis Date  . Diabetes mellitus   . Hypercholesteremia   . Hypertension   . Stroke [facility])     Patient Active Problem List   Diagnosis Date Noted  . Rhabdomyolysis 01/13/2015  . HLD (hyperlipidemia) 01/13/2015  . Diabetes type 2, controlled (HCC) 01/13/2015  . Faintness   . CVA (cerebral infarction) 10/25/2011  . UTI (lower urinary tract infection) 10/25/2011  . Dehydration 10/25/2011  . Syncope 10/23/2011  . H/O: CVA (cerebrovascular accident) 10/23/2011  . Hypertension 10/23/2011  . Diabetes mellitus (HCC) 10/23/2011    Past Surgical History:  Procedure Laterality Date  . TOOTH EXTRACTION      OB History    No data available       Home Medications    Prior to Admission medications   Medication Sig Start Date End Date Taking? Authorizing Provider  bisacodyl (DULCOLAX) 5 MG EC tablet Take 5 mg by mouth daily as needed for mild constipation.    Yes [provider]  Cholecalciferol (VITAMIN D-3 PO) Take 1 capsule by mouth daily.   Yes [provider]  dipyridamole-aspirin (AGGRENOX) 25-200 MG per 12 hr capsule Take 1 capsule by mouth 2 (two) times daily.   Yes [provider]  furosemide (LASIX) 20 MG tablet Take 20 mg by mouth every Monday, Wednesday, and Friday.  12/13/16  Yes [provider]  hydrALAZINE (APRESOLINE) 25 MG tablet Take 25  mg by mouth 2 (two) times daily.  10/22/16  Yes [provider]  KLOR-CON M10 10 MEQ tablet Take 10 mEq by mouth every Monday, Wednesday, and Friday.  04/27/17  Yes [provider]  latanoprost (XALATAN) 0.005 % ophthalmic solution Place 1 drop into both eyes at bedtime. Reported on 08/04/2015 12/07/14  Yes [provider]  levETIRAcetam (KEPPRA) 500 MG tablet Take 1 tablet (500 mg total) by mouth 2 (two) times daily. 01/16/15  Yes Vann, Jessica U, DO  lisinopril (PRINIVIL,ZESTRIL) 20 MG tablet Take 20 mg by mouth 2 (two) times daily.   Yes [provider]  rosuvastatin (CRESTOR) 5 MG tablet Take 5 mg by mouth daily.  08/22/16  Yes [provider]  hydrALAZINE (APRESOLINE) 50 MG tablet Take 1 tablet (50 mg total) by mouth every 8 (eight) hours. Patient not taking: Reported on 05/24/2017 01/16/15   Joseph Art, DO    Family History Family History  Problem Relation Age of Onset  . Diabetes type II Unknown   . Hypertension Unknown     Social History Social History   Tobacco Use  . Smoking status: Never Smoker  . Smokeless tobacco: Never Used  Substance Use Topics  . Alcohol use: No  . Drug use: No     Allergies   Patient has no known allergies.   Review of Systems Review of Systems  Unable to perform ROS: Dementia  Physical Exam Updated Vital Signs BP (!) 141/53   Pulse 70   Temp 97.6 F (36.4 C) (Oral)   Resp 16   Ht 5\' 1"  (1.549 m)   Wt 63.5 kg (140 lb)   SpO2 100%   BMI 26.45 kg/m   Physical Exam  Constitutional: She appears well-developed and well-nourished. No distress.  HENT:  Head: Normocephalic and atraumatic.  Eyes: Conjunctivae are normal.  Neck: Neck supple.  Cardiovascular: Normal rate and regular rhythm.  No murmur heard. Pulmonary/Chest: Effort normal and breath sounds normal. No respiratory distress.  Abdominal: Soft. There is no tenderness.  Musculoskeletal: She exhibits no edema.  Neurological:  She is alert.  Oriented to person, place, date, but not age; residual right facial & right hemi-body weakness from prior CVA  Skin: Skin is warm and dry.  Psychiatric: She has a normal mood and affect.  Nursing note and vitals reviewed.    ED Treatments / Results  Labs (all labs ordered are listed, but only abnormal results are displayed) Labs Reviewed  BASIC METABOLIC PANEL - Abnormal; Notable for the following components:      Result Value   Glucose, Bld 155 (*)    BUN 32 (*)    Creatinine, Ser 1.60 (*)    GFR calc non Af Amer 30 (*)    GFR calc Af Amer 35 (*)    All other components within normal limits  URINALYSIS, ROUTINE W REFLEX MICROSCOPIC - Abnormal; Notable for the following components:   Nitrite POSITIVE (*)    Bacteria, UA FEW (*)    All other components within normal limits  CBG MONITORING, ED - Abnormal; Notable for the following components:   Glucose-Capillary 157 (*)    All other components within normal limits  CBC    EKG  EKG Interpretation  Date/Time:  Tuesday May 24 2017 17:54:43 EST Ventricular Rate:  88 PR Interval:    QRS Duration: 80 QT Interval:  372 QTC Calculation: 451 R Axis:   4 Text Interpretation:  Sinus rhythm Anterior infarct, old No significant change since last tracing Confirmed by Richardean Canal [phone]) on 05/24/2017 6:44:39 PM       Radiology Ct Head Wo Contrast  Result Date: 05/24/2017 CLINICAL DATA:  76 year old female with altered mental status. EXAM: CT HEAD WITHOUT CONTRAST TECHNIQUE: Contiguous axial images were obtained from the base of the skull through the vertex without intravenous contrast. COMPARISON:  Head CT dated 01/13/2015 and MRI dated 01/14/2015 FINDINGS: Brain: There is moderate age-related atrophy and chronic microvascular ischemic changes. Old bilateral cerebellar infarcts noted. There is no acute intracranial hemorrhage. No mass effect or midline shift. No extra-axial fluid collection. Vascular: No  hyperdense vessel or unexpected calcification. Skull: Normal. Negative for fracture or focal lesion. Sinuses/Orbits: Mild mucoperiosteal thickening of paranasal sinuses. No air-fluid levels. The mastoid air cells are clear. Other: Noted IMPRESSION: 1. No acute intracranial hemorrhage. 2. Age-related atrophy and chronic microvascular ischemic changes. Chronic bilateral cerebellar infarcts. Electronically Signed   By: Elgie Collard M.D.   On: 05/24/2017 19:13   Procedures Procedures (including critical care time)  Medications Ordered in ED Medications  sodium chloride 0.9 % bolus 1,000 mL (0 mLs Intravenous Stopped 05/24/17 2206)     Initial Impression / Assessment and Plan / ED Course  I have reviewed the triage vital signs and the nursing notes.  Pertinent labs & imaging results that were available during my care of the patient were reviewed by me and considered in  my medical decision making (see chart for details).     Pt with h/o dementia, CVA (residual right-sided weakness), recurrent syncopal episodes presents after a syncopal episode. Per EMS, the Pt was sitting at the dinner table w/family and had a syncopal episode; Pt did not injure herself, & family says the episode lasted for approximately 15mins. She was reportedly "groggy" when the medics showed up, but was able to answer questions appropriately. Pt says she was just sleepy b/c she'd eaten a big meal; denies pain or any recent illness.  VS & exam as above. EKG: NSR @ 88bpm w/normal intervals & no signs of ischemia; similar in appearance to tracing from 8/18. NS bolus given in the ED. CT head w/NAICA. Labs remarkable for Crt 1.60 (mild elevation from baseline). UA w/nitrites and few bacteria; denies symptoms so will not treat.  Orthostatics negative. Cause of the Pt's symptoms unclear, but doubt emergent etiology given history, exam, and workup.  Explained all results to the Pt & family. Will discharge the Pt home. Recommending  follow-up with PCP. ED return precautions provided. Pt acknowledged understanding of, and concurrence with the plan. All questions answered to her satisfaction. In stable condition at the time of discharge.  Final Clinical Impressions(s) / ED Diagnoses   Final diagnoses:  Syncope, unspecified syncope type    ED Discharge Orders    None       Forest BeckerPetit, Shakayla Hickox, MD 05/24/17 2331    Charlynne PanderYao, David Hsienta, MD 05/26/17 [phone]

## 2017-05-24 NOTE — ED Triage Notes (Signed)
Pt arrives to ED from home with complaints of syncopal episode lasting approx 15 mins  since 1655 this evening. EMS reports pt was groggy upon arrival, having just eaten dinner with family and was reported to be passed out. Pt is alert and oriented presently. Pt placed in position of comfort with bed locked and lowered, call bell in reach.

## 2017-05-24 NOTE — ED Notes (Signed)
Moved in error.

## 2017-05-24 NOTE — ED Notes (Signed)
Pt left for CT.

## 2017-05-24 NOTE — ED Notes (Signed)
Spoke with [name] on the phone, states she will be here to pick up patient in 1 hr (@ 2230)

## 2017-07-08 ENCOUNTER — Other Ambulatory Visit: Payer: Self-pay

## 2017-07-08 ENCOUNTER — Emergency Department (HOSPITAL_COMMUNITY): Payer: Medicare HMO

## 2017-07-08 ENCOUNTER — Emergency Department (HOSPITAL_COMMUNITY)
Admission: EM | Admit: 2017-07-08 | Discharge: 2017-07-08 | Disposition: A | Discharge status: Home / Self Care | Payer: Medicare HMO | Attending: Emergency Medicine | Admitting: Emergency Medicine

## 2017-07-08 DIAGNOSIS — W07XXXA Fall from chair, initial encounter: Secondary | ICD-10-CM | POA: Insufficient documentation

## 2017-07-08 DIAGNOSIS — J029 Acute pharyngitis, unspecified: Secondary | ICD-10-CM | POA: Diagnosis not present

## 2017-07-08 DIAGNOSIS — R05 Cough: Secondary | ICD-10-CM | POA: Diagnosis not present

## 2017-07-08 DIAGNOSIS — W19XXXA Unspecified fall, initial encounter: Secondary | ICD-10-CM

## 2017-07-08 DIAGNOSIS — E119 Type 2 diabetes mellitus without complications: Secondary | ICD-10-CM | POA: Diagnosis not present

## 2017-07-08 DIAGNOSIS — Z79899 Other long term (current) drug therapy: Secondary | ICD-10-CM | POA: Diagnosis not present

## 2017-07-08 DIAGNOSIS — Y92129 Unspecified place in nursing home as the place of occurrence of the external cause: Secondary | ICD-10-CM | POA: Diagnosis not present

## 2017-07-08 DIAGNOSIS — Z7982 Long term (current) use of aspirin: Secondary | ICD-10-CM | POA: Insufficient documentation

## 2017-07-08 DIAGNOSIS — R030 Elevated blood-pressure reading, without diagnosis of hypertension: Secondary | ICD-10-CM | POA: Diagnosis not present

## 2017-07-08 DIAGNOSIS — I10 Essential (primary) hypertension: Secondary | ICD-10-CM | POA: Diagnosis not present

## 2017-07-08 DIAGNOSIS — Y9389 Activity, other specified: Secondary | ICD-10-CM | POA: Insufficient documentation

## 2017-07-08 DIAGNOSIS — F0390 Unspecified dementia without behavioral disturbance: Secondary | ICD-10-CM | POA: Insufficient documentation

## 2017-07-08 DIAGNOSIS — Y999 Unspecified external cause status: Secondary | ICD-10-CM | POA: Insufficient documentation

## 2017-07-08 LAB — I-STAT CHEM 8, ED
BUN: 29 mg/dL — ABNORMAL HIGH (ref 6–20)
CALCIUM ION: 1.15 mmol/L (ref 1.15–1.40)
CHLORIDE: 99 mmol/L — AB (ref 101–111)
Creatinine, Ser: 1.1 mg/dL — ABNORMAL HIGH (ref 0.44–1.00)
GLUCOSE: 79 mg/dL (ref 65–99)
HCT: 43 % (ref 36.0–46.0)
Hemoglobin: 14.6 g/dL (ref 12.0–15.0)
Potassium: 5.3 mmol/L — ABNORMAL HIGH (ref 3.5–5.1)
Sodium: 136 mmol/L (ref 135–145)
TCO2: 27 mmol/L (ref 22–32)

## 2017-07-08 MED ORDER — ALBUTEROL SULFATE (2.5 MG/3ML) 0.083% IN NEBU
5.0000 mg | INHALATION_SOLUTION | Freq: Once | RESPIRATORY_TRACT | Status: DC
  Filled 2017-07-08: qty 6

## 2017-07-08 NOTE — ED Notes (Signed)
Pt ambulated with one staff assist. Pt lethargic, fell asleep x2 while attempting to get out of bed.  Caregiver at bedside reports pt less ambulatory and more difficulty getting OOB than at home.  This RN expressed concerns for pt safety at home, discussed with caregiver that pt was here at ED d/t fall at home.  Caregiver reports intention to take pt to caregiver's house if pt less mobile in home setting. Dr. Patria Mane made aware.  Pt set for dc home.

## 2017-07-08 NOTE — Progress Notes (Signed)
CSW met with [employer] from [place]. CSW was asked to provide Crystal with H&P as well other notes on pt. CSW gave Crystal needed documents and was asked to fax over any further information as given. CSW will continue to follow for any needs at this time.       [name] [name], MSW, [place] [facility] Clinical Social Worker [phone]

## 2017-07-08 NOTE — Progress Notes (Addendum)
12:53pm- CSW spoke with [facility] APS and was informed that a report was already made on this pt. Per APS Crystal White would be assigned to the case for further investigation on this matter. RN was able to provide further details on pt's appearance when pt arrived to the ED today. Per caregiver at bedside, pt does not wish to have SNF placement. CSW reached out to [facility] to confirm potential placement for pt at this time. No call back at this time. CSW left VM.   CSW spoke with pt and caregiver at bedside. CSW was informed that pt is from home where pt has caregivers during the day- however pt now goes to an Adult Daycare which has eliminated some need for caregivers during the day. CSW was informed by Ms. Sellars (pt caregiver) that she has set up an evening care giver to be with pt starting at 6:30pm everyday. CSW was informed that this caregiver is Irelean. Per Ms. Sellars, pt wishes to not be placed within a SNF and would rather be at home. Pt was unable to confirm this for CSW as pt was asleep and pt reportedly has dementia. Per Ms. Sellars caregiver, pt has a POA [facility] listed in chart). CSW was unable to speak with POA and left VM. CSW to make an APS report on this case, so that if pt returns home with caregivers APS can follow up with this case.     Claude Manges Tameisha Covell, MSW, LCSW-A Emergency Department Clinical Social Worker [phone]

## 2017-07-08 NOTE — ED Triage Notes (Addendum)
Arrived via EMS from home who lives by herself. History of dementia and right side deficit from a previous stroke. Patient slipped and fell while ambulating no complaints from fall however complaints of a sore throat. C-collar placed prior to arrival. Alert to normal per EMS. Patient incontinent of urine and stool.

## 2017-07-08 NOTE — ED Provider Notes (Signed)
[facility] EMERGENCY DEPARTMENT Provider Note   CSN: [id number] Arrival date & time: 07/08/17  0935     History   Chief Complaint Chief Complaint  Patient presents with  . Fall  . Sore Throat    HPI [name] is a 77 y.o. female.  HPI Patient is a 77 year old female presents to the emergency department after reported fall.  She states that she slipped out of a chair and fell down onto her bottom.  She has a history of dementia.  Patient has baseline residual weakness of her right upper right lower extremity from a prior stroke.  Family reports no changes in her speech or new facial droops.  Family reports baseline strength in her arm and leg on the right.  Patient brought to the ER for further evaluation.  No recent illness.  No change in medications.  Patient reports mild sore throat without fever or chills.  Able to drink and eat.   Past Medical History:  Diagnosis Date  . Diabetes mellitus   . Hypercholesteremia   . Hypertension   . Stroke [facility])     Patient Active Problem List   Diagnosis Date Noted  . Rhabdomyolysis 01/13/2015  . HLD (hyperlipidemia) 01/13/2015  . Diabetes type 2, controlled (HCC) 01/13/2015  . Faintness   . CVA (cerebral infarction) 10/25/2011  . UTI (lower urinary tract infection) 10/25/2011  . Dehydration 10/25/2011  . Syncope 10/23/2011  . H/O: CVA (cerebrovascular accident) 10/23/2011  . Hypertension 10/23/2011  . Diabetes mellitus (HCC) 10/23/2011    Past Surgical History:  Procedure Laterality Date  . TOOTH EXTRACTION      OB History    No data available       Home Medications    Prior to Admission medications   Medication Sig Start Date End Date Taking? Authorizing Provider  bisacodyl (DULCOLAX) 5 MG EC tablet Take 5 mg by mouth daily as needed for mild constipation.    Yes [provider]  Cholecalciferol (VITAMIN D-3 PO) Take 1 capsule by mouth daily.   Yes [provider]    dipyridamole-aspirin (AGGRENOX) 25-200 MG per 12 hr capsule Take 1 capsule by mouth 2 (two) times daily.   Yes [provider]  furosemide (LASIX) 20 MG tablet Take 20 mg by mouth every Monday, Wednesday, and Friday.  12/13/16  Yes [provider]  hydrALAZINE (APRESOLINE) 25 MG tablet Take 25 mg by mouth 2 (two) times daily.  10/22/16  Yes [provider]  KLOR-CON M10 10 MEQ tablet Take 10 mEq by mouth every Monday, Wednesday, and Friday.  04/27/17  Yes [provider]  latanoprost (XALATAN) 0.005 % ophthalmic solution Place 1 drop into both eyes at bedtime. Reported on 08/04/2015 12/07/14  Yes [provider]  levETIRAcetam (KEPPRA) 500 MG tablet Take 1 tablet (500 mg total) by mouth 2 (two) times daily. 01/16/15  Yes Vann, Jessica U, DO  lisinopril (PRINIVIL,ZESTRIL) 20 MG tablet Take 20 mg by mouth 2 (two) times daily.   Yes [provider]  rosuvastatin (CRESTOR) 5 MG tablet Take 5 mg by mouth daily.  08/22/16  Yes [provider]  hydrALAZINE (APRESOLINE) 50 MG tablet Take 1 tablet (50 mg total) by mouth every 8 (eight) hours. Patient not taking: Reported on 05/24/2017 01/16/15   Joseph Art, DO    Family History Family History  Problem Relation Age of Onset  . Diabetes type II Unknown   . Hypertension Unknown  Social History Social History   Tobacco Use  . Smoking status: Never Smoker  . Smokeless tobacco: Never Used  Substance Use Topics  . Alcohol use: No  . Drug use: No     Allergies   Patient has no known allergies.   Review of Systems Review of Systems  All other systems reviewed and are negative.    Physical Exam Updated Vital Signs BP (!) 160/78   Pulse 78   Temp 99.6 F (37.6 C) (Oral)   Resp (!) 23   Ht 5\' 2"  (1.575 m)   Wt 63.5 kg (140 lb)   SpO2 95%   BMI 25.61 kg/m   Physical Exam  Constitutional: She is oriented to person, place, and time. She appears well-developed and  well-nourished. No distress.  HENT:  Head: Normocephalic and atraumatic.  Mouth/Throat: Uvula is midline and oropharynx is clear and moist.  Eyes: EOM are normal. Pupils are equal, round, and reactive to light.  Neck: Normal range of motion. Neck supple.  C-spine non tender.  Cardiovascular: Normal rate, regular rhythm and normal heart sounds.  Pulmonary/Chest: Effort normal and breath sounds normal.  Abdominal: Soft. She exhibits no distension. There is no tenderness.  Musculoskeletal: Normal range of motion.  Full range of motion of bilateral shoulders, elbows and wrists. Full range of motion of bilateral hips, knees and ankles.  No thoracic or lumbar tenderness   Neurological: She is alert and oriented to person, place, and time.  5/5 strength in major muscle groups of  bilateral upper and lower extremities. Speech normal. No facial asymetry.   Skin: Skin is warm and dry.  Psychiatric: She has a normal mood and affect. Judgment normal.  Nursing note and vitals reviewed.    ED Treatments / Results  Labs (all labs ordered are listed, but only abnormal results are displayed) Labs Reviewed  I-STAT CHEM 8, ED - Abnormal; Notable for the following components:      Result Value   Potassium 5.3 (*)    Chloride 99 (*)    BUN 29 (*)    Creatinine, Ser 1.10 (*)    All other components within normal limits    EKG  EKG Interpretation  Date/Time:  Friday July 08 2017 10:41:53 EST Ventricular Rate:  78 PR Interval:    QRS Duration: 82 QT Interval:  380 QTC Calculation: 433 R Axis:   8 Text Interpretation:  Sinus rhythm Anteroseptal infarct, old No significant change was found Confirmed by Azalia Bilis ([id number]) on 07/08/2017 11:00:04 AM       Radiology Dg Chest 2 View  Result Date: 07/08/2017 CLINICAL DATA:  Productive cough, tachypnea EXAM: CHEST  2 VIEW COMPARISON:  11/15/2014 FINDINGS: Low lung volumes. Heart is accentuated by the low volumes, borderline in size. Lungs  clear. No effusions or edema. No acute bony abnormality. IMPRESSION: Borderline heart size.  Low lung volumes.  No active disease. Electronically Signed   By: Charlett Nose M.D.   On: 07/08/2017 10:39    Procedures Procedures (including critical care time)  Medications Ordered in ED Medications - No data to display   Initial Impression / Assessment and Plan / ED Course  I have reviewed the triage vital signs and the nursing notes.  Pertinent labs & imaging results that were available during my care of the patient were reviewed by me and considered in my medical decision making (see chart for details).     Overall well-appearing.  Baseline mental status.  Ambulatory in the  ER.  Likely mechanical fall.  Full range of motion of major joints.  No new weakness of her arms or legs.  Discharged home in good condition.  Primary care follow-up.  BUN  Date Value Ref Range Status  07/08/2017 29 (H) 6 - 20 mg/dL Final  16/03/9603 32 (H) 6 - 20 mg/dL Final  54/01/8118 31 (H) 6 - 20 mg/dL Final  14/78/2956 25 (H) 6 - 20 mg/dL Final  21/30/8657 22 (A) 4 - 21 mg/dL Final   Creatinine, Ser  Date Value Ref Range Status  07/08/2017 1.10 (H) 0.44 - 1.00 mg/dL Final  84/69/6295 2.84 (H) 0.44 - 1.00 mg/dL Final  13/24/4010 2.72 (H) 0.44 - 1.00 mg/dL Final  53/66/4403 4.74 (H) 0.44 - 1.00 mg/dL Final      Final Clinical Impressions(s) / ED Diagnoses   Final diagnoses:  Fall, initial encounter    ED Discharge Orders    None       Azalia Bilis, MD 07/08/17 [phone]

## 2017-07-08 NOTE — ED Notes (Signed)
Unsuccessful attempt to draw labs x2 

## 2017-07-11 ENCOUNTER — Telehealth: Payer: Self-pay

## 2017-07-11 NOTE — Telephone Encounter (Signed)
CSW received call from [employer] with [facility] APS. CSW was asked by Ms. White to send over any further notes on pt at this time. CSW faxed over note that included where pt was discharged to as well as the EDP note at this this time.

## 2017-07-13 DIAGNOSIS — J209 Acute bronchitis, unspecified: Secondary | ICD-10-CM | POA: Diagnosis not present

## 2017-08-03 ENCOUNTER — Ambulatory Visit (INDEPENDENT_AMBULATORY_CARE_PROVIDER_SITE_OTHER): Payer: Medicare HMO | Admitting: Podiatry

## 2017-08-03 ENCOUNTER — Encounter: Payer: Self-pay | Admitting: Podiatry

## 2017-08-03 DIAGNOSIS — B351 Tinea unguium: Secondary | ICD-10-CM | POA: Diagnosis not present

## 2017-08-03 DIAGNOSIS — E1151 Type 2 diabetes mellitus with diabetic peripheral angiopathy without gangrene: Secondary | ICD-10-CM | POA: Diagnosis not present

## 2017-08-03 DIAGNOSIS — M79675 Pain in left toe(s): Secondary | ICD-10-CM

## 2017-08-03 DIAGNOSIS — M79674 Pain in right toe(s): Secondary | ICD-10-CM | POA: Diagnosis not present

## 2017-08-03 NOTE — Progress Notes (Signed)
Complaint:  Visit Type: Patient returns to my office for continued preventative foot care services. Complaint: Patient states" my nails have grown long and thick and become painful to walk and wear shoes" Patient has been diagnosed with DM with no foot complications. . The patient presents for preventative foot care services. No changes to ROS  Podiatric Exam: Vascular: dorsalis pedis and posterior tibial pulses are palpable bilateral. Capillary return is immediate. Temperature gradient is WNL. Skin turgor WNL  Sensorium: Normal Semmes Weinstein monofilament test. Normal tactile sensation bilaterally. Nail Exam: Pt has thick disfigured discolored nails with subungual debris noted bilateral entire nail hallux through fifth toenails Ulcer Exam: There is no evidence of ulcer or pre-ulcerative changes or infection. Orthopedic Exam: Muscle tone and strength are WNL. No limitations in general ROM. No crepitus or effusions noted. Foot type and digits show no abnormalities. HAV right. Skin: No Porokeratosis. No infection or ulcers  Diagnosis:  Onychomycosis, , Pain in right toe, pain in left toes  Treatment & Plan Procedures and Treatment: Consent by patient was obtained for treatment procedures.   Debridement of mycotic and hypertrophic toenails, 1 through 5 bilateral and clearing of subungual debris. No ulceration, no infection noted.  Return Visit-Office Procedure: Patient instructed to return to the office for a follow up visit 3 months for continued evaluation and treatment.    Helane Gunther DPM

## 2017-08-17 DIAGNOSIS — N182 Chronic kidney disease, stage 2 (mild): Secondary | ICD-10-CM | POA: Diagnosis not present

## 2017-08-17 DIAGNOSIS — E113299 Type 2 diabetes mellitus with mild nonproliferative diabetic retinopathy without macular edema, unspecified eye: Secondary | ICD-10-CM | POA: Diagnosis not present

## 2017-08-17 DIAGNOSIS — G8191 Hemiplegia, unspecified affecting right dominant side: Secondary | ICD-10-CM | POA: Diagnosis not present

## 2017-08-17 DIAGNOSIS — R569 Unspecified convulsions: Secondary | ICD-10-CM | POA: Diagnosis not present

## 2017-08-17 DIAGNOSIS — R269 Unspecified abnormalities of gait and mobility: Secondary | ICD-10-CM | POA: Diagnosis not present

## 2017-08-17 DIAGNOSIS — I10 Essential (primary) hypertension: Secondary | ICD-10-CM | POA: Diagnosis not present

## 2017-08-17 DIAGNOSIS — E7800 Pure hypercholesterolemia, unspecified: Secondary | ICD-10-CM | POA: Diagnosis not present

## 2017-08-17 DIAGNOSIS — I639 Cerebral infarction, unspecified: Secondary | ICD-10-CM | POA: Diagnosis not present

## 2017-08-17 DIAGNOSIS — R05 Cough: Secondary | ICD-10-CM | POA: Diagnosis not present

## 2017-11-02 ENCOUNTER — Encounter: Payer: Self-pay | Admitting: Podiatry

## 2017-11-02 ENCOUNTER — Ambulatory Visit (INDEPENDENT_AMBULATORY_CARE_PROVIDER_SITE_OTHER): Payer: Medicare HMO | Admitting: Podiatry

## 2017-11-02 DIAGNOSIS — M79675 Pain in left toe(s): Secondary | ICD-10-CM | POA: Diagnosis not present

## 2017-11-02 DIAGNOSIS — B351 Tinea unguium: Secondary | ICD-10-CM | POA: Diagnosis not present

## 2017-11-02 DIAGNOSIS — M79674 Pain in right toe(s): Secondary | ICD-10-CM

## 2017-11-02 DIAGNOSIS — E1151 Type 2 diabetes mellitus with diabetic peripheral angiopathy without gangrene: Secondary | ICD-10-CM

## 2017-11-02 NOTE — Progress Notes (Signed)
Complaint:  Visit Type: Patient returns to my office for continued preventative foot care services. Complaint: Patient states" my nails have grown long and thick and become painful to walk and wear shoes" Patient has been diagnosed with DM with no foot complications. . The patient presents for preventative foot care services. No changes to ROS  Podiatric Exam: Vascular: dorsalis pedis and posterior tibial pulses are palpable bilateral. Capillary return is immediate. Temperature gradient is WNL. Skin turgor WNL  Sensorium: Normal Semmes Weinstein monofilament test. Normal tactile sensation bilaterally. Nail Exam: Pt has thick disfigured discolored nails with subungual debris noted bilateral entire nail hallux through fifth toenails Ulcer Exam: There is no evidence of ulcer or pre-ulcerative changes or infection. Orthopedic Exam: Muscle tone and strength are WNL. No limitations in general ROM. No crepitus or effusions noted. Foot type and digits show no abnormalities. HAV right greater than left. Skin: No Porokeratosis. No infection or ulcers  Diagnosis:  Onychomycosis, , Pain in right toe, pain in left toes  Treatment & Plan Procedures and Treatment: Consent by patient was obtained for treatment procedures.   Debridement of mycotic and hypertrophic toenails, 1 through 5 bilateral and clearing of subungual debris. No ulceration, no infection noted.  Return Visit-Office Procedure: Patient instructed to return to the office for a follow up visit 3 months for continued evaluation and treatment.    Daliana Leverett DPM 

## 2017-11-15 ENCOUNTER — Other Ambulatory Visit: Payer: Self-pay | Admitting: Internal Medicine

## 2017-11-15 DIAGNOSIS — Z1389 Encounter for screening for other disorder: Secondary | ICD-10-CM | POA: Diagnosis not present

## 2017-11-15 DIAGNOSIS — Z1239 Encounter for other screening for malignant neoplasm of breast: Secondary | ICD-10-CM | POA: Diagnosis not present

## 2017-11-15 DIAGNOSIS — G8191 Hemiplegia, unspecified affecting right dominant side: Secondary | ICD-10-CM | POA: Diagnosis not present

## 2017-11-15 DIAGNOSIS — Z0000 Encounter for general adult medical examination without abnormal findings: Secondary | ICD-10-CM | POA: Diagnosis not present

## 2017-11-15 DIAGNOSIS — E113291 Type 2 diabetes mellitus with mild nonproliferative diabetic retinopathy without macular edema, right eye: Secondary | ICD-10-CM | POA: Diagnosis not present

## 2017-11-15 DIAGNOSIS — I519 Heart disease, unspecified: Secondary | ICD-10-CM | POA: Diagnosis not present

## 2017-11-15 DIAGNOSIS — R269 Unspecified abnormalities of gait and mobility: Secondary | ICD-10-CM | POA: Diagnosis not present

## 2017-11-15 DIAGNOSIS — E7800 Pure hypercholesterolemia, unspecified: Secondary | ICD-10-CM | POA: Diagnosis not present

## 2017-11-15 DIAGNOSIS — N182 Chronic kidney disease, stage 2 (mild): Secondary | ICD-10-CM | POA: Diagnosis not present

## 2017-11-15 DIAGNOSIS — R569 Unspecified convulsions: Secondary | ICD-10-CM | POA: Diagnosis not present

## 2017-11-15 DIAGNOSIS — I10 Essential (primary) hypertension: Secondary | ICD-10-CM | POA: Diagnosis not present

## 2017-11-15 DIAGNOSIS — I639 Cerebral infarction, unspecified: Secondary | ICD-10-CM | POA: Diagnosis not present

## 2017-11-15 DIAGNOSIS — Z1231 Encounter for screening mammogram for malignant neoplasm of breast: Secondary | ICD-10-CM

## 2017-11-16 DIAGNOSIS — E113293 Type 2 diabetes mellitus with mild nonproliferative diabetic retinopathy without macular edema, bilateral: Secondary | ICD-10-CM | POA: Diagnosis not present

## 2017-12-07 ENCOUNTER — Ambulatory Visit
Admission: RE | Admit: 2017-12-07 | Discharge: 2017-12-07 | Disposition: A | Discharge status: Home / Self Care | Payer: Medicare HMO | Source: Ambulatory Visit | Attending: Internal Medicine | Admitting: Internal Medicine

## 2017-12-07 DIAGNOSIS — Z1231 Encounter for screening mammogram for malignant neoplasm of breast: Secondary | ICD-10-CM | POA: Diagnosis not present

## 2017-12-17 ENCOUNTER — Emergency Department (HOSPITAL_COMMUNITY): Payer: Medicare HMO

## 2017-12-17 ENCOUNTER — Emergency Department (HOSPITAL_COMMUNITY)
Admission: EM | Admit: 2017-12-17 | Discharge: 2017-12-17 | Disposition: A | Discharge status: Home / Self Care | Payer: Medicare HMO | Attending: Emergency Medicine | Admitting: Emergency Medicine

## 2017-12-17 ENCOUNTER — Encounter (HOSPITAL_COMMUNITY): Payer: Self-pay

## 2017-12-17 DIAGNOSIS — M79605 Pain in left leg: Secondary | ICD-10-CM | POA: Diagnosis not present

## 2017-12-17 DIAGNOSIS — E119 Type 2 diabetes mellitus without complications: Secondary | ICD-10-CM | POA: Diagnosis not present

## 2017-12-17 DIAGNOSIS — S8992XA Unspecified injury of left lower leg, initial encounter: Secondary | ICD-10-CM | POA: Diagnosis not present

## 2017-12-17 DIAGNOSIS — Z79899 Other long term (current) drug therapy: Secondary | ICD-10-CM | POA: Diagnosis not present

## 2017-12-17 DIAGNOSIS — M79662 Pain in left lower leg: Secondary | ICD-10-CM | POA: Diagnosis not present

## 2017-12-17 DIAGNOSIS — I10 Essential (primary) hypertension: Secondary | ICD-10-CM | POA: Diagnosis not present

## 2017-12-17 DIAGNOSIS — S79922A Unspecified injury of left thigh, initial encounter: Secondary | ICD-10-CM | POA: Diagnosis not present

## 2017-12-17 DIAGNOSIS — S3993XA Unspecified injury of pelvis, initial encounter: Secondary | ICD-10-CM | POA: Diagnosis not present

## 2017-12-17 DIAGNOSIS — W19XXXA Unspecified fall, initial encounter: Secondary | ICD-10-CM

## 2017-12-17 MED ORDER — ACETAMINOPHEN 500 MG PO TABS: 1000.0000 mg | ORAL_TABLET | Freq: Four times a day (QID) | ORAL | 0 refills | Status: DC | PRN

## 2017-12-17 MED ORDER — ACETAMINOPHEN 500 MG PO TABS
1000.0000 mg | ORAL_TABLET | Freq: Once | ORAL | Status: AC
  Administered 2017-12-17: 1000 mg via ORAL
  Filled 2017-12-17: qty 2

## 2017-12-17 NOTE — ED Notes (Signed)
Patient transported to X-ray 

## 2017-12-17 NOTE — ED Provider Notes (Addendum)
[facility] EMERGENCY DEPARTMENT Provider Note   CSN: [id number] Arrival date & time: 12/17/17  1149     History   Chief Complaint Chief Complaint  Patient presents with  . Leg Pain    HPI [name] is a 77 y.o. female.  HPI Patient was sitting at the table with her caregiver.  Her caregiver reports that she slid out of the chair fairly gently and went onto her buttocks on the floor.  She denies she struck her head or appear to have other injury.  She reports that they were able to get help and get her back up and walking with her walker.  Therefore she has been bearing weight appropriately.  Patient indicates that her pain is in her lower leg she points to her mid tibia.  He denies chest pain, back pain, headache.  Caregivers reports that patient has been at baseline without signs of acute illness. Past Medical History:  Diagnosis Date  . Diabetes mellitus   . Hypercholesteremia   . Hypertension   . Stroke [facility])     Patient Active Problem List   Diagnosis Date Noted  . Rhabdomyolysis 01/13/2015  . HLD (hyperlipidemia) 01/13/2015  . Diabetes type 2, controlled (HCC) 01/13/2015  . Faintness   . CVA (cerebral infarction) 10/25/2011  . UTI (lower urinary tract infection) 10/25/2011  . Dehydration 10/25/2011  . Syncope 10/23/2011  . H/O: CVA (cerebrovascular accident) 10/23/2011  . Hypertension 10/23/2011  . Diabetes mellitus (HCC) 10/23/2011    Past Surgical History:  Procedure Laterality Date  . TOOTH EXTRACTION       OB History   None      Home Medications    Prior to Admission medications   Medication Sig Start Date End Date Taking? Authorizing Provider  acetaminophen (TYLENOL) 500 MG tablet Take 2 tablets (1,000 mg total) by mouth every 6 (six) hours as needed. 12/17/17   Arby Barrette, MD  bisacodyl (DULCOLAX) 5 MG EC tablet Take 5 mg by mouth daily as needed for mild constipation.     [provider]  Cholecalciferol  (VITAMIN D-3 PO) Take 1 capsule by mouth daily.    [provider]  dipyridamole-aspirin (AGGRENOX) 25-200 MG per 12 hr capsule Take 1 capsule by mouth 2 (two) times daily.    [provider]  furosemide (LASIX) 20 MG tablet Take 20 mg by mouth every Monday, Wednesday, and Friday.  12/13/16   [provider]  hydrALAZINE (APRESOLINE) 25 MG tablet Take 25 mg by mouth 2 (two) times daily.  10/22/16   [provider]  hydrALAZINE (APRESOLINE) 50 MG tablet Take 1 tablet (50 mg total) by mouth every 8 (eight) hours. 01/16/15   Joseph Art, DO  KLOR-CON M10 10 MEQ tablet Take 10 mEq by mouth every Monday, Wednesday, and Friday.  04/27/17   [provider]  latanoprost (XALATAN) 0.005 % ophthalmic solution Place 1 drop into both eyes at bedtime. Reported on 08/04/2015 12/07/14   [provider]  levETIRAcetam (KEPPRA) 500 MG tablet Take 1 tablet (500 mg total) by mouth 2 (two) times daily. 01/16/15   Joseph Art, DO  lisinopril (PRINIVIL,ZESTRIL) 20 MG tablet Take 20 mg by mouth 2 (two) times daily.    [provider]  rosuvastatin (CRESTOR) 5 MG tablet Take 5 mg by mouth daily.  08/22/16   [provider]    Family History Family History  Problem Relation Age of Onset  . Diabetes type  II Unknown   . Hypertension Unknown     Social History Social History   Tobacco Use  . Smoking status: Never Smoker  . Smokeless tobacco: Never Used  Substance Use Topics  . Alcohol use: No  . Drug use: No     Allergies   Patient has no known allergies.   Review of Systems Review of Systems 10 Systems reviewed and are negative for acute change except as noted in the HPI.   Physical Exam Updated Vital Signs BP (!) 127/58   Pulse 83   Temp 98.7 F (37.1 C) (Oral)   Resp 19   SpO2 97%   Physical Exam  Constitutional: She is oriented to person, place, and time. She appears well-developed and well-nourished. No distress.    HENT:  Head: Normocephalic and atraumatic.  Mouth/Throat: Oropharynx is clear and moist.  Eyes: EOM are normal.  Neck: Neck supple.  Cardiovascular: Normal rate, regular rhythm, normal heart sounds and intact distal pulses.  Pulmonary/Chest: Effort normal and breath sounds normal. She exhibits no tenderness.  Abdominal: Soft. She exhibits no distension and no mass. There is no tenderness. There is no guarding.  Musculoskeletal: Normal range of motion. She exhibits tenderness. She exhibits no deformity.  She has been put through flexion and extension range of motion bilateral lower extremities.  Patient pushes away with full strength right lower extremity.  She can push me back with left lower extremity as well but reports that it is somewhat painful.  She indicates the pain is in her mid tibia area where there are some approximately 1 to 2 cm erythematous contusions.  Patient is neurovascularly intact.  Neurological: She is alert and oriented to person, place, and time. She exhibits normal muscle tone. Coordination normal.  Skin: Skin is warm and dry.  Psychiatric: She has a normal mood and affect.     ED Treatments / Results  Labs (all labs ordered are listed, but only abnormal results are displayed) Labs Reviewed - No data to display  EKG None  Radiology Dg Pelvis 1-2 Views  Addendum Date: 12/17/2017   ADDENDUM REPORT: 12/17/2017 13:23 ADDENDUM: Voice recognition error: Findings should: Hips are located. No femoral neck fracture. No pelvic fracture or sacral fracture. Atherosclerotic vascular calcifications. Electronically Signed   By: Genevive Bi M.D.   On: 12/17/2017 13:23   Result Date: 12/17/2017 CLINICAL DATA:  Per patient fall yesterday, patient reports general left leg pain that radiates down to her ankle. EXAM: PELVIS - 1-2 VIEW COMPARISON:  None. FINDINGS: Insert located. New femoral neck fracture. No pelvic fracture sacral fracture. Atherosclerotic vascular  calcifications. IMPRESSION: No pelvic fracture Electronically Signed: By: Genevive Bi M.D. On: 12/17/2017 13:03   Dg Tibia/fibula Left  Result Date: 12/17/2017 CLINICAL DATA:  Per patient fall yesterday, patient reports general left leg pain that radiates down to her ankle. EXAM: LEFT TIBIA AND FIBULA - 2 VIEW COMPARISON:  None. FINDINGS: No fracture of the tibia or fibula. Knee joint and ankle joint appear normal on two views. IMPRESSION: No fracture or dislocation.  The Electronically Signed   By: Genevive Bi M.D.   On: 12/17/2017 13:02   Dg Femur Min 2 Views Left  Result Date: 12/17/2017 CLINICAL DATA:  Per patient fall yesterday, patient reports general left leg pain that radiates down to her ankle. EXAM: LEFT FEMUR 2 VIEWS COMPARISON:  None. FINDINGS: The LEFT hip is located. New femoral neck fracture. No distal femur fracture. Degenerate changes of the LEFT knee.  IMPRESSION: No fracture or dislocation. Electronically Signed   By: Genevive Bi M.D.   On: 12/17/2017 13:04    Procedures Procedures (including critical care time)  Medications Ordered in ED Medications  acetaminophen (TYLENOL) tablet 1,000 mg (1,000 mg Oral Given 12/17/17 1333)     Initial Impression / Assessment and Plan / ED Course  I have reviewed the triage vital signs and the nursing notes.  Pertinent labs & imaging results that were available during my care of the patient were reviewed by me and considered in my medical decision making (see chart for details).    Attempted ambulation with 2 nurses and walker.  Patient had difficulty bearing weight.  She however again with weightbearing localizes the pain to the medial lower tibial and ankle.  He denies that the problem is hip or back.  At this time with negative fractures and exam not suggestive of significant contusion or strain with any effusion or significant soft tissue changes, will have the patient with conservative measures at home.  Her companions  reports that they have adequate help at home to help her with transfers and home care.  Final Clinical Impressions(s) / ED Diagnoses   Final diagnoses:  Fall, initial encounter  Left leg pain   Caregiver describes a very gentle fall, essentially sliding slowly from the patient's chair onto the floor.  Mechanism for injury seems low.  X-rays do not show acute fracture.  At this time we will proceed with conservative care with Tylenol icing and rest.  Return precautions reviewed.  ED Discharge Orders        Ordered    acetaminophen (TYLENOL) 500 MG tablet  Every 6 hours PRN     12/17/17 1336       Arby Barrette, MD 12/17/17 1341    Arby Barrette, MD 12/17/17 1355

## 2017-12-17 NOTE — ED Notes (Signed)
Pt states she fell out of a chair while sitting in the kitchen

## 2017-12-17 NOTE — Progress Notes (Signed)
Orthopedic Tech Progress Note Patient Details:  [name] Nov 17, 1940 [id number]  Ortho Devices Type of Ortho Device: ASO Ortho Device/Splint Location: lle Ortho Device/Splint Interventions: Application   Post Interventions Patient Tolerated: Well Instructions Provided: Care of device   Nikki Dom 12/17/2017, 2:23 PM

## 2017-12-17 NOTE — ED Notes (Signed)
Pt unable to ambulate; MD at bedside

## 2017-12-17 NOTE — ED Triage Notes (Signed)
Pt presents for evaluation of L leg pain since yesterday. Family reports that she slid out of a car yesterday and now does not want to bear weight on L leg. Hx of R sided stroke. Pt typically uses walker.

## 2017-12-17 NOTE — ED Notes (Signed)
Pt returned from xray

## 2018-01-10 ENCOUNTER — Ambulatory Visit: Payer: Medicare HMO | Admitting: Podiatry

## 2018-01-10 ENCOUNTER — Encounter: Payer: Self-pay | Admitting: Podiatry

## 2018-01-10 DIAGNOSIS — B351 Tinea unguium: Secondary | ICD-10-CM

## 2018-01-10 DIAGNOSIS — M79674 Pain in right toe(s): Secondary | ICD-10-CM | POA: Diagnosis not present

## 2018-01-10 DIAGNOSIS — E1151 Type 2 diabetes mellitus with diabetic peripheral angiopathy without gangrene: Secondary | ICD-10-CM | POA: Diagnosis not present

## 2018-01-10 DIAGNOSIS — M79675 Pain in left toe(s): Secondary | ICD-10-CM

## 2018-01-10 NOTE — Progress Notes (Signed)
Complaint:  Visit Type: Patient returns to my office for continued preventative foot care services. Complaint: Patient states" my nails have grown long and thick and become painful to walk and wear shoes" Patient has been diagnosed with DM with no foot complications. . The patient presents for preventative foot care services. No changes to ROS  Podiatric Exam: Vascular: dorsalis pedis and posterior tibial pulses are palpable bilateral. Capillary return is immediate. Temperature gradient is WNL. Skin turgor WNL  Sensorium: Normal Semmes Weinstein monofilament test. Normal tactile sensation bilaterally. Nail Exam: Pt has thick disfigured discolored nails with subungual debris noted bilateral entire nail hallux through fifth toenails Ulcer Exam: There is no evidence of ulcer or pre-ulcerative changes or infection. Orthopedic Exam: Muscle tone and strength are WNL. No limitations in general ROM. No crepitus or effusions noted. Foot type and digits show no abnormalities. HAV right greater than left. Skin: No Porokeratosis. No infection or ulcers  Diagnosis:  Onychomycosis, , Pain in right toe, pain in left toes  Treatment & Plan Procedures and Treatment: Consent by patient was obtained for treatment procedures.   Debridement of mycotic and hypertrophic toenails, 1 through 5 bilateral and clearing of subungual debris. No ulceration, no infection noted.  Return Visit-Office Procedure: Patient instructed to return to the office for a follow up visit 3 months for continued evaluation and treatment.    Malerie Eakins DPM 

## 2018-04-11 ENCOUNTER — Ambulatory Visit: Payer: Medicare HMO | Admitting: Podiatry

## 2018-04-20 ENCOUNTER — Encounter: Payer: Self-pay | Admitting: Podiatry

## 2018-04-20 ENCOUNTER — Ambulatory Visit: Payer: Medicare HMO | Admitting: Podiatry

## 2018-04-20 DIAGNOSIS — B351 Tinea unguium: Secondary | ICD-10-CM | POA: Diagnosis not present

## 2018-04-20 DIAGNOSIS — M79674 Pain in right toe(s): Secondary | ICD-10-CM | POA: Diagnosis not present

## 2018-04-20 DIAGNOSIS — M79675 Pain in left toe(s): Secondary | ICD-10-CM

## 2018-04-20 NOTE — Patient Instructions (Signed)

## 2018-05-08 NOTE — Progress Notes (Signed)
Subjective: [name] presents today with painful, thick toenails 1-5 b/l that she cannot cut and which interfere with daily activities.  Pain is aggravated when wearing enclosed shoe gear.   Current Outpatient Medications:  .  acetaminophen (TYLENOL) 500 MG tablet, Take 2 tablets (1,000 mg total) by mouth every 6 (six) hours as needed., Disp: 30 tablet, Rfl: 0 .  bisacodyl (DULCOLAX) 5 MG EC tablet, Take 5 mg by mouth daily as needed for mild constipation. , Disp: , Rfl:  .  Cholecalciferol (VITAMIN D-3 PO), Take 1 capsule by mouth daily., Disp: , Rfl:  .  dipyridamole-aspirin (AGGRENOX) 25-200 MG per 12 hr capsule, Take 1 capsule by mouth 2 (two) times daily., Disp: , Rfl:  .  furosemide (LASIX) 20 MG tablet, Take 20 mg by mouth every Monday, Wednesday, and Friday. , Disp: , Rfl:  .  hydrALAZINE (APRESOLINE) 25 MG tablet, Take 25 mg by mouth 2 (two) times daily., Disp: , Rfl: 5 .  hydrALAZINE (APRESOLINE) 50 MG tablet, Take 1 tablet (50 mg total) by mouth every 8 (eight) hours., Disp: , Rfl:  .  KLOR-CON M10 10 MEQ tablet, Take 10 mEq by mouth every Monday, Wednesday, and Friday. , Disp: , Rfl:  .  latanoprost (XALATAN) 0.005 % ophthalmic solution, Place 1 drop into both eyes at bedtime. Reported on 08/04/2015, Disp: , Rfl:  .  levETIRAcetam (KEPPRA) 500 MG tablet, Take 1 tablet (500 mg total) by mouth 2 (two) times daily., Disp: , Rfl:  .  lisinopril (PRINIVIL,ZESTRIL) 20 MG tablet, Take 20 mg by mouth 2 (two) times daily., Disp: , Rfl:  .  oxybutynin (DITROPAN) 5 MG tablet, Take 5 mg by mouth daily., Disp: , Rfl: 5 .  rosuvastatin (CRESTOR) 5 MG tablet, Take 5 mg by mouth daily. , Disp: , Rfl:   No Known Allergies  Objective:  Vascular Examination: Capillary refill time immediate x 10 digits Dorsalis pedis and Posterior tibial pulses palpable b/l Digital hair present x 10 digits Skin temperature gradient WNL b/l  Dermatological Examination: Skin with normal turgor, texture and  tone b/l  Toenails 1-5 b/l discolored, thick, dystrophic with subungual debris and pain with palpation to nailbeds due to thickness of nails.  Musculoskeletal: Muscle strength 5/5 to all LE muscle groups  HAV right >left  Neurological: Sensation intact with 10 gram monofilament. Vibratory sensation intact.  Assessment: Painful onychomycosis toenails 1-5 b/l   Plan: 1. Toenails 1-5 b/l were debrided in length and girth without iatrogenic bleeding. 2. Patient to continue soft, supportive shoe gear 3. Patient to report any pedal injuries to medical professional immediately. 4. Follow up 3 months. Patient/POA to call should there be a concern in the interim.

## 2018-05-17 DIAGNOSIS — E7800 Pure hypercholesterolemia, unspecified: Secondary | ICD-10-CM | POA: Diagnosis not present

## 2018-05-17 DIAGNOSIS — E113299 Type 2 diabetes mellitus with mild nonproliferative diabetic retinopathy without macular edema, unspecified eye: Secondary | ICD-10-CM | POA: Diagnosis not present

## 2018-05-17 DIAGNOSIS — I639 Cerebral infarction, unspecified: Secondary | ICD-10-CM | POA: Diagnosis not present

## 2018-05-17 DIAGNOSIS — Z23 Encounter for immunization: Secondary | ICD-10-CM | POA: Diagnosis not present

## 2018-05-17 DIAGNOSIS — E1165 Type 2 diabetes mellitus with hyperglycemia: Secondary | ICD-10-CM | POA: Diagnosis not present

## 2018-05-17 DIAGNOSIS — I10 Essential (primary) hypertension: Secondary | ICD-10-CM | POA: Diagnosis not present

## 2018-05-17 DIAGNOSIS — M6289 Other specified disorders of muscle: Secondary | ICD-10-CM | POA: Diagnosis not present

## 2018-05-17 DIAGNOSIS — R569 Unspecified convulsions: Secondary | ICD-10-CM | POA: Diagnosis not present

## 2018-05-17 DIAGNOSIS — N182 Chronic kidney disease, stage 2 (mild): Secondary | ICD-10-CM | POA: Diagnosis not present

## 2018-06-28 DIAGNOSIS — M1990 Unspecified osteoarthritis, unspecified site: Secondary | ICD-10-CM | POA: Diagnosis not present

## 2018-06-28 DIAGNOSIS — N182 Chronic kidney disease, stage 2 (mild): Secondary | ICD-10-CM | POA: Diagnosis not present

## 2018-06-28 DIAGNOSIS — H409 Unspecified glaucoma: Secondary | ICD-10-CM | POA: Diagnosis not present

## 2018-06-28 DIAGNOSIS — I10 Essential (primary) hypertension: Secondary | ICD-10-CM | POA: Diagnosis not present

## 2018-06-28 DIAGNOSIS — E113299 Type 2 diabetes mellitus with mild nonproliferative diabetic retinopathy without macular edema, unspecified eye: Secondary | ICD-10-CM | POA: Diagnosis not present

## 2018-06-28 DIAGNOSIS — I639 Cerebral infarction, unspecified: Secondary | ICD-10-CM | POA: Diagnosis not present

## 2018-07-10 DIAGNOSIS — H409 Unspecified glaucoma: Secondary | ICD-10-CM | POA: Diagnosis not present

## 2018-07-10 DIAGNOSIS — N182 Chronic kidney disease, stage 2 (mild): Secondary | ICD-10-CM | POA: Diagnosis not present

## 2018-07-10 DIAGNOSIS — E113299 Type 2 diabetes mellitus with mild nonproliferative diabetic retinopathy without macular edema, unspecified eye: Secondary | ICD-10-CM | POA: Diagnosis not present

## 2018-07-10 DIAGNOSIS — I639 Cerebral infarction, unspecified: Secondary | ICD-10-CM | POA: Diagnosis not present

## 2018-07-10 DIAGNOSIS — I10 Essential (primary) hypertension: Secondary | ICD-10-CM | POA: Diagnosis not present

## 2018-07-10 DIAGNOSIS — M1990 Unspecified osteoarthritis, unspecified site: Secondary | ICD-10-CM | POA: Diagnosis not present

## 2018-07-19 ENCOUNTER — Ambulatory Visit: Payer: Medicare HMO | Admitting: Podiatry

## 2018-07-19 ENCOUNTER — Encounter: Payer: Self-pay | Admitting: Podiatry

## 2018-07-19 DIAGNOSIS — E1151 Type 2 diabetes mellitus with diabetic peripheral angiopathy without gangrene: Secondary | ICD-10-CM

## 2018-07-19 DIAGNOSIS — M79674 Pain in right toe(s): Secondary | ICD-10-CM | POA: Diagnosis not present

## 2018-07-19 DIAGNOSIS — M79675 Pain in left toe(s): Secondary | ICD-10-CM | POA: Diagnosis not present

## 2018-07-19 DIAGNOSIS — B351 Tinea unguium: Secondary | ICD-10-CM | POA: Diagnosis not present

## 2018-07-19 NOTE — Progress Notes (Signed)
Complaint:  Visit Type: Patient returns to my office for continued preventative foot care services. Complaint: Patient states" my nails have grown long and thick and become painful to walk and wear shoes" Patient has been diagnosed with DM with no foot complications. . The patient presents for preventative foot care services. No changes to ROS  Podiatric Exam: Vascular: dorsalis pedis and posterior tibial pulses are palpable bilateral. Capillary return is immediate. Temperature gradient is WNL. Skin turgor WNL  Sensorium: Normal Semmes Weinstein monofilament test. Normal tactile sensation bilaterally. Nail Exam: Pt has thick disfigured discolored nails with subungual debris noted bilateral entire nail hallux through fifth toenails Ulcer Exam: There is no evidence of ulcer or pre-ulcerative changes or infection. Orthopedic Exam: Muscle tone and strength are WNL. No limitations in general ROM. No crepitus or effusions noted. Foot type and digits show no abnormalities. HAV right greater than left. Skin: No Porokeratosis. No infection or ulcers  Diagnosis:  Onychomycosis, , Pain in right toe, pain in left toes  Treatment & Plan Procedures and Treatment: Consent by patient was obtained for treatment procedures.   Debridement of mycotic and hypertrophic toenails, 1 through 5 bilateral and clearing of subungual debris. No ulceration, no infection noted.  Return Visit-Office Procedure: Patient instructed to return to the office for a follow up visit 3 months for continued evaluation and treatment.    Helane Gunther DPM

## 2018-08-09 DIAGNOSIS — H25013 Cortical age-related cataract, bilateral: Secondary | ICD-10-CM | POA: Diagnosis not present

## 2018-08-09 DIAGNOSIS — E113291 Type 2 diabetes mellitus with mild nonproliferative diabetic retinopathy without macular edema, right eye: Secondary | ICD-10-CM | POA: Diagnosis not present

## 2018-08-09 DIAGNOSIS — H524 Presbyopia: Secondary | ICD-10-CM | POA: Diagnosis not present

## 2018-08-09 DIAGNOSIS — H401134 Primary open-angle glaucoma, bilateral, indeterminate stage: Secondary | ICD-10-CM | POA: Diagnosis not present

## 2018-08-22 DIAGNOSIS — H409 Unspecified glaucoma: Secondary | ICD-10-CM | POA: Diagnosis not present

## 2018-08-22 DIAGNOSIS — M1990 Unspecified osteoarthritis, unspecified site: Secondary | ICD-10-CM | POA: Diagnosis not present

## 2018-08-22 DIAGNOSIS — N182 Chronic kidney disease, stage 2 (mild): Secondary | ICD-10-CM | POA: Diagnosis not present

## 2018-08-22 DIAGNOSIS — I639 Cerebral infarction, unspecified: Secondary | ICD-10-CM | POA: Diagnosis not present

## 2018-08-22 DIAGNOSIS — E113299 Type 2 diabetes mellitus with mild nonproliferative diabetic retinopathy without macular edema, unspecified eye: Secondary | ICD-10-CM | POA: Diagnosis not present

## 2018-08-22 DIAGNOSIS — I10 Essential (primary) hypertension: Secondary | ICD-10-CM | POA: Diagnosis not present

## 2018-09-06 DIAGNOSIS — E7800 Pure hypercholesterolemia, unspecified: Secondary | ICD-10-CM | POA: Diagnosis not present

## 2018-09-06 DIAGNOSIS — I10 Essential (primary) hypertension: Secondary | ICD-10-CM | POA: Diagnosis not present

## 2018-09-06 DIAGNOSIS — M6289 Other specified disorders of muscle: Secondary | ICD-10-CM | POA: Diagnosis not present

## 2018-09-06 DIAGNOSIS — I519 Heart disease, unspecified: Secondary | ICD-10-CM | POA: Diagnosis not present

## 2018-09-06 DIAGNOSIS — R569 Unspecified convulsions: Secondary | ICD-10-CM | POA: Diagnosis not present

## 2018-09-06 DIAGNOSIS — I639 Cerebral infarction, unspecified: Secondary | ICD-10-CM | POA: Diagnosis not present

## 2018-09-06 DIAGNOSIS — E113299 Type 2 diabetes mellitus with mild nonproliferative diabetic retinopathy without macular edema, unspecified eye: Secondary | ICD-10-CM | POA: Diagnosis not present

## 2018-09-06 DIAGNOSIS — N182 Chronic kidney disease, stage 2 (mild): Secondary | ICD-10-CM | POA: Diagnosis not present

## 2018-09-22 DIAGNOSIS — E113299 Type 2 diabetes mellitus with mild nonproliferative diabetic retinopathy without macular edema, unspecified eye: Secondary | ICD-10-CM | POA: Diagnosis not present

## 2018-09-22 DIAGNOSIS — M1990 Unspecified osteoarthritis, unspecified site: Secondary | ICD-10-CM | POA: Diagnosis not present

## 2018-09-22 DIAGNOSIS — N182 Chronic kidney disease, stage 2 (mild): Secondary | ICD-10-CM | POA: Diagnosis not present

## 2018-09-22 DIAGNOSIS — I10 Essential (primary) hypertension: Secondary | ICD-10-CM | POA: Diagnosis not present

## 2018-09-22 DIAGNOSIS — I639 Cerebral infarction, unspecified: Secondary | ICD-10-CM | POA: Diagnosis not present

## 2018-09-22 DIAGNOSIS — H409 Unspecified glaucoma: Secondary | ICD-10-CM | POA: Diagnosis not present

## 2018-09-22 DIAGNOSIS — E7800 Pure hypercholesterolemia, unspecified: Secondary | ICD-10-CM | POA: Diagnosis not present

## 2018-10-18 ENCOUNTER — Ambulatory Visit: Payer: Medicare HMO | Admitting: Podiatry

## 2018-10-24 DIAGNOSIS — N182 Chronic kidney disease, stage 2 (mild): Secondary | ICD-10-CM | POA: Diagnosis not present

## 2018-10-24 DIAGNOSIS — I10 Essential (primary) hypertension: Secondary | ICD-10-CM | POA: Diagnosis not present

## 2018-10-24 DIAGNOSIS — E113293 Type 2 diabetes mellitus with mild nonproliferative diabetic retinopathy without macular edema, bilateral: Secondary | ICD-10-CM | POA: Diagnosis not present

## 2018-10-24 DIAGNOSIS — H409 Unspecified glaucoma: Secondary | ICD-10-CM | POA: Diagnosis not present

## 2018-10-24 DIAGNOSIS — E7800 Pure hypercholesterolemia, unspecified: Secondary | ICD-10-CM | POA: Diagnosis not present

## 2018-10-24 DIAGNOSIS — I639 Cerebral infarction, unspecified: Secondary | ICD-10-CM | POA: Diagnosis not present

## 2018-10-24 DIAGNOSIS — M1990 Unspecified osteoarthritis, unspecified site: Secondary | ICD-10-CM | POA: Diagnosis not present

## 2018-11-20 DIAGNOSIS — M1990 Unspecified osteoarthritis, unspecified site: Secondary | ICD-10-CM | POA: Diagnosis not present

## 2018-11-20 DIAGNOSIS — N182 Chronic kidney disease, stage 2 (mild): Secondary | ICD-10-CM | POA: Diagnosis not present

## 2018-11-20 DIAGNOSIS — E113299 Type 2 diabetes mellitus with mild nonproliferative diabetic retinopathy without macular edema, unspecified eye: Secondary | ICD-10-CM | POA: Diagnosis not present

## 2018-11-20 DIAGNOSIS — I10 Essential (primary) hypertension: Secondary | ICD-10-CM | POA: Diagnosis not present

## 2018-11-20 DIAGNOSIS — H409 Unspecified glaucoma: Secondary | ICD-10-CM | POA: Diagnosis not present

## 2018-11-20 DIAGNOSIS — E7800 Pure hypercholesterolemia, unspecified: Secondary | ICD-10-CM | POA: Diagnosis not present

## 2018-11-20 DIAGNOSIS — I639 Cerebral infarction, unspecified: Secondary | ICD-10-CM | POA: Diagnosis not present

## 2018-11-28 ENCOUNTER — Ambulatory Visit: Payer: Medicare HMO | Admitting: Podiatry

## 2018-11-28 ENCOUNTER — Other Ambulatory Visit: Payer: Self-pay

## 2018-11-28 ENCOUNTER — Encounter: Payer: Self-pay | Admitting: Podiatry

## 2018-11-28 DIAGNOSIS — E119 Type 2 diabetes mellitus without complications: Secondary | ICD-10-CM | POA: Diagnosis not present

## 2018-11-28 DIAGNOSIS — B351 Tinea unguium: Secondary | ICD-10-CM | POA: Diagnosis not present

## 2018-11-28 DIAGNOSIS — M79675 Pain in left toe(s): Secondary | ICD-10-CM | POA: Diagnosis not present

## 2018-11-28 DIAGNOSIS — M79674 Pain in right toe(s): Secondary | ICD-10-CM

## 2018-11-28 NOTE — Progress Notes (Signed)
Complaint:  Visit Type: Patient returns to my office for continued preventative foot care services. Complaint: Patient states" my nails have grown long and thick and become painful to walk and wear shoes" Patient has been diagnosed with DM with no foot complications. . The patient presents for preventative foot care services. No changes to ROS.  Patient presents to the office with a caregiver.  Podiatric Exam: Vascular: dorsalis pedis and posterior tibial pulses are palpable bilateral. Capillary return is immediate. Temperature gradient is WNL. Skin turgor WNL  Sensorium: Normal Semmes Weinstein monofilament test. Normal tactile sensation bilaterally. Nail Exam: Pt has thick disfigured discolored nails with subungual debris noted bilateral entire nail hallux through fifth toenails Ulcer Exam: There is no evidence of ulcer or pre-ulcerative changes or infection. Orthopedic Exam: Muscle tone and strength are WNL. No limitations in general ROM. No crepitus or effusions noted. Foot type and digits show no abnormalities. HAV right greater than left. Skin: No Porokeratosis. No infection or ulcers  Diagnosis:  Onychomycosis, , Pain in right toe, pain in left toes  Treatment & Plan Procedures and Treatment: Consent by patient was obtained for treatment procedures.   Debridement of mycotic and hypertrophic toenails, 1 through 5 bilateral and clearing of subungual debris. No ulceration, no infection noted.  Return Visit-Office Procedure: Patient instructed to return to the office for a follow up visit 4 months for continued evaluation and treatment.    Gardiner Barefoot DPM

## 2018-11-30 ENCOUNTER — Other Ambulatory Visit: Payer: Self-pay | Admitting: Internal Medicine

## 2018-11-30 DIAGNOSIS — Z1231 Encounter for screening mammogram for malignant neoplasm of breast: Secondary | ICD-10-CM

## 2018-12-06 DIAGNOSIS — H401134 Primary open-angle glaucoma, bilateral, indeterminate stage: Secondary | ICD-10-CM | POA: Diagnosis not present

## 2018-12-13 DIAGNOSIS — E113299 Type 2 diabetes mellitus with mild nonproliferative diabetic retinopathy without macular edema, unspecified eye: Secondary | ICD-10-CM | POA: Diagnosis not present

## 2018-12-13 DIAGNOSIS — E7800 Pure hypercholesterolemia, unspecified: Secondary | ICD-10-CM | POA: Diagnosis not present

## 2018-12-13 DIAGNOSIS — Z0000 Encounter for general adult medical examination without abnormal findings: Secondary | ICD-10-CM | POA: Diagnosis not present

## 2018-12-13 DIAGNOSIS — I10 Essential (primary) hypertension: Secondary | ICD-10-CM | POA: Diagnosis not present

## 2018-12-13 DIAGNOSIS — I639 Cerebral infarction, unspecified: Secondary | ICD-10-CM | POA: Diagnosis not present

## 2018-12-13 DIAGNOSIS — Z1389 Encounter for screening for other disorder: Secondary | ICD-10-CM | POA: Diagnosis not present

## 2018-12-13 DIAGNOSIS — E119 Type 2 diabetes mellitus without complications: Secondary | ICD-10-CM | POA: Diagnosis not present

## 2018-12-13 DIAGNOSIS — G8191 Hemiplegia, unspecified affecting right dominant side: Secondary | ICD-10-CM | POA: Diagnosis not present

## 2018-12-13 DIAGNOSIS — R569 Unspecified convulsions: Secondary | ICD-10-CM | POA: Diagnosis not present

## 2018-12-26 DIAGNOSIS — E7800 Pure hypercholesterolemia, unspecified: Secondary | ICD-10-CM | POA: Diagnosis not present

## 2018-12-26 DIAGNOSIS — I639 Cerebral infarction, unspecified: Secondary | ICD-10-CM | POA: Diagnosis not present

## 2018-12-26 DIAGNOSIS — H409 Unspecified glaucoma: Secondary | ICD-10-CM | POA: Diagnosis not present

## 2018-12-26 DIAGNOSIS — M1990 Unspecified osteoarthritis, unspecified site: Secondary | ICD-10-CM | POA: Diagnosis not present

## 2018-12-26 DIAGNOSIS — N182 Chronic kidney disease, stage 2 (mild): Secondary | ICD-10-CM | POA: Diagnosis not present

## 2018-12-26 DIAGNOSIS — E113299 Type 2 diabetes mellitus with mild nonproliferative diabetic retinopathy without macular edema, unspecified eye: Secondary | ICD-10-CM | POA: Diagnosis not present

## 2018-12-26 DIAGNOSIS — I10 Essential (primary) hypertension: Secondary | ICD-10-CM | POA: Diagnosis not present

## 2018-12-27 DIAGNOSIS — I10 Essential (primary) hypertension: Secondary | ICD-10-CM | POA: Diagnosis not present

## 2018-12-27 DIAGNOSIS — E113299 Type 2 diabetes mellitus with mild nonproliferative diabetic retinopathy without macular edema, unspecified eye: Secondary | ICD-10-CM | POA: Diagnosis not present

## 2018-12-27 DIAGNOSIS — E119 Type 2 diabetes mellitus without complications: Secondary | ICD-10-CM | POA: Diagnosis not present

## 2018-12-27 DIAGNOSIS — E7800 Pure hypercholesterolemia, unspecified: Secondary | ICD-10-CM | POA: Diagnosis not present

## 2019-01-09 DIAGNOSIS — I10 Essential (primary) hypertension: Secondary | ICD-10-CM | POA: Diagnosis not present

## 2019-01-09 DIAGNOSIS — G4700 Insomnia, unspecified: Secondary | ICD-10-CM | POA: Diagnosis not present

## 2019-01-09 DIAGNOSIS — I639 Cerebral infarction, unspecified: Secondary | ICD-10-CM | POA: Diagnosis not present

## 2019-01-09 DIAGNOSIS — E7800 Pure hypercholesterolemia, unspecified: Secondary | ICD-10-CM | POA: Diagnosis not present

## 2019-01-09 DIAGNOSIS — E119 Type 2 diabetes mellitus without complications: Secondary | ICD-10-CM | POA: Diagnosis not present

## 2019-01-09 DIAGNOSIS — G8191 Hemiplegia, unspecified affecting right dominant side: Secondary | ICD-10-CM | POA: Diagnosis not present

## 2019-01-09 DIAGNOSIS — R569 Unspecified convulsions: Secondary | ICD-10-CM | POA: Diagnosis not present

## 2019-01-09 DIAGNOSIS — E113299 Type 2 diabetes mellitus with mild nonproliferative diabetic retinopathy without macular edema, unspecified eye: Secondary | ICD-10-CM | POA: Diagnosis not present

## 2019-01-15 ENCOUNTER — Ambulatory Visit: Payer: Medicare HMO

## 2019-01-17 DIAGNOSIS — R269 Unspecified abnormalities of gait and mobility: Secondary | ICD-10-CM | POA: Diagnosis not present

## 2019-01-18 ENCOUNTER — Ambulatory Visit
Admission: RE | Admit: 2019-01-18 | Discharge: 2019-01-18 | Disposition: A | Discharge status: Home / Self Care | Payer: Medicare HMO | Source: Ambulatory Visit | Attending: Internal Medicine | Admitting: Internal Medicine

## 2019-01-18 ENCOUNTER — Other Ambulatory Visit: Payer: Self-pay

## 2019-01-18 DIAGNOSIS — Z1231 Encounter for screening mammogram for malignant neoplasm of breast: Secondary | ICD-10-CM

## 2019-01-29 DIAGNOSIS — E7800 Pure hypercholesterolemia, unspecified: Secondary | ICD-10-CM | POA: Diagnosis not present

## 2019-01-29 DIAGNOSIS — H409 Unspecified glaucoma: Secondary | ICD-10-CM | POA: Diagnosis not present

## 2019-01-29 DIAGNOSIS — I639 Cerebral infarction, unspecified: Secondary | ICD-10-CM | POA: Diagnosis not present

## 2019-01-29 DIAGNOSIS — E113299 Type 2 diabetes mellitus with mild nonproliferative diabetic retinopathy without macular edema, unspecified eye: Secondary | ICD-10-CM | POA: Diagnosis not present

## 2019-01-29 DIAGNOSIS — N182 Chronic kidney disease, stage 2 (mild): Secondary | ICD-10-CM | POA: Diagnosis not present

## 2019-01-29 DIAGNOSIS — M1990 Unspecified osteoarthritis, unspecified site: Secondary | ICD-10-CM | POA: Diagnosis not present

## 2019-01-29 DIAGNOSIS — I10 Essential (primary) hypertension: Secondary | ICD-10-CM | POA: Diagnosis not present

## 2019-02-13 DIAGNOSIS — I10 Essential (primary) hypertension: Secondary | ICD-10-CM | POA: Diagnosis not present

## 2019-02-20 DIAGNOSIS — H409 Unspecified glaucoma: Secondary | ICD-10-CM | POA: Diagnosis not present

## 2019-02-20 DIAGNOSIS — I10 Essential (primary) hypertension: Secondary | ICD-10-CM | POA: Diagnosis not present

## 2019-02-20 DIAGNOSIS — N182 Chronic kidney disease, stage 2 (mild): Secondary | ICD-10-CM | POA: Diagnosis not present

## 2019-02-20 DIAGNOSIS — M1990 Unspecified osteoarthritis, unspecified site: Secondary | ICD-10-CM | POA: Diagnosis not present

## 2019-02-20 DIAGNOSIS — E7800 Pure hypercholesterolemia, unspecified: Secondary | ICD-10-CM | POA: Diagnosis not present

## 2019-02-20 DIAGNOSIS — I639 Cerebral infarction, unspecified: Secondary | ICD-10-CM | POA: Diagnosis not present

## 2019-02-20 DIAGNOSIS — E11319 Type 2 diabetes mellitus with unspecified diabetic retinopathy without macular edema: Secondary | ICD-10-CM | POA: Diagnosis not present

## 2019-03-14 DIAGNOSIS — I639 Cerebral infarction, unspecified: Secondary | ICD-10-CM | POA: Diagnosis not present

## 2019-03-14 DIAGNOSIS — E11319 Type 2 diabetes mellitus with unspecified diabetic retinopathy without macular edema: Secondary | ICD-10-CM | POA: Diagnosis not present

## 2019-03-14 DIAGNOSIS — H409 Unspecified glaucoma: Secondary | ICD-10-CM | POA: Diagnosis not present

## 2019-03-14 DIAGNOSIS — M1990 Unspecified osteoarthritis, unspecified site: Secondary | ICD-10-CM | POA: Diagnosis not present

## 2019-03-14 DIAGNOSIS — N182 Chronic kidney disease, stage 2 (mild): Secondary | ICD-10-CM | POA: Diagnosis not present

## 2019-03-14 DIAGNOSIS — E7800 Pure hypercholesterolemia, unspecified: Secondary | ICD-10-CM | POA: Diagnosis not present

## 2019-03-14 DIAGNOSIS — I10 Essential (primary) hypertension: Secondary | ICD-10-CM | POA: Diagnosis not present

## 2019-03-28 DIAGNOSIS — M6289 Other specified disorders of muscle: Secondary | ICD-10-CM | POA: Diagnosis not present

## 2019-03-28 DIAGNOSIS — M1990 Unspecified osteoarthritis, unspecified site: Secondary | ICD-10-CM | POA: Diagnosis not present

## 2019-03-28 DIAGNOSIS — I639 Cerebral infarction, unspecified: Secondary | ICD-10-CM | POA: Diagnosis not present

## 2019-04-03 ENCOUNTER — Ambulatory Visit: Payer: Medicare HMO | Admitting: Podiatry

## 2019-04-23 DIAGNOSIS — R269 Unspecified abnormalities of gait and mobility: Secondary | ICD-10-CM | POA: Diagnosis not present

## 2019-04-23 DIAGNOSIS — G8191 Hemiplegia, unspecified affecting right dominant side: Secondary | ICD-10-CM | POA: Diagnosis not present

## 2019-04-23 DIAGNOSIS — E7800 Pure hypercholesterolemia, unspecified: Secondary | ICD-10-CM | POA: Diagnosis not present

## 2019-04-23 DIAGNOSIS — I519 Heart disease, unspecified: Secondary | ICD-10-CM | POA: Diagnosis not present

## 2019-04-23 DIAGNOSIS — I639 Cerebral infarction, unspecified: Secondary | ICD-10-CM | POA: Diagnosis not present

## 2019-04-23 DIAGNOSIS — R569 Unspecified convulsions: Secondary | ICD-10-CM | POA: Diagnosis not present

## 2019-04-23 DIAGNOSIS — I10 Essential (primary) hypertension: Secondary | ICD-10-CM | POA: Diagnosis not present

## 2019-04-25 DIAGNOSIS — M1990 Unspecified osteoarthritis, unspecified site: Secondary | ICD-10-CM | POA: Diagnosis not present

## 2019-04-25 DIAGNOSIS — E7800 Pure hypercholesterolemia, unspecified: Secondary | ICD-10-CM | POA: Diagnosis not present

## 2019-04-25 DIAGNOSIS — N182 Chronic kidney disease, stage 2 (mild): Secondary | ICD-10-CM | POA: Diagnosis not present

## 2019-04-25 DIAGNOSIS — I10 Essential (primary) hypertension: Secondary | ICD-10-CM | POA: Diagnosis not present

## 2019-04-25 DIAGNOSIS — E11319 Type 2 diabetes mellitus with unspecified diabetic retinopathy without macular edema: Secondary | ICD-10-CM | POA: Diagnosis not present

## 2019-04-25 DIAGNOSIS — H409 Unspecified glaucoma: Secondary | ICD-10-CM | POA: Diagnosis not present

## 2019-04-25 DIAGNOSIS — I639 Cerebral infarction, unspecified: Secondary | ICD-10-CM | POA: Diagnosis not present

## 2019-05-15 ENCOUNTER — Ambulatory Visit: Payer: Medicare HMO | Admitting: Podiatry

## 2019-05-29 DIAGNOSIS — I639 Cerebral infarction, unspecified: Secondary | ICD-10-CM | POA: Diagnosis not present

## 2019-05-29 DIAGNOSIS — E7800 Pure hypercholesterolemia, unspecified: Secondary | ICD-10-CM | POA: Diagnosis not present

## 2019-05-29 DIAGNOSIS — E11319 Type 2 diabetes mellitus with unspecified diabetic retinopathy without macular edema: Secondary | ICD-10-CM | POA: Diagnosis not present

## 2019-05-29 DIAGNOSIS — H409 Unspecified glaucoma: Secondary | ICD-10-CM | POA: Diagnosis not present

## 2019-05-29 DIAGNOSIS — M1990 Unspecified osteoarthritis, unspecified site: Secondary | ICD-10-CM | POA: Diagnosis not present

## 2019-05-29 DIAGNOSIS — I10 Essential (primary) hypertension: Secondary | ICD-10-CM | POA: Diagnosis not present

## 2019-05-29 DIAGNOSIS — N182 Chronic kidney disease, stage 2 (mild): Secondary | ICD-10-CM | POA: Diagnosis not present

## 2019-06-18 DIAGNOSIS — I639 Cerebral infarction, unspecified: Secondary | ICD-10-CM | POA: Diagnosis not present

## 2019-06-18 DIAGNOSIS — E11319 Type 2 diabetes mellitus with unspecified diabetic retinopathy without macular edema: Secondary | ICD-10-CM | POA: Diagnosis not present

## 2019-06-18 DIAGNOSIS — N182 Chronic kidney disease, stage 2 (mild): Secondary | ICD-10-CM | POA: Diagnosis not present

## 2019-06-18 DIAGNOSIS — I10 Essential (primary) hypertension: Secondary | ICD-10-CM | POA: Diagnosis not present

## 2019-06-18 DIAGNOSIS — E7800 Pure hypercholesterolemia, unspecified: Secondary | ICD-10-CM | POA: Diagnosis not present

## 2019-06-18 DIAGNOSIS — H409 Unspecified glaucoma: Secondary | ICD-10-CM | POA: Diagnosis not present

## 2019-06-18 DIAGNOSIS — M1990 Unspecified osteoarthritis, unspecified site: Secondary | ICD-10-CM | POA: Diagnosis not present

## 2019-07-03 ENCOUNTER — Other Ambulatory Visit: Payer: Self-pay | Admitting: Internal Medicine

## 2019-07-03 DIAGNOSIS — R531 Weakness: Secondary | ICD-10-CM | POA: Diagnosis not present

## 2019-07-03 DIAGNOSIS — G8191 Hemiplegia, unspecified affecting right dominant side: Secondary | ICD-10-CM | POA: Diagnosis not present

## 2019-07-03 DIAGNOSIS — R269 Unspecified abnormalities of gait and mobility: Secondary | ICD-10-CM | POA: Diagnosis not present

## 2019-07-08 ENCOUNTER — Other Ambulatory Visit: Payer: Self-pay

## 2019-07-08 ENCOUNTER — Emergency Department (HOSPITAL_COMMUNITY)
Admission: EM | Admit: 2019-07-08 | Discharge: 2019-07-08 | Disposition: A | Discharge status: Home / Self Care | Payer: Medicare HMO | Attending: Emergency Medicine | Admitting: Emergency Medicine

## 2019-07-08 DIAGNOSIS — R4182 Altered mental status, unspecified: Secondary | ICD-10-CM | POA: Diagnosis not present

## 2019-07-08 DIAGNOSIS — R112 Nausea with vomiting, unspecified: Secondary | ICD-10-CM | POA: Insufficient documentation

## 2019-07-08 DIAGNOSIS — R000 Tachycardia, unspecified: Secondary | ICD-10-CM | POA: Diagnosis not present

## 2019-07-08 DIAGNOSIS — N390 Urinary tract infection, site not specified: Secondary | ICD-10-CM | POA: Insufficient documentation

## 2019-07-08 DIAGNOSIS — R0682 Tachypnea, not elsewhere classified: Secondary | ICD-10-CM | POA: Diagnosis not present

## 2019-07-08 DIAGNOSIS — Z20822 Contact with and (suspected) exposure to covid-19: Secondary | ICD-10-CM | POA: Diagnosis not present

## 2019-07-08 DIAGNOSIS — F0390 Unspecified dementia without behavioral disturbance: Secondary | ICD-10-CM | POA: Diagnosis not present

## 2019-07-08 DIAGNOSIS — I959 Hypotension, unspecified: Secondary | ICD-10-CM | POA: Diagnosis not present

## 2019-07-08 DIAGNOSIS — E119 Type 2 diabetes mellitus without complications: Secondary | ICD-10-CM | POA: Insufficient documentation

## 2019-07-08 DIAGNOSIS — I10 Essential (primary) hypertension: Secondary | ICD-10-CM | POA: Diagnosis not present

## 2019-07-08 DIAGNOSIS — R0902 Hypoxemia: Secondary | ICD-10-CM | POA: Diagnosis not present

## 2019-07-08 DIAGNOSIS — Z79899 Other long term (current) drug therapy: Secondary | ICD-10-CM | POA: Insufficient documentation

## 2019-07-08 DIAGNOSIS — R0689 Other abnormalities of breathing: Secondary | ICD-10-CM | POA: Diagnosis not present

## 2019-07-08 LAB — URINALYSIS, ROUTINE W REFLEX MICROSCOPIC
Bilirubin Urine: NEGATIVE
Glucose, UA: NEGATIVE mg/dL
Hgb urine dipstick: NEGATIVE
Ketones, ur: 5 mg/dL — AB
Nitrite: NEGATIVE
Protein, ur: 30 mg/dL — AB
Specific Gravity, Urine: 1.02 (ref 1.005–1.030)
WBC, UA: >50 WBC/hpf — ABNORMAL HIGH (ref 0–5)
pH: 7 (ref 5.0–8.0)

## 2019-07-08 LAB — CBC WITH DIFFERENTIAL/PLATELET
Abs Immature Granulocytes: 0.05 10*3/uL (ref 0.00–0.07)
Basophils Absolute: 0 10*3/uL (ref 0.0–0.1)
Basophils Relative: 0 %
Eosinophils Absolute: 0 10*3/uL (ref 0.0–0.5)
Eosinophils Relative: 0 %
HCT: 40.5 % (ref 36.0–46.0)
Hemoglobin: 13 g/dL (ref 12.0–15.0)
Immature Granulocytes: 0 %
Lymphocytes Relative: 6 %
Lymphs Abs: 0.9 10*3/uL (ref 0.7–4.0)
MCH: 28 pg (ref 26.0–34.0)
MCHC: 32.1 g/dL (ref 30.0–36.0)
MCV: 87.3 fL (ref 80.0–100.0)
Monocytes Absolute: 1.2 10*3/uL — ABNORMAL HIGH (ref 0.1–1.0)
Monocytes Relative: 9 %
Neutro Abs: 11.4 10*3/uL — ABNORMAL HIGH (ref 1.7–7.7)
Neutrophils Relative %: 85 %
Platelets: 354 10*3/uL (ref 150–400)
RBC: 4.64 MIL/uL (ref 3.87–5.11)
RDW: 15.2 % (ref 11.5–15.5)
WBC: 13.6 10*3/uL — ABNORMAL HIGH (ref 4.0–10.5)
nRBC: 0 % (ref 0.0–0.2)

## 2019-07-08 LAB — COMPREHENSIVE METABOLIC PANEL
ALT: 16 U/L (ref 0–44)
AST: 27 U/L (ref 15–41)
Albumin: 3.4 g/dL — ABNORMAL LOW (ref 3.5–5.0)
Alkaline Phosphatase: 59 U/L (ref 38–126)
Anion gap: 15 (ref 5–15)
BUN: 21 mg/dL (ref 8–23)
CO2: 23 mmol/L (ref 22–32)
Calcium: 9.7 mg/dL (ref 8.9–10.3)
Chloride: 100 mmol/L (ref 98–111)
Creatinine, Ser: 1.25 mg/dL — ABNORMAL HIGH (ref 0.44–1.00)
GFR calc Af Amer: 48 mL/min — ABNORMAL LOW (ref >60)
GFR calc non Af Amer: 41 mL/min — ABNORMAL LOW (ref >60)
Glucose, Bld: 135 mg/dL — ABNORMAL HIGH (ref 70–99)
Potassium: 4.9 mmol/L (ref 3.5–5.1)
Sodium: 138 mmol/L (ref 135–145)
Total Bilirubin: 0.5 mg/dL (ref 0.3–1.2)
Total Protein: 7.3 g/dL (ref 6.5–8.1)

## 2019-07-08 LAB — TROPONIN I (HIGH SENSITIVITY): Troponin I (High Sensitivity): 9 ng/L (ref <18)

## 2019-07-08 LAB — CBG MONITORING, ED: Glucose-Capillary: 125 mg/dL — ABNORMAL HIGH (ref 70–99)

## 2019-07-08 MED ORDER — SODIUM CHLORIDE 0.9 % IV BOLUS
500.0000 mL | Freq: Once | INTRAVENOUS | Status: AC
  Administered 2019-07-08: 500 mL via INTRAVENOUS

## 2019-07-08 MED ORDER — ONDANSETRON 4 MG PO TBDP
8.0000 mg | ORAL_TABLET | Freq: Once | ORAL | Status: AC
  Administered 2019-07-08: 8 mg via ORAL
  Filled 2019-07-08: qty 2

## 2019-07-08 MED ORDER — SODIUM CHLORIDE 0.9 % IV BOLUS
500.0000 mL | Freq: Once | INTRAVENOUS | Status: AC
  Administered 2019-07-08: 18:00:00 500 mL via INTRAVENOUS

## 2019-07-08 MED ORDER — CEPHALEXIN 500 MG PO CAPS: 500.0000 mg | ORAL_CAPSULE | Freq: Three times a day (TID) | ORAL | 0 refills | Status: AC

## 2019-07-08 MED ORDER — SODIUM CHLORIDE 0.9 % IV SOLN
1.0000 g | Freq: Once | INTRAVENOUS | Status: AC
  Administered 2019-07-08: 1 g via INTRAVENOUS
  Filled 2019-07-08: qty 10

## 2019-07-08 MED ORDER — ONDANSETRON 8 MG PO TBDP: 8.0000 mg | ORAL_TABLET | Freq: Three times a day (TID) | ORAL | 0 refills | Status: AC | PRN

## 2019-07-08 NOTE — ED Notes (Signed)
Pt po challenged with juice and crackers. No n/v noted.

## 2019-07-08 NOTE — ED Triage Notes (Signed)
Per EMS: Pt here for C/O AMS x6 days and vomiting that began around 1530 today.  Pt has a history of strokes with deficits including right sided weakness and slurred speech.  Pt reportedly has an appointment with her neurologist on this coming Tuesday for a CT scan.   EMS vitals:  BP 150/80 HR 88 RR 34 NSR on Monitor

## 2019-07-08 NOTE — ED Notes (Signed)
Patient verbalizes understanding of discharge instructions. Opportunity for questioning and answers were provided. pt discharged from ED with caregiver.    

## 2019-07-08 NOTE — Discharge Instructions (Signed)
You are seen in the ER for nausea and vomiting.  Our work-up here shows that you have an underlying bladder infection, which we think likely attributed to your symptoms.  Please take the antibiotics prescribed. Take the nausea medication if needed  Please return to the ER if your symptoms worsen; you have increased pain, fevers, chills, inability to keep any medications down, confusion. Otherwise see the outpatient doctor as requested.

## 2019-07-08 NOTE — ED Provider Notes (Signed)
[place] EMERGENCY DEPARTMENT Provider Note   CSN: [id number] Arrival date & time: 07/08/19  1726     History Chief Complaint  Patient presents with  . Altered Mental Status  . Emesis    [name] is a 79 y.o. female.  HPI    79 year old female comes in a chief complaint of altered mental status and vomiting. Patient has history of diabetes, hypertension, hyperlipidemia, stroke and some impaired memory.  She lives by herself.  Caregiver is at the bedside.  She reports that patient had 2 visitors today and patient had emesis x2, and the visitors got concerned and sent patient to the ER.  The visitors are also told EMS that patient appeared slightly confused to them.  The caregiver however states that patient is at her baseline normal right now.  She also informs me that she was with the patient yesterday and she had no events or problems.  Patient has been eating and drinking well and has not complained of any chest pain, abdominal pain, UTI-like symptoms.  Patient has no complaints from her side.  She denies any chest pain, cough, shortness of breath, abdominal pain, back pain.  Level 5 caveat for dementia.    Past Medical History:  Diagnosis Date  . Diabetes mellitus   . Hypercholesteremia   . Hypertension   . Stroke [facility])     Patient Active Problem List   Diagnosis Date Noted  . Pain due to onychomycosis of toenails of both feet 11/28/2018  . Diabetes mellitus without complication ([place]) 40/98/1191  . Rhabdomyolysis 01/13/2015  . HLD (hyperlipidemia) 01/13/2015  . Diabetes type 2, controlled ([place]) 01/13/2015  . Faintness   . CVA (cerebral infarction) 10/25/2011  . UTI (lower urinary tract infection) 10/25/2011  . Dehydration 10/25/2011  . Syncope 10/23/2011  . H/O: CVA (cerebrovascular accident) 10/23/2011  . Hypertension 10/23/2011  . Diabetes mellitus ([place]) 10/23/2011    Past Surgical History:  Procedure Laterality Date  . TOOTH  EXTRACTION       OB History   No obstetric history on file.     Family History  Problem Relation Age of Onset  . Diabetes type II Other   . Hypertension Other   . Breast cancer Neg Hx     Social History   Tobacco Use  . Smoking status: Never Smoker  . Smokeless tobacco: Never Used  Substance Use Topics  . Alcohol use: No  . Drug use: No    Home Medications Prior to Admission medications   Medication Sig Start Date End Date Taking? Authorizing Provider  amLODipine (NORVASC) 2.5 MG tablet Take 2.5 mg by mouth daily with breakfast. 06/25/19  Yes [provider]  Calcium Carbonate-Vitamin D (CALCIUM-D PO) Take 1 tablet by mouth daily with breakfast.   Yes [provider]  dipyridamole-aspirin (AGGRENOX) 25-200 MG per 12 hr capsule Take 1 capsule by mouth 2 (two) times daily with a meal.    Yes [provider]  hydrALAZINE (APRESOLINE) 25 MG tablet Take 25 mg by mouth 2 (two) times daily with a meal.  03/20/18  Yes [provider]  latanoprost (XALATAN) 0.005 % ophthalmic solution Place 1 drop into both eyes at bedtime. Reported on 08/04/2015 12/07/14  Yes [provider]  levETIRAcetam (KEPPRA) 500 MG tablet Take 1 tablet (500 mg total) by mouth 2 (two) times daily. Patient taking differently: Take 250-500 mg by mouth See admin instructions. Take 1/2 tablet (250 mg) by mouth daily with  breakfast and 1 tablet (500 mg) with supper 01/16/15  Yes Vann, Jessica U, DO  lisinopril (PRINIVIL,ZESTRIL) 20 MG tablet Take 20 mg by mouth 2 (two) times daily with a meal.    Yes [provider]  oxybutynin (DITROPAN) 5 MG tablet Take 5 mg by mouth daily with breakfast.  03/27/18  Yes [provider]  rosuvastatin (CRESTOR) 5 MG tablet Take 5 mg by mouth daily with breakfast.  08/22/16  Yes [provider]    Allergies    Patient has no known allergies.  Review of Systems   Review of Systems  Unable to perform ROS: Dementia     Physical Exam Updated Vital Signs BP (!) 161/59 (BP Location: Right Arm)   Pulse 79   Temp 98.7 F (37.1 C) (Oral)   Resp (!) 26   Ht 5\' 1"  (1.549 m)   SpO2 100%   BMI 26.45 kg/m   Physical Exam Vitals and nursing note reviewed.  Constitutional:      Appearance: She is well-developed.  HENT:     Head: Normocephalic and atraumatic.  Cardiovascular:     Rate and Rhythm: Normal rate.  Pulmonary:     Effort: Pulmonary effort is normal.  Abdominal:     General: Bowel sounds are normal.  Musculoskeletal:     Cervical back: Normal range of motion and neck supple.  Skin:    General: Skin is warm and dry.  Neurological:     Mental Status: She is alert and oriented to person, place, and time.     ED Results / Procedures / Treatments   Labs (all labs ordered are listed, but only abnormal results are displayed) Labs Reviewed  COMPREHENSIVE METABOLIC PANEL - Abnormal; Notable for the following components:      Result Value   Glucose, Bld 135 (*)    Creatinine, Ser 1.25 (*)    Albumin 3.4 (*)    GFR calc non Af Amer 41 (*)    GFR calc Af Amer 48 (*)    All other components within normal limits  CBC WITH DIFFERENTIAL/PLATELET - Abnormal; Notable for the following components:   WBC 13.6 (*)    Neutro Abs 11.4 (*)    Monocytes Absolute 1.2 (*)    All other components within normal limits  URINALYSIS, ROUTINE W REFLEX MICROSCOPIC - Abnormal; Notable for the following components:   APPearance CLOUDY (*)    Ketones, ur 5 (*)    Protein, ur 30 (*)    Leukocytes,Ua MODERATE (*)    WBC, UA >50 (*)    Bacteria, UA FEW (*)    All other components within normal limits  CBG MONITORING, ED - Abnormal; Notable for the following components:   Glucose-Capillary 125 (*)    All other components within normal limits  NOVEL CORONAVIRUS, NAA (HOSP ORDER, SEND-OUT TO REF LAB; TAT 18-24 HRS)  TROPONIN I (HIGH SENSITIVITY)    EKG EKG Interpretation  Date/Time:  Sunday July 08 2019 20:34:53 EST Ventricular Rate:  74 PR Interval:    QRS Duration: 82 QT Interval:  401 QTC Calculation: 445 R Axis:   -8 Text Interpretation: Sinus rhythm Left ventricular hypertrophy Anterior infarct, old No acute changes No significant change since last tracing Confirmed by February 09 [phone]) on 07/08/2019 8:59:31 PM   Radiology No results found.  Procedures Procedures (including critical care time)  Medications Ordered in ED Medications  cefTRIAXone (ROCEPHIN) 1 g in sodium chloride 0.9 % 100 mL IVPB (has  no administration in time range)  sodium chloride 0.9 % bolus 500 mL (0 mLs Intravenous Stopped 07/08/19 1932)  ondansetron (ZOFRAN-ODT) disintegrating tablet 8 mg (8 mg Oral Given 07/08/19 1811)  sodium chloride 0.9 % bolus 500 mL (0 mLs Intravenous Stopped 07/08/19 2144)    ED Course  I have reviewed the triage vital signs and the nursing notes.  Pertinent labs & imaging results that were available during my care of the patient were reviewed by me and considered in my medical decision making (see chart for details).    MDM Rules/Calculators/A&P                      79 year old female comes in a chief complaint of vomiting and mental status change.  Caregiver is at the bedside and reports that patient is at her baseline self.  Patient is alert and able to answer all my questions appropriately, and she has no complaints from her side.  Eye exam overall is reassuring.  Patient has history of stroke and has right-sided deficit that is at baseline normal.  Her cerebellar exam is normal.  We ordered basic labs and her urine is positive for leukocytes, WBCs and bacteria.  Suspicion is high that she likely has cystitis that caused the nausea.  EKG and troponins are reassuring.  She will be discharged with a diagnosis of UTI.  The patient appears reasonably screened and/or stabilized for discharge and I doubt any other medical condition or other [facility] requiring further screening,  evaluation, or treatment in the ED at this time prior to discharge.   Results from the ER workup discussed with the patient face to face and all questions answered to the best of my ability. The patient is safe for discharge with strict return precautions.   Final Clinical Impression(s) / ED Diagnoses Final diagnoses:  Lower urinary tract infectious disease  Non-intractable vomiting with nausea, unspecified vomiting type    Rx / DC Orders ED Discharge Orders    None       Derwood Kaplan, MD 07/08/19 2155

## 2019-07-10 ENCOUNTER — Ambulatory Visit
Admission: RE | Admit: 2019-07-10 | Discharge: 2019-07-10 | Disposition: A | Discharge status: Home / Self Care | Payer: Medicare HMO | Source: Ambulatory Visit | Attending: Internal Medicine | Admitting: Internal Medicine

## 2019-07-10 ENCOUNTER — Telehealth: Payer: Self-pay

## 2019-07-10 DIAGNOSIS — S0990XA Unspecified injury of head, initial encounter: Secondary | ICD-10-CM | POA: Diagnosis not present

## 2019-07-10 DIAGNOSIS — R269 Unspecified abnormalities of gait and mobility: Secondary | ICD-10-CM

## 2019-07-10 LAB — NOVEL CORONAVIRUS, NAA (HOSP ORDER, SEND-OUT TO REF LAB; TAT 18-24 HRS): SARS-CoV-2, NAA: NOT DETECTED

## 2019-07-10 NOTE — Telephone Encounter (Signed)
Pt notified of negative COVID-19 results. Understanding verbalized.  Chasta M Hopkins   

## 2019-07-13 DIAGNOSIS — E11319 Type 2 diabetes mellitus with unspecified diabetic retinopathy without macular edema: Secondary | ICD-10-CM | POA: Diagnosis not present

## 2019-07-13 DIAGNOSIS — N390 Urinary tract infection, site not specified: Secondary | ICD-10-CM | POA: Diagnosis not present

## 2019-07-13 DIAGNOSIS — I69328 Other speech and language deficits following cerebral infarction: Secondary | ICD-10-CM | POA: Diagnosis not present

## 2019-07-13 DIAGNOSIS — I10 Essential (primary) hypertension: Secondary | ICD-10-CM | POA: Diagnosis not present

## 2019-07-13 DIAGNOSIS — E785 Hyperlipidemia, unspecified: Secondary | ICD-10-CM | POA: Diagnosis not present

## 2019-07-13 DIAGNOSIS — R569 Unspecified convulsions: Secondary | ICD-10-CM | POA: Diagnosis not present

## 2019-07-13 DIAGNOSIS — M1991 Primary osteoarthritis, unspecified site: Secondary | ICD-10-CM | POA: Diagnosis not present

## 2019-07-13 DIAGNOSIS — I69351 Hemiplegia and hemiparesis following cerebral infarction affecting right dominant side: Secondary | ICD-10-CM | POA: Diagnosis not present

## 2019-07-13 DIAGNOSIS — M5030 Other cervical disc degeneration, unspecified cervical region: Secondary | ICD-10-CM | POA: Diagnosis not present

## 2019-07-18 DIAGNOSIS — E7800 Pure hypercholesterolemia, unspecified: Secondary | ICD-10-CM | POA: Diagnosis not present

## 2019-07-18 DIAGNOSIS — E785 Hyperlipidemia, unspecified: Secondary | ICD-10-CM | POA: Diagnosis not present

## 2019-07-18 DIAGNOSIS — R569 Unspecified convulsions: Secondary | ICD-10-CM | POA: Diagnosis not present

## 2019-07-18 DIAGNOSIS — N182 Chronic kidney disease, stage 2 (mild): Secondary | ICD-10-CM | POA: Diagnosis not present

## 2019-07-18 DIAGNOSIS — I639 Cerebral infarction, unspecified: Secondary | ICD-10-CM | POA: Diagnosis not present

## 2019-07-18 DIAGNOSIS — N390 Urinary tract infection, site not specified: Secondary | ICD-10-CM | POA: Diagnosis not present

## 2019-07-18 DIAGNOSIS — M1991 Primary osteoarthritis, unspecified site: Secondary | ICD-10-CM | POA: Diagnosis not present

## 2019-07-18 DIAGNOSIS — E113299 Type 2 diabetes mellitus with mild nonproliferative diabetic retinopathy without macular edema, unspecified eye: Secondary | ICD-10-CM | POA: Diagnosis not present

## 2019-07-18 DIAGNOSIS — H409 Unspecified glaucoma: Secondary | ICD-10-CM | POA: Diagnosis not present

## 2019-07-18 DIAGNOSIS — I69351 Hemiplegia and hemiparesis following cerebral infarction affecting right dominant side: Secondary | ICD-10-CM | POA: Diagnosis not present

## 2019-07-18 DIAGNOSIS — I10 Essential (primary) hypertension: Secondary | ICD-10-CM | POA: Diagnosis not present

## 2019-07-18 DIAGNOSIS — E11319 Type 2 diabetes mellitus with unspecified diabetic retinopathy without macular edema: Secondary | ICD-10-CM | POA: Diagnosis not present

## 2019-07-18 DIAGNOSIS — M1990 Unspecified osteoarthritis, unspecified site: Secondary | ICD-10-CM | POA: Diagnosis not present

## 2019-07-18 DIAGNOSIS — I69328 Other speech and language deficits following cerebral infarction: Secondary | ICD-10-CM | POA: Diagnosis not present

## 2019-07-18 DIAGNOSIS — M5030 Other cervical disc degeneration, unspecified cervical region: Secondary | ICD-10-CM | POA: Diagnosis not present

## 2019-07-20 DIAGNOSIS — R569 Unspecified convulsions: Secondary | ICD-10-CM | POA: Diagnosis not present

## 2019-07-20 DIAGNOSIS — M1991 Primary osteoarthritis, unspecified site: Secondary | ICD-10-CM | POA: Diagnosis not present

## 2019-07-20 DIAGNOSIS — E785 Hyperlipidemia, unspecified: Secondary | ICD-10-CM | POA: Diagnosis not present

## 2019-07-20 DIAGNOSIS — I69351 Hemiplegia and hemiparesis following cerebral infarction affecting right dominant side: Secondary | ICD-10-CM | POA: Diagnosis not present

## 2019-07-20 DIAGNOSIS — M5030 Other cervical disc degeneration, unspecified cervical region: Secondary | ICD-10-CM | POA: Diagnosis not present

## 2019-07-20 DIAGNOSIS — N390 Urinary tract infection, site not specified: Secondary | ICD-10-CM | POA: Diagnosis not present

## 2019-07-20 DIAGNOSIS — E11319 Type 2 diabetes mellitus with unspecified diabetic retinopathy without macular edema: Secondary | ICD-10-CM | POA: Diagnosis not present

## 2019-07-20 DIAGNOSIS — I69328 Other speech and language deficits following cerebral infarction: Secondary | ICD-10-CM | POA: Diagnosis not present

## 2019-07-20 DIAGNOSIS — I10 Essential (primary) hypertension: Secondary | ICD-10-CM | POA: Diagnosis not present

## 2019-07-24 DIAGNOSIS — E785 Hyperlipidemia, unspecified: Secondary | ICD-10-CM | POA: Diagnosis not present

## 2019-07-24 DIAGNOSIS — M1991 Primary osteoarthritis, unspecified site: Secondary | ICD-10-CM | POA: Diagnosis not present

## 2019-07-24 DIAGNOSIS — R569 Unspecified convulsions: Secondary | ICD-10-CM | POA: Diagnosis not present

## 2019-07-24 DIAGNOSIS — I69351 Hemiplegia and hemiparesis following cerebral infarction affecting right dominant side: Secondary | ICD-10-CM | POA: Diagnosis not present

## 2019-07-24 DIAGNOSIS — N390 Urinary tract infection, site not specified: Secondary | ICD-10-CM | POA: Diagnosis not present

## 2019-07-24 DIAGNOSIS — I10 Essential (primary) hypertension: Secondary | ICD-10-CM | POA: Diagnosis not present

## 2019-07-24 DIAGNOSIS — I69328 Other speech and language deficits following cerebral infarction: Secondary | ICD-10-CM | POA: Diagnosis not present

## 2019-07-24 DIAGNOSIS — E11319 Type 2 diabetes mellitus with unspecified diabetic retinopathy without macular edema: Secondary | ICD-10-CM | POA: Diagnosis not present

## 2019-07-24 DIAGNOSIS — M5030 Other cervical disc degeneration, unspecified cervical region: Secondary | ICD-10-CM | POA: Diagnosis not present

## 2019-07-25 DIAGNOSIS — E11319 Type 2 diabetes mellitus with unspecified diabetic retinopathy without macular edema: Secondary | ICD-10-CM | POA: Diagnosis not present

## 2019-07-25 DIAGNOSIS — R569 Unspecified convulsions: Secondary | ICD-10-CM | POA: Diagnosis not present

## 2019-07-25 DIAGNOSIS — E785 Hyperlipidemia, unspecified: Secondary | ICD-10-CM | POA: Diagnosis not present

## 2019-07-25 DIAGNOSIS — N390 Urinary tract infection, site not specified: Secondary | ICD-10-CM | POA: Diagnosis not present

## 2019-07-25 DIAGNOSIS — M5030 Other cervical disc degeneration, unspecified cervical region: Secondary | ICD-10-CM | POA: Diagnosis not present

## 2019-07-25 DIAGNOSIS — M1991 Primary osteoarthritis, unspecified site: Secondary | ICD-10-CM | POA: Diagnosis not present

## 2019-07-25 DIAGNOSIS — I69328 Other speech and language deficits following cerebral infarction: Secondary | ICD-10-CM | POA: Diagnosis not present

## 2019-07-25 DIAGNOSIS — I10 Essential (primary) hypertension: Secondary | ICD-10-CM | POA: Diagnosis not present

## 2019-07-25 DIAGNOSIS — I69351 Hemiplegia and hemiparesis following cerebral infarction affecting right dominant side: Secondary | ICD-10-CM | POA: Diagnosis not present

## 2019-07-26 DIAGNOSIS — E785 Hyperlipidemia, unspecified: Secondary | ICD-10-CM | POA: Diagnosis not present

## 2019-07-26 DIAGNOSIS — M1991 Primary osteoarthritis, unspecified site: Secondary | ICD-10-CM | POA: Diagnosis not present

## 2019-07-26 DIAGNOSIS — M5030 Other cervical disc degeneration, unspecified cervical region: Secondary | ICD-10-CM | POA: Diagnosis not present

## 2019-07-26 DIAGNOSIS — I10 Essential (primary) hypertension: Secondary | ICD-10-CM | POA: Diagnosis not present

## 2019-07-26 DIAGNOSIS — R569 Unspecified convulsions: Secondary | ICD-10-CM | POA: Diagnosis not present

## 2019-07-26 DIAGNOSIS — N390 Urinary tract infection, site not specified: Secondary | ICD-10-CM | POA: Diagnosis not present

## 2019-07-26 DIAGNOSIS — I69328 Other speech and language deficits following cerebral infarction: Secondary | ICD-10-CM | POA: Diagnosis not present

## 2019-07-26 DIAGNOSIS — E11319 Type 2 diabetes mellitus with unspecified diabetic retinopathy without macular edema: Secondary | ICD-10-CM | POA: Diagnosis not present

## 2019-07-26 DIAGNOSIS — I69351 Hemiplegia and hemiparesis following cerebral infarction affecting right dominant side: Secondary | ICD-10-CM | POA: Diagnosis not present

## 2019-07-27 DIAGNOSIS — L89610 Pressure ulcer of right heel, unstageable: Secondary | ICD-10-CM | POA: Diagnosis not present

## 2019-07-27 DIAGNOSIS — L97409 Non-pressure chronic ulcer of unspecified heel and midfoot with unspecified severity: Secondary | ICD-10-CM | POA: Diagnosis not present

## 2019-07-29 DIAGNOSIS — E11319 Type 2 diabetes mellitus with unspecified diabetic retinopathy without macular edema: Secondary | ICD-10-CM | POA: Diagnosis not present

## 2019-07-29 DIAGNOSIS — M5030 Other cervical disc degeneration, unspecified cervical region: Secondary | ICD-10-CM | POA: Diagnosis not present

## 2019-07-29 DIAGNOSIS — R569 Unspecified convulsions: Secondary | ICD-10-CM | POA: Diagnosis not present

## 2019-07-29 DIAGNOSIS — M1991 Primary osteoarthritis, unspecified site: Secondary | ICD-10-CM | POA: Diagnosis not present

## 2019-07-29 DIAGNOSIS — I69328 Other speech and language deficits following cerebral infarction: Secondary | ICD-10-CM | POA: Diagnosis not present

## 2019-07-29 DIAGNOSIS — I10 Essential (primary) hypertension: Secondary | ICD-10-CM | POA: Diagnosis not present

## 2019-07-29 DIAGNOSIS — I69351 Hemiplegia and hemiparesis following cerebral infarction affecting right dominant side: Secondary | ICD-10-CM | POA: Diagnosis not present

## 2019-07-29 DIAGNOSIS — E785 Hyperlipidemia, unspecified: Secondary | ICD-10-CM | POA: Diagnosis not present

## 2019-07-29 DIAGNOSIS — N390 Urinary tract infection, site not specified: Secondary | ICD-10-CM | POA: Diagnosis not present

## 2019-07-31 DIAGNOSIS — N390 Urinary tract infection, site not specified: Secondary | ICD-10-CM | POA: Diagnosis not present

## 2019-07-31 DIAGNOSIS — I10 Essential (primary) hypertension: Secondary | ICD-10-CM | POA: Diagnosis not present

## 2019-07-31 DIAGNOSIS — R569 Unspecified convulsions: Secondary | ICD-10-CM | POA: Diagnosis not present

## 2019-07-31 DIAGNOSIS — I69351 Hemiplegia and hemiparesis following cerebral infarction affecting right dominant side: Secondary | ICD-10-CM | POA: Diagnosis not present

## 2019-07-31 DIAGNOSIS — E11319 Type 2 diabetes mellitus with unspecified diabetic retinopathy without macular edema: Secondary | ICD-10-CM | POA: Diagnosis not present

## 2019-07-31 DIAGNOSIS — E785 Hyperlipidemia, unspecified: Secondary | ICD-10-CM | POA: Diagnosis not present

## 2019-07-31 DIAGNOSIS — I69328 Other speech and language deficits following cerebral infarction: Secondary | ICD-10-CM | POA: Diagnosis not present

## 2019-07-31 DIAGNOSIS — M1991 Primary osteoarthritis, unspecified site: Secondary | ICD-10-CM | POA: Diagnosis not present

## 2019-07-31 DIAGNOSIS — M5030 Other cervical disc degeneration, unspecified cervical region: Secondary | ICD-10-CM | POA: Diagnosis not present

## 2019-08-01 DIAGNOSIS — I69351 Hemiplegia and hemiparesis following cerebral infarction affecting right dominant side: Secondary | ICD-10-CM | POA: Diagnosis not present

## 2019-08-01 DIAGNOSIS — I10 Essential (primary) hypertension: Secondary | ICD-10-CM | POA: Diagnosis not present

## 2019-08-01 DIAGNOSIS — M1991 Primary osteoarthritis, unspecified site: Secondary | ICD-10-CM | POA: Diagnosis not present

## 2019-08-01 DIAGNOSIS — R569 Unspecified convulsions: Secondary | ICD-10-CM | POA: Diagnosis not present

## 2019-08-01 DIAGNOSIS — E785 Hyperlipidemia, unspecified: Secondary | ICD-10-CM | POA: Diagnosis not present

## 2019-08-01 DIAGNOSIS — N390 Urinary tract infection, site not specified: Secondary | ICD-10-CM | POA: Diagnosis not present

## 2019-08-01 DIAGNOSIS — M5030 Other cervical disc degeneration, unspecified cervical region: Secondary | ICD-10-CM | POA: Diagnosis not present

## 2019-08-01 DIAGNOSIS — E11319 Type 2 diabetes mellitus with unspecified diabetic retinopathy without macular edema: Secondary | ICD-10-CM | POA: Diagnosis not present

## 2019-08-01 DIAGNOSIS — I69328 Other speech and language deficits following cerebral infarction: Secondary | ICD-10-CM | POA: Diagnosis not present

## 2019-08-02 DIAGNOSIS — R569 Unspecified convulsions: Secondary | ICD-10-CM | POA: Diagnosis not present

## 2019-08-02 DIAGNOSIS — N390 Urinary tract infection, site not specified: Secondary | ICD-10-CM | POA: Diagnosis not present

## 2019-08-02 DIAGNOSIS — E11319 Type 2 diabetes mellitus with unspecified diabetic retinopathy without macular edema: Secondary | ICD-10-CM | POA: Diagnosis not present

## 2019-08-02 DIAGNOSIS — E785 Hyperlipidemia, unspecified: Secondary | ICD-10-CM | POA: Diagnosis not present

## 2019-08-02 DIAGNOSIS — I10 Essential (primary) hypertension: Secondary | ICD-10-CM | POA: Diagnosis not present

## 2019-08-02 DIAGNOSIS — I69328 Other speech and language deficits following cerebral infarction: Secondary | ICD-10-CM | POA: Diagnosis not present

## 2019-08-02 DIAGNOSIS — I69351 Hemiplegia and hemiparesis following cerebral infarction affecting right dominant side: Secondary | ICD-10-CM | POA: Diagnosis not present

## 2019-08-02 DIAGNOSIS — M5030 Other cervical disc degeneration, unspecified cervical region: Secondary | ICD-10-CM | POA: Diagnosis not present

## 2019-08-02 DIAGNOSIS — M1991 Primary osteoarthritis, unspecified site: Secondary | ICD-10-CM | POA: Diagnosis not present

## 2019-08-07 DIAGNOSIS — I69328 Other speech and language deficits following cerebral infarction: Secondary | ICD-10-CM | POA: Diagnosis not present

## 2019-08-07 DIAGNOSIS — M1991 Primary osteoarthritis, unspecified site: Secondary | ICD-10-CM | POA: Diagnosis not present

## 2019-08-07 DIAGNOSIS — I69351 Hemiplegia and hemiparesis following cerebral infarction affecting right dominant side: Secondary | ICD-10-CM | POA: Diagnosis not present

## 2019-08-07 DIAGNOSIS — I10 Essential (primary) hypertension: Secondary | ICD-10-CM | POA: Diagnosis not present

## 2019-08-07 DIAGNOSIS — E11319 Type 2 diabetes mellitus with unspecified diabetic retinopathy without macular edema: Secondary | ICD-10-CM | POA: Diagnosis not present

## 2019-08-07 DIAGNOSIS — N390 Urinary tract infection, site not specified: Secondary | ICD-10-CM | POA: Diagnosis not present

## 2019-08-07 DIAGNOSIS — R569 Unspecified convulsions: Secondary | ICD-10-CM | POA: Diagnosis not present

## 2019-08-07 DIAGNOSIS — M5030 Other cervical disc degeneration, unspecified cervical region: Secondary | ICD-10-CM | POA: Diagnosis not present

## 2019-08-07 DIAGNOSIS — E785 Hyperlipidemia, unspecified: Secondary | ICD-10-CM | POA: Diagnosis not present

## 2019-08-08 DIAGNOSIS — R569 Unspecified convulsions: Secondary | ICD-10-CM | POA: Diagnosis not present

## 2019-08-08 DIAGNOSIS — I10 Essential (primary) hypertension: Secondary | ICD-10-CM | POA: Diagnosis not present

## 2019-08-08 DIAGNOSIS — M1991 Primary osteoarthritis, unspecified site: Secondary | ICD-10-CM | POA: Diagnosis not present

## 2019-08-08 DIAGNOSIS — M5030 Other cervical disc degeneration, unspecified cervical region: Secondary | ICD-10-CM | POA: Diagnosis not present

## 2019-08-08 DIAGNOSIS — I69351 Hemiplegia and hemiparesis following cerebral infarction affecting right dominant side: Secondary | ICD-10-CM | POA: Diagnosis not present

## 2019-08-08 DIAGNOSIS — E11319 Type 2 diabetes mellitus with unspecified diabetic retinopathy without macular edema: Secondary | ICD-10-CM | POA: Diagnosis not present

## 2019-08-08 DIAGNOSIS — N390 Urinary tract infection, site not specified: Secondary | ICD-10-CM | POA: Diagnosis not present

## 2019-08-08 DIAGNOSIS — E785 Hyperlipidemia, unspecified: Secondary | ICD-10-CM | POA: Diagnosis not present

## 2019-08-08 DIAGNOSIS — I69328 Other speech and language deficits following cerebral infarction: Secondary | ICD-10-CM | POA: Diagnosis not present

## 2019-08-09 DIAGNOSIS — I69351 Hemiplegia and hemiparesis following cerebral infarction affecting right dominant side: Secondary | ICD-10-CM | POA: Diagnosis not present

## 2019-08-09 DIAGNOSIS — R6889 Other general symptoms and signs: Secondary | ICD-10-CM | POA: Diagnosis not present

## 2019-08-09 DIAGNOSIS — R569 Unspecified convulsions: Secondary | ICD-10-CM | POA: Diagnosis not present

## 2019-08-09 DIAGNOSIS — I10 Essential (primary) hypertension: Secondary | ICD-10-CM | POA: Diagnosis not present

## 2019-08-09 DIAGNOSIS — I69328 Other speech and language deficits following cerebral infarction: Secondary | ICD-10-CM | POA: Diagnosis not present

## 2019-08-09 DIAGNOSIS — E785 Hyperlipidemia, unspecified: Secondary | ICD-10-CM | POA: Diagnosis not present

## 2019-08-09 DIAGNOSIS — E11319 Type 2 diabetes mellitus with unspecified diabetic retinopathy without macular edema: Secondary | ICD-10-CM | POA: Diagnosis not present

## 2019-08-09 DIAGNOSIS — M5030 Other cervical disc degeneration, unspecified cervical region: Secondary | ICD-10-CM | POA: Diagnosis not present

## 2019-08-09 DIAGNOSIS — N390 Urinary tract infection, site not specified: Secondary | ICD-10-CM | POA: Diagnosis not present

## 2019-08-09 DIAGNOSIS — M1991 Primary osteoarthritis, unspecified site: Secondary | ICD-10-CM | POA: Diagnosis not present

## 2019-08-10 DIAGNOSIS — M5030 Other cervical disc degeneration, unspecified cervical region: Secondary | ICD-10-CM | POA: Diagnosis not present

## 2019-08-10 DIAGNOSIS — E11319 Type 2 diabetes mellitus with unspecified diabetic retinopathy without macular edema: Secondary | ICD-10-CM | POA: Diagnosis not present

## 2019-08-10 DIAGNOSIS — R569 Unspecified convulsions: Secondary | ICD-10-CM | POA: Diagnosis not present

## 2019-08-10 DIAGNOSIS — I69328 Other speech and language deficits following cerebral infarction: Secondary | ICD-10-CM | POA: Diagnosis not present

## 2019-08-10 DIAGNOSIS — I69351 Hemiplegia and hemiparesis following cerebral infarction affecting right dominant side: Secondary | ICD-10-CM | POA: Diagnosis not present

## 2019-08-10 DIAGNOSIS — E785 Hyperlipidemia, unspecified: Secondary | ICD-10-CM | POA: Diagnosis not present

## 2019-08-10 DIAGNOSIS — I10 Essential (primary) hypertension: Secondary | ICD-10-CM | POA: Diagnosis not present

## 2019-08-10 DIAGNOSIS — M1991 Primary osteoarthritis, unspecified site: Secondary | ICD-10-CM | POA: Diagnosis not present

## 2019-08-10 DIAGNOSIS — N390 Urinary tract infection, site not specified: Secondary | ICD-10-CM | POA: Diagnosis not present

## 2019-08-12 DIAGNOSIS — E785 Hyperlipidemia, unspecified: Secondary | ICD-10-CM | POA: Diagnosis not present

## 2019-08-12 DIAGNOSIS — E11319 Type 2 diabetes mellitus with unspecified diabetic retinopathy without macular edema: Secondary | ICD-10-CM | POA: Diagnosis not present

## 2019-08-12 DIAGNOSIS — R569 Unspecified convulsions: Secondary | ICD-10-CM | POA: Diagnosis not present

## 2019-08-12 DIAGNOSIS — I10 Essential (primary) hypertension: Secondary | ICD-10-CM | POA: Diagnosis not present

## 2019-08-12 DIAGNOSIS — I69351 Hemiplegia and hemiparesis following cerebral infarction affecting right dominant side: Secondary | ICD-10-CM | POA: Diagnosis not present

## 2019-08-12 DIAGNOSIS — N390 Urinary tract infection, site not specified: Secondary | ICD-10-CM | POA: Diagnosis not present

## 2019-08-12 DIAGNOSIS — M1991 Primary osteoarthritis, unspecified site: Secondary | ICD-10-CM | POA: Diagnosis not present

## 2019-08-12 DIAGNOSIS — M5030 Other cervical disc degeneration, unspecified cervical region: Secondary | ICD-10-CM | POA: Diagnosis not present

## 2019-08-12 DIAGNOSIS — I69328 Other speech and language deficits following cerebral infarction: Secondary | ICD-10-CM | POA: Diagnosis not present

## 2019-08-14 DIAGNOSIS — R569 Unspecified convulsions: Secondary | ICD-10-CM | POA: Diagnosis not present

## 2019-08-14 DIAGNOSIS — M5030 Other cervical disc degeneration, unspecified cervical region: Secondary | ICD-10-CM | POA: Diagnosis not present

## 2019-08-14 DIAGNOSIS — I69351 Hemiplegia and hemiparesis following cerebral infarction affecting right dominant side: Secondary | ICD-10-CM | POA: Diagnosis not present

## 2019-08-14 DIAGNOSIS — I10 Essential (primary) hypertension: Secondary | ICD-10-CM | POA: Diagnosis not present

## 2019-08-14 DIAGNOSIS — M1991 Primary osteoarthritis, unspecified site: Secondary | ICD-10-CM | POA: Diagnosis not present

## 2019-08-14 DIAGNOSIS — N390 Urinary tract infection, site not specified: Secondary | ICD-10-CM | POA: Diagnosis not present

## 2019-08-14 DIAGNOSIS — E785 Hyperlipidemia, unspecified: Secondary | ICD-10-CM | POA: Diagnosis not present

## 2019-08-14 DIAGNOSIS — E11319 Type 2 diabetes mellitus with unspecified diabetic retinopathy without macular edema: Secondary | ICD-10-CM | POA: Diagnosis not present

## 2019-08-14 DIAGNOSIS — I69328 Other speech and language deficits following cerebral infarction: Secondary | ICD-10-CM | POA: Diagnosis not present

## 2019-08-15 DIAGNOSIS — I69328 Other speech and language deficits following cerebral infarction: Secondary | ICD-10-CM | POA: Diagnosis not present

## 2019-08-15 DIAGNOSIS — M5030 Other cervical disc degeneration, unspecified cervical region: Secondary | ICD-10-CM | POA: Diagnosis not present

## 2019-08-15 DIAGNOSIS — R569 Unspecified convulsions: Secondary | ICD-10-CM | POA: Diagnosis not present

## 2019-08-15 DIAGNOSIS — I69351 Hemiplegia and hemiparesis following cerebral infarction affecting right dominant side: Secondary | ICD-10-CM | POA: Diagnosis not present

## 2019-08-15 DIAGNOSIS — M1991 Primary osteoarthritis, unspecified site: Secondary | ICD-10-CM | POA: Diagnosis not present

## 2019-08-15 DIAGNOSIS — E11319 Type 2 diabetes mellitus with unspecified diabetic retinopathy without macular edema: Secondary | ICD-10-CM | POA: Diagnosis not present

## 2019-08-15 DIAGNOSIS — N390 Urinary tract infection, site not specified: Secondary | ICD-10-CM | POA: Diagnosis not present

## 2019-08-15 DIAGNOSIS — I10 Essential (primary) hypertension: Secondary | ICD-10-CM | POA: Diagnosis not present

## 2019-08-15 DIAGNOSIS — E785 Hyperlipidemia, unspecified: Secondary | ICD-10-CM | POA: Diagnosis not present

## 2019-08-21 DIAGNOSIS — M5030 Other cervical disc degeneration, unspecified cervical region: Secondary | ICD-10-CM | POA: Diagnosis not present

## 2019-08-21 DIAGNOSIS — N182 Chronic kidney disease, stage 2 (mild): Secondary | ICD-10-CM | POA: Diagnosis not present

## 2019-08-21 DIAGNOSIS — E785 Hyperlipidemia, unspecified: Secondary | ICD-10-CM | POA: Diagnosis not present

## 2019-08-21 DIAGNOSIS — I69351 Hemiplegia and hemiparesis following cerebral infarction affecting right dominant side: Secondary | ICD-10-CM | POA: Diagnosis not present

## 2019-08-21 DIAGNOSIS — E11319 Type 2 diabetes mellitus with unspecified diabetic retinopathy without macular edema: Secondary | ICD-10-CM | POA: Diagnosis not present

## 2019-08-21 DIAGNOSIS — I10 Essential (primary) hypertension: Secondary | ICD-10-CM | POA: Diagnosis not present

## 2019-08-21 DIAGNOSIS — I69328 Other speech and language deficits following cerebral infarction: Secondary | ICD-10-CM | POA: Diagnosis not present

## 2019-08-21 DIAGNOSIS — R569 Unspecified convulsions: Secondary | ICD-10-CM | POA: Diagnosis not present

## 2019-08-21 DIAGNOSIS — E113299 Type 2 diabetes mellitus with mild nonproliferative diabetic retinopathy without macular edema, unspecified eye: Secondary | ICD-10-CM | POA: Diagnosis not present

## 2019-08-21 DIAGNOSIS — N390 Urinary tract infection, site not specified: Secondary | ICD-10-CM | POA: Diagnosis not present

## 2019-08-21 DIAGNOSIS — G4700 Insomnia, unspecified: Secondary | ICD-10-CM | POA: Diagnosis not present

## 2019-08-21 DIAGNOSIS — I639 Cerebral infarction, unspecified: Secondary | ICD-10-CM | POA: Diagnosis not present

## 2019-08-21 DIAGNOSIS — M1991 Primary osteoarthritis, unspecified site: Secondary | ICD-10-CM | POA: Diagnosis not present

## 2019-08-21 DIAGNOSIS — E7800 Pure hypercholesterolemia, unspecified: Secondary | ICD-10-CM | POA: Diagnosis not present

## 2019-08-21 DIAGNOSIS — M1990 Unspecified osteoarthritis, unspecified site: Secondary | ICD-10-CM | POA: Diagnosis not present

## 2019-08-21 DIAGNOSIS — H409 Unspecified glaucoma: Secondary | ICD-10-CM | POA: Diagnosis not present

## 2019-08-22 DIAGNOSIS — I10 Essential (primary) hypertension: Secondary | ICD-10-CM | POA: Diagnosis not present

## 2019-08-22 DIAGNOSIS — M5030 Other cervical disc degeneration, unspecified cervical region: Secondary | ICD-10-CM | POA: Diagnosis not present

## 2019-08-22 DIAGNOSIS — I69351 Hemiplegia and hemiparesis following cerebral infarction affecting right dominant side: Secondary | ICD-10-CM | POA: Diagnosis not present

## 2019-08-22 DIAGNOSIS — E785 Hyperlipidemia, unspecified: Secondary | ICD-10-CM | POA: Diagnosis not present

## 2019-08-22 DIAGNOSIS — E11319 Type 2 diabetes mellitus with unspecified diabetic retinopathy without macular edema: Secondary | ICD-10-CM | POA: Diagnosis not present

## 2019-08-22 DIAGNOSIS — R569 Unspecified convulsions: Secondary | ICD-10-CM | POA: Diagnosis not present

## 2019-08-22 DIAGNOSIS — I69328 Other speech and language deficits following cerebral infarction: Secondary | ICD-10-CM | POA: Diagnosis not present

## 2019-08-22 DIAGNOSIS — M1991 Primary osteoarthritis, unspecified site: Secondary | ICD-10-CM | POA: Diagnosis not present

## 2019-08-22 DIAGNOSIS — N390 Urinary tract infection, site not specified: Secondary | ICD-10-CM | POA: Diagnosis not present

## 2019-08-24 DIAGNOSIS — E11319 Type 2 diabetes mellitus with unspecified diabetic retinopathy without macular edema: Secondary | ICD-10-CM | POA: Diagnosis not present

## 2019-08-24 DIAGNOSIS — I10 Essential (primary) hypertension: Secondary | ICD-10-CM | POA: Diagnosis not present

## 2019-08-24 DIAGNOSIS — I69328 Other speech and language deficits following cerebral infarction: Secondary | ICD-10-CM | POA: Diagnosis not present

## 2019-08-24 DIAGNOSIS — N390 Urinary tract infection, site not specified: Secondary | ICD-10-CM | POA: Diagnosis not present

## 2019-08-24 DIAGNOSIS — M1991 Primary osteoarthritis, unspecified site: Secondary | ICD-10-CM | POA: Diagnosis not present

## 2019-08-24 DIAGNOSIS — E785 Hyperlipidemia, unspecified: Secondary | ICD-10-CM | POA: Diagnosis not present

## 2019-08-24 DIAGNOSIS — R569 Unspecified convulsions: Secondary | ICD-10-CM | POA: Diagnosis not present

## 2019-08-24 DIAGNOSIS — I69351 Hemiplegia and hemiparesis following cerebral infarction affecting right dominant side: Secondary | ICD-10-CM | POA: Diagnosis not present

## 2019-08-24 DIAGNOSIS — M5030 Other cervical disc degeneration, unspecified cervical region: Secondary | ICD-10-CM | POA: Diagnosis not present

## 2019-08-28 DIAGNOSIS — E11319 Type 2 diabetes mellitus with unspecified diabetic retinopathy without macular edema: Secondary | ICD-10-CM | POA: Diagnosis not present

## 2019-08-28 DIAGNOSIS — I10 Essential (primary) hypertension: Secondary | ICD-10-CM | POA: Diagnosis not present

## 2019-08-28 DIAGNOSIS — M1991 Primary osteoarthritis, unspecified site: Secondary | ICD-10-CM | POA: Diagnosis not present

## 2019-08-28 DIAGNOSIS — M5030 Other cervical disc degeneration, unspecified cervical region: Secondary | ICD-10-CM | POA: Diagnosis not present

## 2019-08-28 DIAGNOSIS — N390 Urinary tract infection, site not specified: Secondary | ICD-10-CM | POA: Diagnosis not present

## 2019-08-28 DIAGNOSIS — I69351 Hemiplegia and hemiparesis following cerebral infarction affecting right dominant side: Secondary | ICD-10-CM | POA: Diagnosis not present

## 2019-08-28 DIAGNOSIS — I69328 Other speech and language deficits following cerebral infarction: Secondary | ICD-10-CM | POA: Diagnosis not present

## 2019-08-28 DIAGNOSIS — E785 Hyperlipidemia, unspecified: Secondary | ICD-10-CM | POA: Diagnosis not present

## 2019-08-28 DIAGNOSIS — R569 Unspecified convulsions: Secondary | ICD-10-CM | POA: Diagnosis not present

## 2019-08-29 DIAGNOSIS — N390 Urinary tract infection, site not specified: Secondary | ICD-10-CM | POA: Diagnosis not present

## 2019-08-29 DIAGNOSIS — R569 Unspecified convulsions: Secondary | ICD-10-CM | POA: Diagnosis not present

## 2019-08-29 DIAGNOSIS — I10 Essential (primary) hypertension: Secondary | ICD-10-CM | POA: Diagnosis not present

## 2019-08-29 DIAGNOSIS — E11319 Type 2 diabetes mellitus with unspecified diabetic retinopathy without macular edema: Secondary | ICD-10-CM | POA: Diagnosis not present

## 2019-08-29 DIAGNOSIS — M5030 Other cervical disc degeneration, unspecified cervical region: Secondary | ICD-10-CM | POA: Diagnosis not present

## 2019-08-29 DIAGNOSIS — I69328 Other speech and language deficits following cerebral infarction: Secondary | ICD-10-CM | POA: Diagnosis not present

## 2019-08-29 DIAGNOSIS — M1991 Primary osteoarthritis, unspecified site: Secondary | ICD-10-CM | POA: Diagnosis not present

## 2019-08-29 DIAGNOSIS — E785 Hyperlipidemia, unspecified: Secondary | ICD-10-CM | POA: Diagnosis not present

## 2019-08-29 DIAGNOSIS — I69351 Hemiplegia and hemiparesis following cerebral infarction affecting right dominant side: Secondary | ICD-10-CM | POA: Diagnosis not present

## 2019-08-31 DIAGNOSIS — I10 Essential (primary) hypertension: Secondary | ICD-10-CM | POA: Diagnosis not present

## 2019-08-31 DIAGNOSIS — N390 Urinary tract infection, site not specified: Secondary | ICD-10-CM | POA: Diagnosis not present

## 2019-08-31 DIAGNOSIS — I69351 Hemiplegia and hemiparesis following cerebral infarction affecting right dominant side: Secondary | ICD-10-CM | POA: Diagnosis not present

## 2019-08-31 DIAGNOSIS — R569 Unspecified convulsions: Secondary | ICD-10-CM | POA: Diagnosis not present

## 2019-08-31 DIAGNOSIS — I69328 Other speech and language deficits following cerebral infarction: Secondary | ICD-10-CM | POA: Diagnosis not present

## 2019-08-31 DIAGNOSIS — E785 Hyperlipidemia, unspecified: Secondary | ICD-10-CM | POA: Diagnosis not present

## 2019-08-31 DIAGNOSIS — E11319 Type 2 diabetes mellitus with unspecified diabetic retinopathy without macular edema: Secondary | ICD-10-CM | POA: Diagnosis not present

## 2019-08-31 DIAGNOSIS — M5030 Other cervical disc degeneration, unspecified cervical region: Secondary | ICD-10-CM | POA: Diagnosis not present

## 2019-08-31 DIAGNOSIS — M1991 Primary osteoarthritis, unspecified site: Secondary | ICD-10-CM | POA: Diagnosis not present

## 2019-09-03 DIAGNOSIS — I69351 Hemiplegia and hemiparesis following cerebral infarction affecting right dominant side: Secondary | ICD-10-CM | POA: Diagnosis not present

## 2019-09-03 DIAGNOSIS — N390 Urinary tract infection, site not specified: Secondary | ICD-10-CM | POA: Diagnosis not present

## 2019-09-03 DIAGNOSIS — I69328 Other speech and language deficits following cerebral infarction: Secondary | ICD-10-CM | POA: Diagnosis not present

## 2019-09-03 DIAGNOSIS — I10 Essential (primary) hypertension: Secondary | ICD-10-CM | POA: Diagnosis not present

## 2019-09-03 DIAGNOSIS — E785 Hyperlipidemia, unspecified: Secondary | ICD-10-CM | POA: Diagnosis not present

## 2019-09-03 DIAGNOSIS — E11319 Type 2 diabetes mellitus with unspecified diabetic retinopathy without macular edema: Secondary | ICD-10-CM | POA: Diagnosis not present

## 2019-09-03 DIAGNOSIS — R569 Unspecified convulsions: Secondary | ICD-10-CM | POA: Diagnosis not present

## 2019-09-03 DIAGNOSIS — M1991 Primary osteoarthritis, unspecified site: Secondary | ICD-10-CM | POA: Diagnosis not present

## 2019-09-03 DIAGNOSIS — M5030 Other cervical disc degeneration, unspecified cervical region: Secondary | ICD-10-CM | POA: Diagnosis not present

## 2019-09-04 DIAGNOSIS — E11319 Type 2 diabetes mellitus with unspecified diabetic retinopathy without macular edema: Secondary | ICD-10-CM | POA: Diagnosis not present

## 2019-09-04 DIAGNOSIS — I10 Essential (primary) hypertension: Secondary | ICD-10-CM | POA: Diagnosis not present

## 2019-09-04 DIAGNOSIS — I69351 Hemiplegia and hemiparesis following cerebral infarction affecting right dominant side: Secondary | ICD-10-CM | POA: Diagnosis not present

## 2019-09-04 DIAGNOSIS — I69328 Other speech and language deficits following cerebral infarction: Secondary | ICD-10-CM | POA: Diagnosis not present

## 2019-09-04 DIAGNOSIS — M1991 Primary osteoarthritis, unspecified site: Secondary | ICD-10-CM | POA: Diagnosis not present

## 2019-09-04 DIAGNOSIS — E785 Hyperlipidemia, unspecified: Secondary | ICD-10-CM | POA: Diagnosis not present

## 2019-09-04 DIAGNOSIS — N390 Urinary tract infection, site not specified: Secondary | ICD-10-CM | POA: Diagnosis not present

## 2019-09-04 DIAGNOSIS — M5030 Other cervical disc degeneration, unspecified cervical region: Secondary | ICD-10-CM | POA: Diagnosis not present

## 2019-09-04 DIAGNOSIS — R569 Unspecified convulsions: Secondary | ICD-10-CM | POA: Diagnosis not present

## 2019-09-06 DIAGNOSIS — E785 Hyperlipidemia, unspecified: Secondary | ICD-10-CM | POA: Diagnosis not present

## 2019-09-06 DIAGNOSIS — R569 Unspecified convulsions: Secondary | ICD-10-CM | POA: Diagnosis not present

## 2019-09-06 DIAGNOSIS — M1991 Primary osteoarthritis, unspecified site: Secondary | ICD-10-CM | POA: Diagnosis not present

## 2019-09-06 DIAGNOSIS — I69351 Hemiplegia and hemiparesis following cerebral infarction affecting right dominant side: Secondary | ICD-10-CM | POA: Diagnosis not present

## 2019-09-06 DIAGNOSIS — M5030 Other cervical disc degeneration, unspecified cervical region: Secondary | ICD-10-CM | POA: Diagnosis not present

## 2019-09-06 DIAGNOSIS — N390 Urinary tract infection, site not specified: Secondary | ICD-10-CM | POA: Diagnosis not present

## 2019-09-06 DIAGNOSIS — I10 Essential (primary) hypertension: Secondary | ICD-10-CM | POA: Diagnosis not present

## 2019-09-06 DIAGNOSIS — I69328 Other speech and language deficits following cerebral infarction: Secondary | ICD-10-CM | POA: Diagnosis not present

## 2019-09-06 DIAGNOSIS — E11319 Type 2 diabetes mellitus with unspecified diabetic retinopathy without macular edema: Secondary | ICD-10-CM | POA: Diagnosis not present

## 2019-09-10 DIAGNOSIS — R6889 Other general symptoms and signs: Secondary | ICD-10-CM | POA: Diagnosis not present

## 2019-09-11 ENCOUNTER — Emergency Department (HOSPITAL_COMMUNITY)
Admission: EM | Admit: 2019-09-11 | Discharge: 2019-09-11 | Disposition: A | Discharge status: Home / Self Care | Payer: Medicare HMO | Attending: Emergency Medicine | Admitting: Emergency Medicine

## 2019-09-11 ENCOUNTER — Encounter (HOSPITAL_COMMUNITY): Payer: Self-pay | Admitting: Emergency Medicine

## 2019-09-11 DIAGNOSIS — E11319 Type 2 diabetes mellitus with unspecified diabetic retinopathy without macular edema: Secondary | ICD-10-CM | POA: Diagnosis not present

## 2019-09-11 DIAGNOSIS — Y939 Activity, unspecified: Secondary | ICD-10-CM | POA: Insufficient documentation

## 2019-09-11 DIAGNOSIS — Z9181 History of falling: Secondary | ICD-10-CM | POA: Diagnosis not present

## 2019-09-11 DIAGNOSIS — X58XXXA Exposure to other specified factors, initial encounter: Secondary | ICD-10-CM | POA: Diagnosis not present

## 2019-09-11 DIAGNOSIS — R569 Unspecified convulsions: Secondary | ICD-10-CM | POA: Diagnosis not present

## 2019-09-11 DIAGNOSIS — I69328 Other speech and language deficits following cerebral infarction: Secondary | ICD-10-CM | POA: Diagnosis not present

## 2019-09-11 DIAGNOSIS — Y929 Unspecified place or not applicable: Secondary | ICD-10-CM | POA: Insufficient documentation

## 2019-09-11 DIAGNOSIS — E119 Type 2 diabetes mellitus without complications: Secondary | ICD-10-CM | POA: Insufficient documentation

## 2019-09-11 DIAGNOSIS — S91309A Unspecified open wound, unspecified foot, initial encounter: Secondary | ICD-10-CM

## 2019-09-11 DIAGNOSIS — E785 Hyperlipidemia, unspecified: Secondary | ICD-10-CM | POA: Diagnosis not present

## 2019-09-11 DIAGNOSIS — G8929 Other chronic pain: Secondary | ICD-10-CM | POA: Diagnosis not present

## 2019-09-11 DIAGNOSIS — S91302A Unspecified open wound, left foot, initial encounter: Secondary | ICD-10-CM | POA: Insufficient documentation

## 2019-09-11 DIAGNOSIS — Y999 Unspecified external cause status: Secondary | ICD-10-CM | POA: Insufficient documentation

## 2019-09-11 DIAGNOSIS — R2242 Localized swelling, mass and lump, left lower limb: Secondary | ICD-10-CM | POA: Diagnosis not present

## 2019-09-11 DIAGNOSIS — Z8673 Personal history of transient ischemic attack (TIA), and cerebral infarction without residual deficits: Secondary | ICD-10-CM | POA: Diagnosis not present

## 2019-09-11 DIAGNOSIS — Z79899 Other long term (current) drug therapy: Secondary | ICD-10-CM | POA: Insufficient documentation

## 2019-09-11 DIAGNOSIS — R6883 Chills (without fever): Secondary | ICD-10-CM | POA: Insufficient documentation

## 2019-09-11 DIAGNOSIS — I10 Essential (primary) hypertension: Secondary | ICD-10-CM | POA: Insufficient documentation

## 2019-09-11 DIAGNOSIS — M5030 Other cervical disc degeneration, unspecified cervical region: Secondary | ICD-10-CM | POA: Diagnosis not present

## 2019-09-11 DIAGNOSIS — R0902 Hypoxemia: Secondary | ICD-10-CM | POA: Diagnosis not present

## 2019-09-11 DIAGNOSIS — L89622 Pressure ulcer of left heel, stage 2: Secondary | ICD-10-CM | POA: Diagnosis not present

## 2019-09-11 DIAGNOSIS — I69351 Hemiplegia and hemiparesis following cerebral infarction affecting right dominant side: Secondary | ICD-10-CM | POA: Diagnosis not present

## 2019-09-11 LAB — CBC WITH DIFFERENTIAL/PLATELET
Abs Immature Granulocytes: 0.02 10*3/uL (ref 0.00–0.07)
Basophils Absolute: 0 10*3/uL (ref 0.0–0.1)
Basophils Relative: 0 %
Eosinophils Absolute: 0 10*3/uL (ref 0.0–0.5)
Eosinophils Relative: 0 %
HCT: 41.2 % (ref 36.0–46.0)
Hemoglobin: 12.9 g/dL (ref 12.0–15.0)
Immature Granulocytes: 0 %
Lymphocytes Relative: 8 %
Lymphs Abs: 0.7 10*3/uL (ref 0.7–4.0)
MCH: 28.5 pg (ref 26.0–34.0)
MCHC: 31.3 g/dL (ref 30.0–36.0)
MCV: 91.2 fL (ref 80.0–100.0)
Monocytes Absolute: 0.6 10*3/uL (ref 0.1–1.0)
Monocytes Relative: 7 %
Neutro Abs: 7.1 10*3/uL (ref 1.7–7.7)
Neutrophils Relative %: 85 %
Platelets: 297 10*3/uL (ref 150–400)
RBC: 4.52 MIL/uL (ref 3.87–5.11)
RDW: 16.6 % — ABNORMAL HIGH (ref 11.5–15.5)
WBC: 8.4 10*3/uL (ref 4.0–10.5)
nRBC: 0 % (ref 0.0–0.2)

## 2019-09-11 LAB — COMPREHENSIVE METABOLIC PANEL
ALT: 13 U/L (ref 0–44)
AST: 19 U/L (ref 15–41)
Albumin: 3.7 g/dL (ref 3.5–5.0)
Alkaline Phosphatase: 49 U/L (ref 38–126)
Anion gap: 10 (ref 5–15)
BUN: 19 mg/dL (ref 8–23)
CO2: 24 mmol/L (ref 22–32)
Calcium: 9.6 mg/dL (ref 8.9–10.3)
Chloride: 105 mmol/L (ref 98–111)
Creatinine, Ser: 1.18 mg/dL — ABNORMAL HIGH (ref 0.44–1.00)
GFR calc Af Amer: 52 mL/min — ABNORMAL LOW (ref >60)
GFR calc non Af Amer: 44 mL/min — ABNORMAL LOW (ref >60)
Glucose, Bld: 97 mg/dL (ref 70–99)
Potassium: 4 mmol/L (ref 3.5–5.1)
Sodium: 139 mmol/L (ref 135–145)
Total Bilirubin: 0.6 mg/dL (ref 0.3–1.2)
Total Protein: 7.1 g/dL (ref 6.5–8.1)

## 2019-09-11 LAB — URINALYSIS, ROUTINE W REFLEX MICROSCOPIC
Bacteria, UA: NONE SEEN
Bilirubin Urine: NEGATIVE
Glucose, UA: NEGATIVE mg/dL
Hgb urine dipstick: NEGATIVE
Ketones, ur: NEGATIVE mg/dL
Nitrite: NEGATIVE
Protein, ur: NEGATIVE mg/dL
Specific Gravity, Urine: 1.021 (ref 1.005–1.030)
pH: 5 (ref 5.0–8.0)

## 2019-09-11 NOTE — ED Provider Notes (Signed)
[facility] EMERGENCY DEPARTMENT Provider Note   CSN: [id number] Arrival date & time: 09/11/19  1001     History Chief Complaint  Patient presents with  . Chills    [name] is a 79 y.o. female.  LEVEL FIVE CAVEAT DUE TO POOR HISTORIAN  HPI HPI Comments: [name] is a 79 y.o. female with history of CVA, diabetes mellitus, HLD who presents to the Emergency Department via EMS complaining of chills.  Patient is an elderly cachectic female and sometimes has difficulty answering questions clearly.  One of her caregivers is at bedside and states that she has had chills for the past 2 weeks and generally "says she does not feel well".  She has a history of CVA with right-sided weakness.  She typically ambulates with a walker but has been experiencing a chronic wound to the posterior aspect of the ball of the left foot and does not want to ambulate due to pain.  Her caregiver states that she has been having normal bowel movements.  Her caregiver denies any other complaints at this time.     Past Medical History:  Diagnosis Date  . Diabetes mellitus   . Hypercholesteremia   . Hypertension   . Stroke [facility])     Patient Active Problem List   Diagnosis Date Noted  . Pain due to onychomycosis of toenails of both feet 11/28/2018  . Diabetes mellitus without complication ([facility]) 11/28/2018  . Rhabdomyolysis 01/13/2015  . HLD (hyperlipidemia) 01/13/2015  . Diabetes type 2, controlled ([facility]) 01/13/2015  . Faintness   . CVA (cerebral infarction) 10/25/2011  . UTI (lower urinary tract infection) 10/25/2011  . Dehydration 10/25/2011  . Syncope 10/23/2011  . H/O: CVA (cerebrovascular accident) 10/23/2011  . Hypertension 10/23/2011  . Diabetes mellitus ([facility]) 10/23/2011    Past Surgical History:  Procedure Laterality Date  . TOOTH EXTRACTION       OB History   No obstetric history on file.     Family History  Problem Relation Age of Onset  . Diabetes type  II Other   . Hypertension Other   . Breast cancer Neg Hx     Social History   Tobacco Use  . Smoking status: Never Smoker  . Smokeless tobacco: Never Used  Substance Use Topics  . Alcohol use: No  . Drug use: No    Home Medications Prior to Admission medications   Medication Sig Start Date End Date Taking? Authorizing Provider  amLODipine (NORVASC) 2.5 MG tablet Take 2.5 mg by mouth daily with breakfast. 06/25/19   [provider]  Calcium Carbonate-Vitamin D (CALCIUM-D PO) Take 1 tablet by mouth daily with breakfast.    [provider]  cephALEXin (KEFLEX) 500 MG capsule Take 1 capsule (500 mg total) by mouth 3 (three) times daily. 07/08/19   Derwood Kaplan, MD  dipyridamole-aspirin (AGGRENOX) 25-200 MG per 12 hr capsule Take 1 capsule by mouth 2 (two) times daily with a meal.     [provider]  hydrALAZINE (APRESOLINE) 25 MG tablet Take 25 mg by mouth 2 (two) times daily with a meal.  03/20/18   [provider]  latanoprost (XALATAN) 0.005 % ophthalmic solution Place 1 drop into both eyes at bedtime. Reported on 08/04/2015 12/07/14   [provider]  levETIRAcetam (KEPPRA) 500 MG tablet Take 1 tablet (500 mg total) by mouth 2 (two) times daily. Patient taking differently: Take 250-500 mg by mouth See admin instructions. Take 1/2 tablet (250  mg) by mouth daily with breakfast and 1 tablet (500 mg) with supper 01/16/15   Marlin Canary U, DO  lisinopril (PRINIVIL,ZESTRIL) 20 MG tablet Take 20 mg by mouth 2 (two) times daily with a meal.     [provider]  ondansetron (ZOFRAN ODT) 8 MG disintegrating tablet Take 1 tablet (8 mg total) by mouth every 8 (eight) hours as needed for nausea. 07/08/19   Derwood Kaplan, MD  oxybutynin (DITROPAN) 5 MG tablet Take 5 mg by mouth daily with breakfast.  03/27/18   [provider]  rosuvastatin (CRESTOR) 5 MG tablet Take 5 mg by mouth daily with breakfast.  08/22/16   [provider]     Allergies    Patient has no known allergies.  Review of Systems   Review of Systems  Unable to perform ROS: Other  All other systems reviewed and are negative.  Physical Exam Updated Vital Signs BP (!) 130/55   Pulse 77   Temp 98.6 F (37 C) (Oral)   Resp 16   SpO2 100%   Physical Exam Vitals and nursing note reviewed.  Constitutional:      General: She is not in acute distress.    Appearance: Normal appearance. She is not toxic-appearing or diaphoretic.     Comments: Cachectic elderly African-American female.  She appears to be significantly fatigued and weak.  She is oriented and answers questions clearly and appropriately.  HENT:     Head: Normocephalic and atraumatic.     Right Ear: External ear normal.     Left Ear: External ear normal.     Nose: Nose normal.     Mouth/Throat:     Mouth: Mucous membranes are dry.     Pharynx: Oropharynx is clear. No oropharyngeal exudate or posterior oropharyngeal erythema.  Eyes:     General: No scleral icterus.       Right eye: No discharge.        Left eye: No discharge.     Conjunctiva/sclera: Conjunctivae normal.     Pupils: Pupils are equal, round, and reactive to light.  Cardiovascular:     Rate and Rhythm: Normal rate and regular rhythm.     Pulses: Normal pulses.     Heart sounds: Normal heart sounds. No murmur. No friction rub. No gallop.   Pulmonary:     Effort: No respiratory distress.     Breath sounds: Normal breath sounds. No stridor. No wheezing, rhonchi or rales.     Comments: Poor inspiratory effort.  Lungs clear to auscultation bilaterally. Abdominal:     General: Abdomen is flat.     Tenderness: There is no abdominal tenderness. There is no guarding.     Comments: Abdomen is slightly rigid.  No focal tenderness.  No guarding or rebound.  Musculoskeletal:     Cervical back: Normal range of motion.     Right lower leg: No edema.     Left lower leg: No edema.  Skin:    General: Skin is warm and dry.      Comments: Well-healed sacral ulcers noted.  No TTP overlying the site.  No erythema or edema.  No discharge appreciated.  Please see image below.  Chronic growth and wound noted to the posterior heel of the left foot.  Exquisite tenderness overlying and surrounding the site.     ED Results / Procedures / Treatments   Labs (all labs ordered are listed, but only abnormal results are displayed) Labs Reviewed  COMPREHENSIVE  METABOLIC PANEL - Abnormal; Notable for the following components:      Result Value   Creatinine, Ser 1.18 (*)    GFR calc non Af Amer 44 (*)    GFR calc Af Amer 52 (*)    All other components within normal limits  CBC WITH DIFFERENTIAL/PLATELET - Abnormal; Notable for the following components:   RDW 16.6 (*)    All other components within normal limits  URINALYSIS, ROUTINE W REFLEX MICROSCOPIC - Abnormal; Notable for the following components:   Leukocytes,Ua TRACE (*)    All other components within normal limits  URINE CULTURE   EKG EKG Interpretation  Date/Time:  Tuesday September 11 2019 13:32:00 EDT Ventricular Rate:  70 PR Interval:    QRS Duration: 94 QT Interval:  371 QTC Calculation: 401 R Axis:   14 Text Interpretation: Sinus rhythm RSR' in V1 or V2, probably normal variant Borderline ST elevation, anterior leads similar to Feb 2021 Confirmed by Sherwood Gambler [phone]) on 09/11/2019 1:37:23 PM   Radiology No results found.  Procedures Procedures   Medications Ordered in ED Medications - No data to display  ED Course  I have reviewed the triage vital signs and the nursing notes.  Pertinent labs & imaging results that were available during my care of the patient were reviewed by me and considered in my medical decision making (see chart for details).    MDM Rules/Calculators/A&P                      Patient is an elderly cachectic 79 year old female with a lengthy medical history including CVA as well as an unhealed growth on the posterior  heel of her left foot.  One of her caregivers brought her to the emergency department today because she has been complaining of "chills" as well as "not feeling well".  She has a nurse who comes to her home to change the dressings on her left foot.  She has been evaluated by her primary care provider Dr. Deforest Hoyles regarding this wound.  Her caregiver states that she was ambulatory with a walker at baseline until she started experiencing this wound for the last 2 months.  Due to the patient's age and general state of health obtained basic labs.  Her labs are reassuring.  Her creatinine is slightly elevated at 1.18, though it is not acutely elevated based on prior values.  Patient was discussed with and evaluated by my attending physician Dr. Ralene Bathe who recommends that she follow-up with the wound center.  I have given she and her caregiver follow-up information as well as given her an ambulatory referral for the wound center.  I had the nurse rewrap her foot.  Her caregiver understands that she needs to follow-up with the wound center if they do not reach out and also to follow-up with her primary care provider regarding this visit.  She was given strict return precautions and knows to return to the emergency department with any new or worsening symptoms.  Her vital signs are stable.  Final Clinical Impression(s) / ED Diagnoses Final diagnoses:  Chills  Wound of foot    Rx / DC Orders ED Discharge Orders         Ordered    Ambulatory referral to Wound Clinic     09/11/19 1807           Rayna Sexton, PA-C 09/11/19 1859    Quintella Reichert, MD 09/14/19 [phone]

## 2019-09-11 NOTE — Discharge Instructions (Addendum)
Per our conversation, you have been given a referral to the wound clinic regarding your left foot.  They should be reaching out to you but please make sure to call them as well to schedule an appointment as soon as possible.  Please also follow-up with your primary care provider Dr. Donette Larry regarding this visit.  Please do not hesitate to return the emergency department with any new or worsening symptoms.  It was a pleasure to meet you.

## 2019-09-11 NOTE — ED Triage Notes (Signed)
Pt here for feeling "cold" after having second covid vaccine yesterday. Pt has no other complaints came for home via ems.

## 2019-09-11 NOTE — ED Notes (Signed)
Patient verbalizes understanding of discharge instructions. Opportunity for questioning and answers were provided. Armband removed by staff. Patient discharged from ED.  

## 2019-09-11 NOTE — ED Notes (Signed)
ST elevation noted on monitor, EKG taken, MD made aware, looks comparable to previous EKG.

## 2019-09-12 LAB — URINE CULTURE

## 2019-09-13 DIAGNOSIS — L89622 Pressure ulcer of left heel, stage 2: Secondary | ICD-10-CM | POA: Diagnosis not present

## 2019-09-13 DIAGNOSIS — I69351 Hemiplegia and hemiparesis following cerebral infarction affecting right dominant side: Secondary | ICD-10-CM | POA: Diagnosis not present

## 2019-09-13 DIAGNOSIS — I10 Essential (primary) hypertension: Secondary | ICD-10-CM | POA: Diagnosis not present

## 2019-09-13 DIAGNOSIS — Z9181 History of falling: Secondary | ICD-10-CM | POA: Diagnosis not present

## 2019-09-13 DIAGNOSIS — I69328 Other speech and language deficits following cerebral infarction: Secondary | ICD-10-CM | POA: Diagnosis not present

## 2019-09-13 DIAGNOSIS — M5030 Other cervical disc degeneration, unspecified cervical region: Secondary | ICD-10-CM | POA: Diagnosis not present

## 2019-09-13 DIAGNOSIS — E11319 Type 2 diabetes mellitus with unspecified diabetic retinopathy without macular edema: Secondary | ICD-10-CM | POA: Diagnosis not present

## 2019-09-13 DIAGNOSIS — R569 Unspecified convulsions: Secondary | ICD-10-CM | POA: Diagnosis not present

## 2019-09-13 DIAGNOSIS — E785 Hyperlipidemia, unspecified: Secondary | ICD-10-CM | POA: Diagnosis not present

## 2019-09-14 DIAGNOSIS — I69328 Other speech and language deficits following cerebral infarction: Secondary | ICD-10-CM | POA: Diagnosis not present

## 2019-09-14 DIAGNOSIS — E11319 Type 2 diabetes mellitus with unspecified diabetic retinopathy without macular edema: Secondary | ICD-10-CM | POA: Diagnosis not present

## 2019-09-14 DIAGNOSIS — I10 Essential (primary) hypertension: Secondary | ICD-10-CM | POA: Diagnosis not present

## 2019-09-14 DIAGNOSIS — Z9181 History of falling: Secondary | ICD-10-CM | POA: Diagnosis not present

## 2019-09-14 DIAGNOSIS — M5030 Other cervical disc degeneration, unspecified cervical region: Secondary | ICD-10-CM | POA: Diagnosis not present

## 2019-09-14 DIAGNOSIS — E785 Hyperlipidemia, unspecified: Secondary | ICD-10-CM | POA: Diagnosis not present

## 2019-09-14 DIAGNOSIS — R569 Unspecified convulsions: Secondary | ICD-10-CM | POA: Diagnosis not present

## 2019-09-14 DIAGNOSIS — I69351 Hemiplegia and hemiparesis following cerebral infarction affecting right dominant side: Secondary | ICD-10-CM | POA: Diagnosis not present

## 2019-09-14 DIAGNOSIS — L89622 Pressure ulcer of left heel, stage 2: Secondary | ICD-10-CM | POA: Diagnosis not present

## 2019-09-17 DIAGNOSIS — R569 Unspecified convulsions: Secondary | ICD-10-CM | POA: Diagnosis not present

## 2019-09-17 DIAGNOSIS — Z9181 History of falling: Secondary | ICD-10-CM | POA: Diagnosis not present

## 2019-09-17 DIAGNOSIS — L89622 Pressure ulcer of left heel, stage 2: Secondary | ICD-10-CM | POA: Diagnosis not present

## 2019-09-17 DIAGNOSIS — E11319 Type 2 diabetes mellitus with unspecified diabetic retinopathy without macular edema: Secondary | ICD-10-CM | POA: Diagnosis not present

## 2019-09-17 DIAGNOSIS — I10 Essential (primary) hypertension: Secondary | ICD-10-CM | POA: Diagnosis not present

## 2019-09-17 DIAGNOSIS — M5030 Other cervical disc degeneration, unspecified cervical region: Secondary | ICD-10-CM | POA: Diagnosis not present

## 2019-09-17 DIAGNOSIS — I69351 Hemiplegia and hemiparesis following cerebral infarction affecting right dominant side: Secondary | ICD-10-CM | POA: Diagnosis not present

## 2019-09-17 DIAGNOSIS — I69328 Other speech and language deficits following cerebral infarction: Secondary | ICD-10-CM | POA: Diagnosis not present

## 2019-09-17 DIAGNOSIS — E785 Hyperlipidemia, unspecified: Secondary | ICD-10-CM | POA: Diagnosis not present

## 2019-09-18 DIAGNOSIS — E113299 Type 2 diabetes mellitus with mild nonproliferative diabetic retinopathy without macular edema, unspecified eye: Secondary | ICD-10-CM | POA: Diagnosis not present

## 2019-09-18 DIAGNOSIS — H409 Unspecified glaucoma: Secondary | ICD-10-CM | POA: Diagnosis not present

## 2019-09-18 DIAGNOSIS — Z9181 History of falling: Secondary | ICD-10-CM | POA: Diagnosis not present

## 2019-09-18 DIAGNOSIS — L89622 Pressure ulcer of left heel, stage 2: Secondary | ICD-10-CM | POA: Diagnosis not present

## 2019-09-18 DIAGNOSIS — I10 Essential (primary) hypertension: Secondary | ICD-10-CM | POA: Diagnosis not present

## 2019-09-18 DIAGNOSIS — I69351 Hemiplegia and hemiparesis following cerebral infarction affecting right dominant side: Secondary | ICD-10-CM | POA: Diagnosis not present

## 2019-09-18 DIAGNOSIS — R569 Unspecified convulsions: Secondary | ICD-10-CM | POA: Diagnosis not present

## 2019-09-18 DIAGNOSIS — I639 Cerebral infarction, unspecified: Secondary | ICD-10-CM | POA: Diagnosis not present

## 2019-09-18 DIAGNOSIS — E785 Hyperlipidemia, unspecified: Secondary | ICD-10-CM | POA: Diagnosis not present

## 2019-09-18 DIAGNOSIS — G4700 Insomnia, unspecified: Secondary | ICD-10-CM | POA: Diagnosis not present

## 2019-09-18 DIAGNOSIS — N182 Chronic kidney disease, stage 2 (mild): Secondary | ICD-10-CM | POA: Diagnosis not present

## 2019-09-18 DIAGNOSIS — M5030 Other cervical disc degeneration, unspecified cervical region: Secondary | ICD-10-CM | POA: Diagnosis not present

## 2019-09-18 DIAGNOSIS — I69328 Other speech and language deficits following cerebral infarction: Secondary | ICD-10-CM | POA: Diagnosis not present

## 2019-09-18 DIAGNOSIS — M1990 Unspecified osteoarthritis, unspecified site: Secondary | ICD-10-CM | POA: Diagnosis not present

## 2019-09-18 DIAGNOSIS — E11319 Type 2 diabetes mellitus with unspecified diabetic retinopathy without macular edema: Secondary | ICD-10-CM | POA: Diagnosis not present

## 2019-09-18 DIAGNOSIS — E7800 Pure hypercholesterolemia, unspecified: Secondary | ICD-10-CM | POA: Diagnosis not present

## 2019-09-19 DIAGNOSIS — R569 Unspecified convulsions: Secondary | ICD-10-CM | POA: Diagnosis not present

## 2019-09-19 DIAGNOSIS — E785 Hyperlipidemia, unspecified: Secondary | ICD-10-CM | POA: Diagnosis not present

## 2019-09-19 DIAGNOSIS — Z9181 History of falling: Secondary | ICD-10-CM | POA: Diagnosis not present

## 2019-09-19 DIAGNOSIS — M5030 Other cervical disc degeneration, unspecified cervical region: Secondary | ICD-10-CM | POA: Diagnosis not present

## 2019-09-19 DIAGNOSIS — L89622 Pressure ulcer of left heel, stage 2: Secondary | ICD-10-CM | POA: Diagnosis not present

## 2019-09-19 DIAGNOSIS — I69328 Other speech and language deficits following cerebral infarction: Secondary | ICD-10-CM | POA: Diagnosis not present

## 2019-09-19 DIAGNOSIS — I10 Essential (primary) hypertension: Secondary | ICD-10-CM | POA: Diagnosis not present

## 2019-09-19 DIAGNOSIS — E11319 Type 2 diabetes mellitus with unspecified diabetic retinopathy without macular edema: Secondary | ICD-10-CM | POA: Diagnosis not present

## 2019-09-19 DIAGNOSIS — I69351 Hemiplegia and hemiparesis following cerebral infarction affecting right dominant side: Secondary | ICD-10-CM | POA: Diagnosis not present

## 2019-09-21 DIAGNOSIS — M5030 Other cervical disc degeneration, unspecified cervical region: Secondary | ICD-10-CM | POA: Diagnosis not present

## 2019-09-21 DIAGNOSIS — R569 Unspecified convulsions: Secondary | ICD-10-CM | POA: Diagnosis not present

## 2019-09-21 DIAGNOSIS — I69328 Other speech and language deficits following cerebral infarction: Secondary | ICD-10-CM | POA: Diagnosis not present

## 2019-09-21 DIAGNOSIS — I10 Essential (primary) hypertension: Secondary | ICD-10-CM | POA: Diagnosis not present

## 2019-09-21 DIAGNOSIS — E785 Hyperlipidemia, unspecified: Secondary | ICD-10-CM | POA: Diagnosis not present

## 2019-09-21 DIAGNOSIS — I69351 Hemiplegia and hemiparesis following cerebral infarction affecting right dominant side: Secondary | ICD-10-CM | POA: Diagnosis not present

## 2019-09-21 DIAGNOSIS — E11319 Type 2 diabetes mellitus with unspecified diabetic retinopathy without macular edema: Secondary | ICD-10-CM | POA: Diagnosis not present

## 2019-09-21 DIAGNOSIS — Z9181 History of falling: Secondary | ICD-10-CM | POA: Diagnosis not present

## 2019-09-21 DIAGNOSIS — L89622 Pressure ulcer of left heel, stage 2: Secondary | ICD-10-CM | POA: Diagnosis not present

## 2019-09-24 DIAGNOSIS — I69351 Hemiplegia and hemiparesis following cerebral infarction affecting right dominant side: Secondary | ICD-10-CM | POA: Diagnosis not present

## 2019-09-24 DIAGNOSIS — I10 Essential (primary) hypertension: Secondary | ICD-10-CM | POA: Diagnosis not present

## 2019-09-24 DIAGNOSIS — L89622 Pressure ulcer of left heel, stage 2: Secondary | ICD-10-CM | POA: Diagnosis not present

## 2019-09-24 DIAGNOSIS — E11319 Type 2 diabetes mellitus with unspecified diabetic retinopathy without macular edema: Secondary | ICD-10-CM | POA: Diagnosis not present

## 2019-09-24 DIAGNOSIS — R569 Unspecified convulsions: Secondary | ICD-10-CM | POA: Diagnosis not present

## 2019-09-24 DIAGNOSIS — Z9181 History of falling: Secondary | ICD-10-CM | POA: Diagnosis not present

## 2019-09-24 DIAGNOSIS — E785 Hyperlipidemia, unspecified: Secondary | ICD-10-CM | POA: Diagnosis not present

## 2019-09-24 DIAGNOSIS — M5030 Other cervical disc degeneration, unspecified cervical region: Secondary | ICD-10-CM | POA: Diagnosis not present

## 2019-09-24 DIAGNOSIS — I69328 Other speech and language deficits following cerebral infarction: Secondary | ICD-10-CM | POA: Diagnosis not present

## 2019-09-25 DIAGNOSIS — E785 Hyperlipidemia, unspecified: Secondary | ICD-10-CM | POA: Diagnosis not present

## 2019-09-25 DIAGNOSIS — Z9181 History of falling: Secondary | ICD-10-CM | POA: Diagnosis not present

## 2019-09-25 DIAGNOSIS — R569 Unspecified convulsions: Secondary | ICD-10-CM | POA: Diagnosis not present

## 2019-09-25 DIAGNOSIS — I10 Essential (primary) hypertension: Secondary | ICD-10-CM | POA: Diagnosis not present

## 2019-09-25 DIAGNOSIS — M5030 Other cervical disc degeneration, unspecified cervical region: Secondary | ICD-10-CM | POA: Diagnosis not present

## 2019-09-25 DIAGNOSIS — E11319 Type 2 diabetes mellitus with unspecified diabetic retinopathy without macular edema: Secondary | ICD-10-CM | POA: Diagnosis not present

## 2019-09-25 DIAGNOSIS — I69328 Other speech and language deficits following cerebral infarction: Secondary | ICD-10-CM | POA: Diagnosis not present

## 2019-09-25 DIAGNOSIS — L89622 Pressure ulcer of left heel, stage 2: Secondary | ICD-10-CM | POA: Diagnosis not present

## 2019-09-25 DIAGNOSIS — I69351 Hemiplegia and hemiparesis following cerebral infarction affecting right dominant side: Secondary | ICD-10-CM | POA: Diagnosis not present

## 2019-09-26 DIAGNOSIS — R569 Unspecified convulsions: Secondary | ICD-10-CM | POA: Diagnosis not present

## 2019-09-26 DIAGNOSIS — E11319 Type 2 diabetes mellitus with unspecified diabetic retinopathy without macular edema: Secondary | ICD-10-CM | POA: Diagnosis not present

## 2019-09-26 DIAGNOSIS — E785 Hyperlipidemia, unspecified: Secondary | ICD-10-CM | POA: Diagnosis not present

## 2019-09-26 DIAGNOSIS — I10 Essential (primary) hypertension: Secondary | ICD-10-CM | POA: Diagnosis not present

## 2019-09-26 DIAGNOSIS — L89622 Pressure ulcer of left heel, stage 2: Secondary | ICD-10-CM | POA: Diagnosis not present

## 2019-09-26 DIAGNOSIS — I69328 Other speech and language deficits following cerebral infarction: Secondary | ICD-10-CM | POA: Diagnosis not present

## 2019-09-26 DIAGNOSIS — M5030 Other cervical disc degeneration, unspecified cervical region: Secondary | ICD-10-CM | POA: Diagnosis not present

## 2019-09-26 DIAGNOSIS — I69351 Hemiplegia and hemiparesis following cerebral infarction affecting right dominant side: Secondary | ICD-10-CM | POA: Diagnosis not present

## 2019-09-26 DIAGNOSIS — Z9181 History of falling: Secondary | ICD-10-CM | POA: Diagnosis not present

## 2019-09-27 ENCOUNTER — Encounter (HOSPITAL_BASED_OUTPATIENT_CLINIC_OR_DEPARTMENT_OTHER): Payer: Medicare HMO | Attending: Internal Medicine | Admitting: Internal Medicine

## 2019-09-27 ENCOUNTER — Other Ambulatory Visit: Payer: Self-pay

## 2019-09-27 DIAGNOSIS — N182 Chronic kidney disease, stage 2 (mild): Secondary | ICD-10-CM | POA: Insufficient documentation

## 2019-09-27 DIAGNOSIS — Z9181 History of falling: Secondary | ICD-10-CM | POA: Diagnosis not present

## 2019-09-27 DIAGNOSIS — L97428 Non-pressure chronic ulcer of left heel and midfoot with other specified severity: Secondary | ICD-10-CM | POA: Insufficient documentation

## 2019-09-27 DIAGNOSIS — I69351 Hemiplegia and hemiparesis following cerebral infarction affecting right dominant side: Secondary | ICD-10-CM | POA: Diagnosis not present

## 2019-09-27 DIAGNOSIS — E785 Hyperlipidemia, unspecified: Secondary | ICD-10-CM | POA: Insufficient documentation

## 2019-09-27 DIAGNOSIS — I129 Hypertensive chronic kidney disease with stage 1 through stage 4 chronic kidney disease, or unspecified chronic kidney disease: Secondary | ICD-10-CM | POA: Insufficient documentation

## 2019-09-27 DIAGNOSIS — R569 Unspecified convulsions: Secondary | ICD-10-CM | POA: Diagnosis not present

## 2019-09-27 DIAGNOSIS — E1122 Type 2 diabetes mellitus with diabetic chronic kidney disease: Secondary | ICD-10-CM | POA: Diagnosis not present

## 2019-09-27 DIAGNOSIS — E11621 Type 2 diabetes mellitus with foot ulcer: Secondary | ICD-10-CM | POA: Diagnosis not present

## 2019-09-27 DIAGNOSIS — I10 Essential (primary) hypertension: Secondary | ICD-10-CM | POA: Diagnosis not present

## 2019-09-27 DIAGNOSIS — L97422 Non-pressure chronic ulcer of left heel and midfoot with fat layer exposed: Secondary | ICD-10-CM | POA: Diagnosis not present

## 2019-09-27 DIAGNOSIS — M5030 Other cervical disc degeneration, unspecified cervical region: Secondary | ICD-10-CM | POA: Diagnosis not present

## 2019-09-27 DIAGNOSIS — E11319 Type 2 diabetes mellitus with unspecified diabetic retinopathy without macular edema: Secondary | ICD-10-CM | POA: Diagnosis not present

## 2019-09-27 DIAGNOSIS — I69328 Other speech and language deficits following cerebral infarction: Secondary | ICD-10-CM | POA: Diagnosis not present

## 2019-09-27 DIAGNOSIS — L89622 Pressure ulcer of left heel, stage 2: Secondary | ICD-10-CM | POA: Diagnosis not present

## 2019-09-27 NOTE — Progress Notes (Addendum)
[name], [place] ([id number]) Visit Report for 09/27/2019 Abuse/Suicide Risk Screen Details Patient Name: Date of Service: [place], [place] 09/27/2019 1:15 PM Medical Record Number: [id number] Patient Account Number: [id number] Date of Birth/Sex: Treating RN: 1940/06/02 (79 y.o. Harvest Dark Primary Care Maizee Reinhold: Georgann Housekeeper Other Clinician: Shawn Stall Referring Kalep Full: Treating Dwayne Bulkley/Extender: Sherryll Burger in Treatment: 0 Abuse/Suicide Risk Screen Items Answer ABUSE RISK SCREEN: Has anyone close to you tried to hurt or harm you recentlyo No Do you feel uncomfortable with anyone in your familyo No Has anyone forced you do things that you didnt want to doo No Electronic Signature(s) Signed: 10/11/2019 7:04:08 PM By: Shawn Stall Signed: 10/18/2019 11:23:30 AM By: Shawn Stall Signed: 10/22/2019 1:52:43 PM By: Cherylin Mylar Previous Signature: 09/27/2019 5:19:50 PM Version By: Cherylin Mylar Entered By: Shawn Stall on 10/11/2019 19:04:08 -------------------------------------------------------------------------------- Activities of Daily Living Details Patient Name: Date of Service: [name], [place]. 09/27/2019 1:15 PM Medical Record Number: [id number] Patient Account Number: [id number] Date of Birth/Sex: Treating RN: 18-Jan-1941 (79 y.o. Harvest Dark Primary Care Johnpatrick Jenny: Georgann Housekeeper Other Clinician: Shawn Stall Referring Vernita Tague: Treating Gevork Ayyad/Extender: Sherryll Burger in Treatment: 0 Activities of Daily Living Items Answer Activities of Daily Living (Please select one for each item) Drive Automobile Not Able T Medications ake Need Assistance Use T elephone Need Assistance Care for Appearance Need Assistance Use T oilet Need Assistance Bath / Shower Need Assistance Dress Self Need Assistance Feed Self Need Assistance Walk Need Assistance Get In / Out Bed Need Assistance Housework  Not Able Prepare Meals Not Able Handle Money Not Able Shop for Self Not Able Electronic Signature(s) Signed: 10/11/2019 7:04:24 PM By: Shawn Stall Signed: 10/18/2019 11:23:30 AM By: Shawn Stall Signed: 10/22/2019 1:52:43 PM By: Cherylin Mylar Previous Signature: 09/27/2019 5:19:50 PM Version By: Cherylin Mylar Entered By: Shawn Stall on 10/11/2019 19:04:24 -------------------------------------------------------------------------------- Education Screening Details Patient Name: Date of Service: [name], [place] C. 09/27/2019 1:15 PM Medical Record Number: [id number] Patient Account Number: [id number] Date of Birth/Sex: Treating RN: May 26, 1941 (79 y.o. Harvest Dark Primary Care Belkis Norbeck: Georgann Housekeeper Other Clinician: Shawn Stall Referring Hala Narula: Treating Danelle Curiale/Extender: Sherryll Burger in Treatment: 0 Primary Learner Assessed: Caregiver both patient and caregiver Reason Patient is not Primary Learner: periods of forgetfulness Learning Preferences/Education Level/Primary Language Learning Preference: Explanation Highest Education Level: College or Above Preferred Language: English Cognitive Barrier Language Barrier: No Translator Needed: No Memory Deficit: No Emotional Barrier: No Cultural/Religious Beliefs Affecting Medical Care: No Physical Barrier Impaired Vision: No Impaired Hearing: No Decreased Hand dexterity: No Knowledge/Comprehension Knowledge Level: High Comprehension Level: High Ability to understand written instructions: High Ability to understand verbal instructions: High Motivation Anxiety Level: Calm Cooperation: Cooperative Education Importance: Acknowledges Need Interest in Health Problems: Uninterested Perception: Coherent Willingness to Engage in Self-Management Medium Activities: Readiness to Engage in Self-Management Medium Activities: Electronic Signature(s) Signed: 10/11/2019 7:04:59 PM By: Shawn Stall Signed: 10/18/2019 11:23:30 AM By: Shawn Stall Signed: 10/22/2019 1:52:43 PM By: Cherylin Mylar Previous Signature: 09/27/2019 5:19:50 PM Version By: Cherylin Mylar Entered By: Shawn Stall on 10/11/2019 19:04:58 -------------------------------------------------------------------------------- Fall Risk Assessment Details Patient Name: Date of Service: [name] Santos, MA RY C. 09/27/2019 1:15 PM Medical Record Number: [id number] Patient Account Number: [id number] Date of Birth/Sex: Treating RN: 09/01/1940 (79 y.o. Harvest Dark Primary Care Brookelyn Gaynor: Georgann Housekeeper Other Clinician: Shawn Stall Referring Shuan Statzer: Treating Boston Catarino/Extender: Sherryll Burger in Treatment: 0 Fall Risk Assessment Items  Have you had 2 or more falls in the last 12 monthso 0 No Have you had any fall that resulted in injury in the last 12 monthso 0 No FALLS RISK SCREEN History of falling - immediate or within 3 months 0 No Secondary diagnosis (Do you have 2 or more medical diagnoseso) 15 Yes Ambulatory aid None/bed rest/wheelchair/nurse 0 Yes Crutches/cane/walker 0 No Furniture 0 No Intravenous therapy Access/Saline/Heparin Lock 0 No Gait/Transferring Normal/ bed rest/ wheelchair 0 Yes Weak (short steps with or without shuffle, stooped but able to lift head while walking, may seek 0 No support from furniture) Impaired (short steps with shuffle, may have difficulty arising from chair, head down, impaired 0 No balance) Mental Status Oriented to own ability 0 Yes Electronic Signature(s) Signed: 10/11/2019 7:05:14 PM By: Deon Pilling Signed: 10/18/2019 11:23:30 AM By: Deon Pilling Signed: 10/22/2019 1:52:43 PM By: Kela Millin Previous Signature: 09/27/2019 5:19:50 PM Version By: Kela Millin Entered By: Deon Pilling on 10/11/2019 19:05:14 -------------------------------------------------------------------------------- Foot Assessment Details Patient  Name: Date of Service: [name], [place] C. 09/27/2019 1:15 PM Medical Record Number: [id number] Patient Account Number: [id number] Date of Birth/Sex: Treating RN: 14-Aug-1940 (79 y.o. Clearnce Sorrel Primary Care Taleeyah Bora: Wenda Low Other Clinician: Deon Pilling Referring Chareese Sergent: Treating Aum Caggiano/Extender: Bryon Lions in Treatment: 0 Foot Assessment Items Site Locations + = Sensation present, - = Sensation absent, C = Callus, U = Ulcer R = Redness, W = Warmth, M = Maceration, PU = Pre-ulcerative lesion F = Fissure, S = Swelling, D = Dryness Assessment Right: Left: Other Deformity: No No Prior Foot Ulcer: No No Prior Amputation: No No Charcot Joint: No No Ambulatory Status: Non-ambulatory Assistance Device: Wheelchair Gait: Buyer, retail Signature(s) Signed: 10/11/2019 7:07:16 PM By: Deon Pilling Signed: 10/18/2019 11:23:30 AM By: Deon Pilling Signed: 10/22/2019 1:52:43 PM By: Kela Millin Previous Signature: 09/27/2019 5:19:50 PM Version By: Kela Millin Entered By: Deon Pilling on 10/11/2019 19:07:16 -------------------------------------------------------------------------------- Nutrition Risk Screening Details Patient Name: Date of Service: [name], [place] C. 09/27/2019 1:15 PM Medical Record Number: [id number] Patient Account Number: [id number] Date of Birth/Sex: Treating RN: October 30, 1940 (79 y.o. Clearnce Sorrel Primary Care Costantino Kohlbeck: Wenda Low Other Clinician: Deon Pilling Referring Eugene Isadore: Treating Arvetta Araque/Extender: Bryon Lions in Treatment: 0 Height (in): Weight (lbs): Body Mass Index (BMI): Nutrition Risk Screening Items Score Screening NUTRITION RISK SCREEN: I have an illness or condition that made me change the kind and/or amount of food I eat 0 No I eat fewer than two meals per day 0 No I eat few fruits and vegetables, or milk products 0 No I have three or more  drinks of beer, liquor or wine almost every day 0 No I have tooth or mouth problems that make it hard for me to eat 0 No I don't always have enough money to buy the food I need 0 No I eat alone most of the time 0 No I take three or more different prescribed or over-the-counter drugs a day 1 Yes Without wanting to, I have lost or gained 10 pounds in the last six months 2 Yes I am not always physically able to shop, cook and/or feed myself 0 No Nutrition Protocols Good Risk Protocol Moderate Risk Protocol 0 Provide education on nutrition High Risk Proctocol Risk Level: Moderate Risk Score: 3 Electronic Signature(s) Signed: 10/11/2019 7:06:57 PM By: Deon Pilling Signed: 10/18/2019 11:23:30 AM By: Deon Pilling Signed: 10/22/2019 1:52:43 PM By: Kela Millin Previous Signature: 09/27/2019 5:19:50 PM  Version By: Cherylin Mylar Entered By: Shawn Stall on 10/11/2019 19:06:57

## 2019-09-29 DIAGNOSIS — I10 Essential (primary) hypertension: Secondary | ICD-10-CM | POA: Diagnosis not present

## 2019-09-29 DIAGNOSIS — I69328 Other speech and language deficits following cerebral infarction: Secondary | ICD-10-CM | POA: Diagnosis not present

## 2019-09-29 DIAGNOSIS — I69351 Hemiplegia and hemiparesis following cerebral infarction affecting right dominant side: Secondary | ICD-10-CM | POA: Diagnosis not present

## 2019-09-29 DIAGNOSIS — M5030 Other cervical disc degeneration, unspecified cervical region: Secondary | ICD-10-CM | POA: Diagnosis not present

## 2019-09-29 DIAGNOSIS — E785 Hyperlipidemia, unspecified: Secondary | ICD-10-CM | POA: Diagnosis not present

## 2019-09-29 DIAGNOSIS — L89622 Pressure ulcer of left heel, stage 2: Secondary | ICD-10-CM | POA: Diagnosis not present

## 2019-09-29 DIAGNOSIS — Z9181 History of falling: Secondary | ICD-10-CM | POA: Diagnosis not present

## 2019-09-29 DIAGNOSIS — E11319 Type 2 diabetes mellitus with unspecified diabetic retinopathy without macular edema: Secondary | ICD-10-CM | POA: Diagnosis not present

## 2019-09-29 DIAGNOSIS — R569 Unspecified convulsions: Secondary | ICD-10-CM | POA: Diagnosis not present

## 2019-10-01 DIAGNOSIS — R569 Unspecified convulsions: Secondary | ICD-10-CM | POA: Diagnosis not present

## 2019-10-01 DIAGNOSIS — I10 Essential (primary) hypertension: Secondary | ICD-10-CM | POA: Diagnosis not present

## 2019-10-01 DIAGNOSIS — I69351 Hemiplegia and hemiparesis following cerebral infarction affecting right dominant side: Secondary | ICD-10-CM | POA: Diagnosis not present

## 2019-10-01 DIAGNOSIS — I69328 Other speech and language deficits following cerebral infarction: Secondary | ICD-10-CM | POA: Diagnosis not present

## 2019-10-01 DIAGNOSIS — M5030 Other cervical disc degeneration, unspecified cervical region: Secondary | ICD-10-CM | POA: Diagnosis not present

## 2019-10-01 DIAGNOSIS — L89622 Pressure ulcer of left heel, stage 2: Secondary | ICD-10-CM | POA: Diagnosis not present

## 2019-10-01 DIAGNOSIS — E785 Hyperlipidemia, unspecified: Secondary | ICD-10-CM | POA: Diagnosis not present

## 2019-10-01 DIAGNOSIS — Z9181 History of falling: Secondary | ICD-10-CM | POA: Diagnosis not present

## 2019-10-01 DIAGNOSIS — E11319 Type 2 diabetes mellitus with unspecified diabetic retinopathy without macular edema: Secondary | ICD-10-CM | POA: Diagnosis not present

## 2019-10-03 DIAGNOSIS — R569 Unspecified convulsions: Secondary | ICD-10-CM | POA: Diagnosis not present

## 2019-10-03 DIAGNOSIS — I69351 Hemiplegia and hemiparesis following cerebral infarction affecting right dominant side: Secondary | ICD-10-CM | POA: Diagnosis not present

## 2019-10-03 DIAGNOSIS — E11319 Type 2 diabetes mellitus with unspecified diabetic retinopathy without macular edema: Secondary | ICD-10-CM | POA: Diagnosis not present

## 2019-10-03 DIAGNOSIS — L89622 Pressure ulcer of left heel, stage 2: Secondary | ICD-10-CM | POA: Diagnosis not present

## 2019-10-03 DIAGNOSIS — I69328 Other speech and language deficits following cerebral infarction: Secondary | ICD-10-CM | POA: Diagnosis not present

## 2019-10-03 DIAGNOSIS — Z9181 History of falling: Secondary | ICD-10-CM | POA: Diagnosis not present

## 2019-10-03 DIAGNOSIS — I10 Essential (primary) hypertension: Secondary | ICD-10-CM | POA: Diagnosis not present

## 2019-10-03 DIAGNOSIS — E785 Hyperlipidemia, unspecified: Secondary | ICD-10-CM | POA: Diagnosis not present

## 2019-10-03 DIAGNOSIS — M5030 Other cervical disc degeneration, unspecified cervical region: Secondary | ICD-10-CM | POA: Diagnosis not present

## 2019-10-04 ENCOUNTER — Encounter (HOSPITAL_BASED_OUTPATIENT_CLINIC_OR_DEPARTMENT_OTHER): Payer: Medicare HMO | Attending: Internal Medicine | Admitting: Internal Medicine

## 2019-10-04 ENCOUNTER — Other Ambulatory Visit: Payer: Self-pay

## 2019-10-04 DIAGNOSIS — I129 Hypertensive chronic kidney disease with stage 1 through stage 4 chronic kidney disease, or unspecified chronic kidney disease: Secondary | ICD-10-CM | POA: Diagnosis not present

## 2019-10-04 DIAGNOSIS — M5030 Other cervical disc degeneration, unspecified cervical region: Secondary | ICD-10-CM | POA: Diagnosis not present

## 2019-10-04 DIAGNOSIS — R6889 Other general symptoms and signs: Secondary | ICD-10-CM | POA: Diagnosis not present

## 2019-10-04 DIAGNOSIS — E11621 Type 2 diabetes mellitus with foot ulcer: Secondary | ICD-10-CM | POA: Insufficient documentation

## 2019-10-04 DIAGNOSIS — Z8673 Personal history of transient ischemic attack (TIA), and cerebral infarction without residual deficits: Secondary | ICD-10-CM | POA: Diagnosis not present

## 2019-10-04 DIAGNOSIS — N182 Chronic kidney disease, stage 2 (mild): Secondary | ICD-10-CM | POA: Diagnosis not present

## 2019-10-04 DIAGNOSIS — L97428 Non-pressure chronic ulcer of left heel and midfoot with other specified severity: Secondary | ICD-10-CM | POA: Diagnosis not present

## 2019-10-04 DIAGNOSIS — I69328 Other speech and language deficits following cerebral infarction: Secondary | ICD-10-CM | POA: Diagnosis not present

## 2019-10-04 DIAGNOSIS — E11319 Type 2 diabetes mellitus with unspecified diabetic retinopathy without macular edema: Secondary | ICD-10-CM | POA: Diagnosis not present

## 2019-10-04 DIAGNOSIS — I69351 Hemiplegia and hemiparesis following cerebral infarction affecting right dominant side: Secondary | ICD-10-CM | POA: Diagnosis not present

## 2019-10-04 DIAGNOSIS — E1122 Type 2 diabetes mellitus with diabetic chronic kidney disease: Secondary | ICD-10-CM | POA: Insufficient documentation

## 2019-10-04 DIAGNOSIS — R569 Unspecified convulsions: Secondary | ICD-10-CM | POA: Diagnosis not present

## 2019-10-04 DIAGNOSIS — L89622 Pressure ulcer of left heel, stage 2: Secondary | ICD-10-CM | POA: Diagnosis not present

## 2019-10-04 DIAGNOSIS — Z9181 History of falling: Secondary | ICD-10-CM | POA: Diagnosis not present

## 2019-10-04 DIAGNOSIS — I10 Essential (primary) hypertension: Secondary | ICD-10-CM | POA: Diagnosis not present

## 2019-10-04 DIAGNOSIS — E785 Hyperlipidemia, unspecified: Secondary | ICD-10-CM | POA: Diagnosis not present

## 2019-10-04 NOTE — Progress Notes (Signed)
[name], [place] ([id number]) Visit Report for 10/04/2019 HPI Details Patient Name: Date of Service: [place], [place] 10/04/2019 2:00 PM Medical Record Number: [id number] Patient Account Number: [id number] Date of Birth/Sex: Treating RN: 02-May-1941 (79 y.o. Debby Bud Primary Care Provider: Wenda Low Other Clinician: Referring Provider: Treating Provider/Extender: Lajean Silvius, Fransisca Connors in Treatment: 1 History of Present Illness HPI Description: ADMISSION 09/27/2019; this is a 79 year old woman who is a type II diabetic but with a well-controlled recent hemoglobin A1c of 5.5. Roughly 2 months ago she developed a blister on her left heel. She was in the ER on 07/27/2019 and there is a picture of this at which time a sizable wound. This is mostly healed but a small open area remains. They are here for our review of this. Past medical history includes hypertension, type 2 diabetes, stage II chronic kidney disease, hypercholesterolemia and a remote CVA. The patient is dilatory ABI is 0.98 on the left 5/6; patient's wound on the tip of her left heel. This is superficial and a lot better than when she came in here a week ago. She is using silver alginate. Electronic Signature(s) Signed: 10/04/2019 5:30:42 PM By: Linton Ham MD Entered By: Linton Ham on 10/04/2019 15:27:13 -------------------------------------------------------------------------------- Physical Exam Details Patient Name: Date of Service: [name], [name] [place] C. 10/04/2019 2:00 PM Medical Record Number: [id number] Patient Account Number: [id number] Date of Birth/Sex: Treating RN: 30-Jun-1940 (79 y.o. Debby Bud Primary Care Provider: Wenda Low Other Clinician: Referring Provider: Treating Provider/Extender: Bryon Lions in Treatment: 1 Constitutional Sitting or standing Blood Pressure is within target range for patient.. Pulse regular and within target range for patient.Marland Kitchen  Respirations regular, non-labored and within target range.. Temperature is normal and within the target range for the patient.Marland Kitchen Appears in no distress. Cardiovascular Pedal pulses are easily palpable. Notes Wound exam; the area in question is on the tip of the left heel. This is superficial as opposed to last time. She had a rim of hyper granulated tissue that I removed with a scalpel and used silver nitrate. This is now a very superficial wound but on its way to fully epithelializing. Electronic Signature(s) Signed: 10/04/2019 5:30:42 PM By: Linton Ham MD Entered By: Linton Ham on 10/04/2019 15:28:14 -------------------------------------------------------------------------------- Physician Orders Details Patient Name: Date of Service: [name] Doffing, MA RY C. 10/04/2019 2:00 PM Medical Record Number: [id number] Patient Account Number: [id number] Date of Birth/Sex: Treating RN: 1941-04-04 (79 y.o. 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 Dressing Change Frequency Change dressing three times week. - twice my home health. once by wound center. Wound Cleansing May shower and wash wound with soap and water. - with dressing changes only. Primary Wound Dressing Wound #1 Left Calcaneus Calcium Alginate with Silver Secondary Dressing Wound #1 Left Calcaneus Kerlix/Rolled Gauze Dry Gauze Heel Cup Edema Control Elevate legs to the level of the heart or above for 30 minutes daily and/or when sitting, a frequency of: - throughout the day. Off-Loading Other: - float heels while resting in bed  or chair. patient to use offloading heel crates as well. [facility] skilled nursing for wound care. - [facility] to change twice a week. Electronic Signature(s) Signed: 10/04/2019 5:30:42 PM By: Baltazar Najjar MD Signed: 10/04/2019 5:34:26 PM By: Shawn Stall Entered By: Shawn Stall on 10/04/2019 15:07:31 -------------------------------------------------------------------------------- Problem List Details Patient Name: Date of Service: [name] Santos, MA RY C. 10/04/2019 2:00 PM Medical Record Number: [id number] Patient Account Number: [id number] Date of Birth/Sex: Treating RN: 24-Jun-1940 (79 y.o. 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 10/04/2019 2:00 PM Medical Record Number: [id number] Patient Account Number: [id number] Date of Birth/Sex: Treating RN: 10/12/1940 (79 y.o. [name] Primary Care Provider: Georgann Housekeeper Other Clinician: Referring Provider: Treating Provider/Extender: Georgana Curio, Derald Macleod in Treatment: 1 Subjective History of Present Illness (HPI) ADMISSION 09/27/2019; this is a 79 year old woman who is a type II diabetic but with a well-controlled recent hemoglobin A1c of 5.5. Roughly 2 months ago she developed a blister on  her left heel. She was in the ER on 07/27/2019 and there is a picture of this at which time a sizable wound. This is mostly healed but a small open area remains. They are here for our review of this. Past medical history includes hypertension, type 2 diabetes, stage II chronic kidney disease, hypercholesterolemia and a remote CVA. The patient is dilatory ABI is 0.98 on the left 5/6; patient's wound on the tip of her left heel. This is superficial and a lot better than when she came in here a week ago. She is using silver alginate. Objective Constitutional Sitting or standing Blood Pressure is within target range for patient.. Pulse regular and within target range for patient.Marland Kitchen Respirations regular, non-labored and within target range.. Temperature is normal and within the target range for the patient.Marland Kitchen Appears in no distress. Vitals Time Taken: 2:27 PM, Temperature: 97.6 F, Pulse: 71 bpm, Respiratory Rate: 18 breaths/min, Blood Pressure: 131/49 mmHg, Capillary Blood Glucose: 120 mg/dl. General Notes: glucose per cg report Cardiovascular Pedal pulses are easily palpable. General Notes: Wound exam; the area in question is on the tip of the left heel. This is superficial as opposed to last time. She had a rim of hyper granulated tissue that I removed with a scalpel and used silver nitrate. This is now a very superficial wound but on its way to fully epithelializing. Integumentary (Hair, Skin) Wound #1 status is Open. Original cause of wound was Blister. The wound is located on the Left Calcaneus. The wound measures 0.4cm length x 0.3cm width x 0.1cm depth; 0.094cm^2 area and 0.009cm^3 volume. There is Fat Layer (Subcutaneous Tissue) Exposed exposed. There is no tunneling or undermining noted. There is a small amount of serosanguineous drainage noted. The wound margin is distinct with the outline attached to the wound base. There is large (67- 100%) pink granulation within the wound bed. There is no  necrotic tissue within the wound bed. 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 Dressing Change  Frequency: Change dressing three times week. - twice my home health. once by wound center. Wound Cleansing: May shower and wash wound with soap and water. - with dressing changes only. Primary Wound Dressing: Wound #1 Left Calcaneus: Calcium Alginate with Silver Secondary Dressing: Wound #1 Left Calcaneus: Kerlix/Rolled Gauze Dry Gauze Heel Cup Edema Control: Elevate legs to the level of the heart or above for 30 minutes daily and/or when sitting, a frequency of: - throughout the day. Off-Loading: Other: - float heels while resting in bed or chair. patient to use offloading heel crates as well. Home Health: [facility] skilled nursing for wound care. - [facility] to change twice a week. 1. Left calcaneus. Very superficial wound which is tiny. Continue with silver alginate. Hopefully closed by next week. 2. She is not very ambulatory spends a lot of time in a wheelchair although she has heel offloading boots at home. She was urged to continue with all the offloading activities. Electronic Signature(s) Signed: 10/04/2019 5:30:42 PM By: Baltazar Najjar MD Entered By: Baltazar Najjar on 10/04/2019 15:29:04 -------------------------------------------------------------------------------- SuperBill Details Patient Name: Date of Service: [name], [place] C. 10/04/2019 Medical Record Number: [id number] Patient Account Number: [id number] Date of Birth/Sex: Treating RN: 1941-04-10 (79 y.o. 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 Facility Procedures The patient participates with Medicare or their insurance follows the Medicare Facility Guidelines: CPT4 Code Description Modifier Quantity [id number] - WOUND CARE VISIT-LEV 4 EST PT 1 Physician Procedures : CPT4 Code Description Modifier [id number] - WC PHYS LEVEL 3 - EST PT ICD-10 Diagnosis Description E11.621 Type 2 diabetes mellitus with foot ulcer 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

## 2019-10-05 DIAGNOSIS — R569 Unspecified convulsions: Secondary | ICD-10-CM | POA: Diagnosis not present

## 2019-10-05 DIAGNOSIS — I10 Essential (primary) hypertension: Secondary | ICD-10-CM | POA: Diagnosis not present

## 2019-10-05 DIAGNOSIS — L89622 Pressure ulcer of left heel, stage 2: Secondary | ICD-10-CM | POA: Diagnosis not present

## 2019-10-05 DIAGNOSIS — E785 Hyperlipidemia, unspecified: Secondary | ICD-10-CM | POA: Diagnosis not present

## 2019-10-05 DIAGNOSIS — Z9181 History of falling: Secondary | ICD-10-CM | POA: Diagnosis not present

## 2019-10-05 DIAGNOSIS — M5030 Other cervical disc degeneration, unspecified cervical region: Secondary | ICD-10-CM | POA: Diagnosis not present

## 2019-10-05 DIAGNOSIS — E11319 Type 2 diabetes mellitus with unspecified diabetic retinopathy without macular edema: Secondary | ICD-10-CM | POA: Diagnosis not present

## 2019-10-05 DIAGNOSIS — I69328 Other speech and language deficits following cerebral infarction: Secondary | ICD-10-CM | POA: Diagnosis not present

## 2019-10-05 DIAGNOSIS — I69351 Hemiplegia and hemiparesis following cerebral infarction affecting right dominant side: Secondary | ICD-10-CM | POA: Diagnosis not present

## 2019-10-08 DIAGNOSIS — Z9181 History of falling: Secondary | ICD-10-CM | POA: Diagnosis not present

## 2019-10-08 DIAGNOSIS — I10 Essential (primary) hypertension: Secondary | ICD-10-CM | POA: Diagnosis not present

## 2019-10-08 DIAGNOSIS — L89622 Pressure ulcer of left heel, stage 2: Secondary | ICD-10-CM | POA: Diagnosis not present

## 2019-10-08 DIAGNOSIS — M5030 Other cervical disc degeneration, unspecified cervical region: Secondary | ICD-10-CM | POA: Diagnosis not present

## 2019-10-08 DIAGNOSIS — I69351 Hemiplegia and hemiparesis following cerebral infarction affecting right dominant side: Secondary | ICD-10-CM | POA: Diagnosis not present

## 2019-10-08 DIAGNOSIS — R569 Unspecified convulsions: Secondary | ICD-10-CM | POA: Diagnosis not present

## 2019-10-08 DIAGNOSIS — I69328 Other speech and language deficits following cerebral infarction: Secondary | ICD-10-CM | POA: Diagnosis not present

## 2019-10-08 DIAGNOSIS — E11319 Type 2 diabetes mellitus with unspecified diabetic retinopathy without macular edema: Secondary | ICD-10-CM | POA: Diagnosis not present

## 2019-10-08 DIAGNOSIS — E785 Hyperlipidemia, unspecified: Secondary | ICD-10-CM | POA: Diagnosis not present

## 2019-10-09 DIAGNOSIS — L89622 Pressure ulcer of left heel, stage 2: Secondary | ICD-10-CM | POA: Diagnosis not present

## 2019-10-09 DIAGNOSIS — I69351 Hemiplegia and hemiparesis following cerebral infarction affecting right dominant side: Secondary | ICD-10-CM | POA: Diagnosis not present

## 2019-10-09 DIAGNOSIS — Z9181 History of falling: Secondary | ICD-10-CM | POA: Diagnosis not present

## 2019-10-09 DIAGNOSIS — E785 Hyperlipidemia, unspecified: Secondary | ICD-10-CM | POA: Diagnosis not present

## 2019-10-09 DIAGNOSIS — I69328 Other speech and language deficits following cerebral infarction: Secondary | ICD-10-CM | POA: Diagnosis not present

## 2019-10-09 DIAGNOSIS — E11319 Type 2 diabetes mellitus with unspecified diabetic retinopathy without macular edema: Secondary | ICD-10-CM | POA: Diagnosis not present

## 2019-10-09 DIAGNOSIS — R569 Unspecified convulsions: Secondary | ICD-10-CM | POA: Diagnosis not present

## 2019-10-09 DIAGNOSIS — M5030 Other cervical disc degeneration, unspecified cervical region: Secondary | ICD-10-CM | POA: Diagnosis not present

## 2019-10-09 DIAGNOSIS — I10 Essential (primary) hypertension: Secondary | ICD-10-CM | POA: Diagnosis not present

## 2019-10-10 DIAGNOSIS — R569 Unspecified convulsions: Secondary | ICD-10-CM | POA: Diagnosis not present

## 2019-10-10 DIAGNOSIS — Z9181 History of falling: Secondary | ICD-10-CM | POA: Diagnosis not present

## 2019-10-10 DIAGNOSIS — E785 Hyperlipidemia, unspecified: Secondary | ICD-10-CM | POA: Diagnosis not present

## 2019-10-10 DIAGNOSIS — I69351 Hemiplegia and hemiparesis following cerebral infarction affecting right dominant side: Secondary | ICD-10-CM | POA: Diagnosis not present

## 2019-10-10 DIAGNOSIS — I10 Essential (primary) hypertension: Secondary | ICD-10-CM | POA: Diagnosis not present

## 2019-10-10 DIAGNOSIS — M5030 Other cervical disc degeneration, unspecified cervical region: Secondary | ICD-10-CM | POA: Diagnosis not present

## 2019-10-10 DIAGNOSIS — E11319 Type 2 diabetes mellitus with unspecified diabetic retinopathy without macular edema: Secondary | ICD-10-CM | POA: Diagnosis not present

## 2019-10-10 DIAGNOSIS — I69328 Other speech and language deficits following cerebral infarction: Secondary | ICD-10-CM | POA: Diagnosis not present

## 2019-10-10 DIAGNOSIS — L89622 Pressure ulcer of left heel, stage 2: Secondary | ICD-10-CM | POA: Diagnosis not present

## 2019-10-11 ENCOUNTER — Encounter (HOSPITAL_BASED_OUTPATIENT_CLINIC_OR_DEPARTMENT_OTHER): Payer: Medicare HMO | Admitting: Internal Medicine

## 2019-10-11 DIAGNOSIS — I10 Essential (primary) hypertension: Secondary | ICD-10-CM | POA: Diagnosis not present

## 2019-10-11 DIAGNOSIS — M5030 Other cervical disc degeneration, unspecified cervical region: Secondary | ICD-10-CM | POA: Diagnosis not present

## 2019-10-11 DIAGNOSIS — Z9181 History of falling: Secondary | ICD-10-CM | POA: Diagnosis not present

## 2019-10-11 DIAGNOSIS — E11319 Type 2 diabetes mellitus with unspecified diabetic retinopathy without macular edema: Secondary | ICD-10-CM | POA: Diagnosis not present

## 2019-10-11 DIAGNOSIS — I69328 Other speech and language deficits following cerebral infarction: Secondary | ICD-10-CM | POA: Diagnosis not present

## 2019-10-11 DIAGNOSIS — R569 Unspecified convulsions: Secondary | ICD-10-CM | POA: Diagnosis not present

## 2019-10-11 DIAGNOSIS — I69351 Hemiplegia and hemiparesis following cerebral infarction affecting right dominant side: Secondary | ICD-10-CM | POA: Diagnosis not present

## 2019-10-11 DIAGNOSIS — E785 Hyperlipidemia, unspecified: Secondary | ICD-10-CM | POA: Diagnosis not present

## 2019-10-11 DIAGNOSIS — L89622 Pressure ulcer of left heel, stage 2: Secondary | ICD-10-CM | POA: Diagnosis not present

## 2019-10-12 DIAGNOSIS — E11319 Type 2 diabetes mellitus with unspecified diabetic retinopathy without macular edema: Secondary | ICD-10-CM | POA: Diagnosis not present

## 2019-10-12 DIAGNOSIS — I69328 Other speech and language deficits following cerebral infarction: Secondary | ICD-10-CM | POA: Diagnosis not present

## 2019-10-12 DIAGNOSIS — I10 Essential (primary) hypertension: Secondary | ICD-10-CM | POA: Diagnosis not present

## 2019-10-12 DIAGNOSIS — I69351 Hemiplegia and hemiparesis following cerebral infarction affecting right dominant side: Secondary | ICD-10-CM | POA: Diagnosis not present

## 2019-10-12 DIAGNOSIS — Z9181 History of falling: Secondary | ICD-10-CM | POA: Diagnosis not present

## 2019-10-12 DIAGNOSIS — L89622 Pressure ulcer of left heel, stage 2: Secondary | ICD-10-CM | POA: Diagnosis not present

## 2019-10-12 DIAGNOSIS — R569 Unspecified convulsions: Secondary | ICD-10-CM | POA: Diagnosis not present

## 2019-10-12 DIAGNOSIS — E785 Hyperlipidemia, unspecified: Secondary | ICD-10-CM | POA: Diagnosis not present

## 2019-10-12 DIAGNOSIS — M5030 Other cervical disc degeneration, unspecified cervical region: Secondary | ICD-10-CM | POA: Diagnosis not present

## 2019-10-15 DIAGNOSIS — I69328 Other speech and language deficits following cerebral infarction: Secondary | ICD-10-CM | POA: Diagnosis not present

## 2019-10-15 DIAGNOSIS — I10 Essential (primary) hypertension: Secondary | ICD-10-CM | POA: Diagnosis not present

## 2019-10-15 DIAGNOSIS — E11319 Type 2 diabetes mellitus with unspecified diabetic retinopathy without macular edema: Secondary | ICD-10-CM | POA: Diagnosis not present

## 2019-10-15 DIAGNOSIS — L89622 Pressure ulcer of left heel, stage 2: Secondary | ICD-10-CM | POA: Diagnosis not present

## 2019-10-15 DIAGNOSIS — Z9181 History of falling: Secondary | ICD-10-CM | POA: Diagnosis not present

## 2019-10-15 DIAGNOSIS — M5030 Other cervical disc degeneration, unspecified cervical region: Secondary | ICD-10-CM | POA: Diagnosis not present

## 2019-10-15 DIAGNOSIS — I69351 Hemiplegia and hemiparesis following cerebral infarction affecting right dominant side: Secondary | ICD-10-CM | POA: Diagnosis not present

## 2019-10-15 DIAGNOSIS — E785 Hyperlipidemia, unspecified: Secondary | ICD-10-CM | POA: Diagnosis not present

## 2019-10-15 DIAGNOSIS — R569 Unspecified convulsions: Secondary | ICD-10-CM | POA: Diagnosis not present

## 2019-10-16 DIAGNOSIS — I69328 Other speech and language deficits following cerebral infarction: Secondary | ICD-10-CM | POA: Diagnosis not present

## 2019-10-16 DIAGNOSIS — I639 Cerebral infarction, unspecified: Secondary | ICD-10-CM | POA: Diagnosis not present

## 2019-10-16 DIAGNOSIS — I10 Essential (primary) hypertension: Secondary | ICD-10-CM | POA: Diagnosis not present

## 2019-10-16 DIAGNOSIS — E7800 Pure hypercholesterolemia, unspecified: Secondary | ICD-10-CM | POA: Diagnosis not present

## 2019-10-16 DIAGNOSIS — H409 Unspecified glaucoma: Secondary | ICD-10-CM | POA: Diagnosis not present

## 2019-10-16 DIAGNOSIS — Z9181 History of falling: Secondary | ICD-10-CM | POA: Diagnosis not present

## 2019-10-16 DIAGNOSIS — G4700 Insomnia, unspecified: Secondary | ICD-10-CM | POA: Diagnosis not present

## 2019-10-16 DIAGNOSIS — E785 Hyperlipidemia, unspecified: Secondary | ICD-10-CM | POA: Diagnosis not present

## 2019-10-16 DIAGNOSIS — N182 Chronic kidney disease, stage 2 (mild): Secondary | ICD-10-CM | POA: Diagnosis not present

## 2019-10-16 DIAGNOSIS — M5030 Other cervical disc degeneration, unspecified cervical region: Secondary | ICD-10-CM | POA: Diagnosis not present

## 2019-10-16 DIAGNOSIS — R569 Unspecified convulsions: Secondary | ICD-10-CM | POA: Diagnosis not present

## 2019-10-16 DIAGNOSIS — E113299 Type 2 diabetes mellitus with mild nonproliferative diabetic retinopathy without macular edema, unspecified eye: Secondary | ICD-10-CM | POA: Diagnosis not present

## 2019-10-16 DIAGNOSIS — M1990 Unspecified osteoarthritis, unspecified site: Secondary | ICD-10-CM | POA: Diagnosis not present

## 2019-10-16 DIAGNOSIS — I69351 Hemiplegia and hemiparesis following cerebral infarction affecting right dominant side: Secondary | ICD-10-CM | POA: Diagnosis not present

## 2019-10-16 DIAGNOSIS — L89622 Pressure ulcer of left heel, stage 2: Secondary | ICD-10-CM | POA: Diagnosis not present

## 2019-10-16 DIAGNOSIS — E11319 Type 2 diabetes mellitus with unspecified diabetic retinopathy without macular edema: Secondary | ICD-10-CM | POA: Diagnosis not present

## 2019-10-18 ENCOUNTER — Encounter (HOSPITAL_BASED_OUTPATIENT_CLINIC_OR_DEPARTMENT_OTHER): Payer: Medicare HMO | Admitting: Internal Medicine

## 2019-10-19 ENCOUNTER — Encounter (HOSPITAL_BASED_OUTPATIENT_CLINIC_OR_DEPARTMENT_OTHER): Payer: Medicare HMO | Admitting: Internal Medicine

## 2019-10-19 DIAGNOSIS — E785 Hyperlipidemia, unspecified: Secondary | ICD-10-CM | POA: Diagnosis not present

## 2019-10-19 DIAGNOSIS — M5030 Other cervical disc degeneration, unspecified cervical region: Secondary | ICD-10-CM | POA: Diagnosis not present

## 2019-10-19 DIAGNOSIS — I69328 Other speech and language deficits following cerebral infarction: Secondary | ICD-10-CM | POA: Diagnosis not present

## 2019-10-19 DIAGNOSIS — E11319 Type 2 diabetes mellitus with unspecified diabetic retinopathy without macular edema: Secondary | ICD-10-CM | POA: Diagnosis not present

## 2019-10-19 DIAGNOSIS — Z9181 History of falling: Secondary | ICD-10-CM | POA: Diagnosis not present

## 2019-10-19 DIAGNOSIS — I69351 Hemiplegia and hemiparesis following cerebral infarction affecting right dominant side: Secondary | ICD-10-CM | POA: Diagnosis not present

## 2019-10-19 DIAGNOSIS — R569 Unspecified convulsions: Secondary | ICD-10-CM | POA: Diagnosis not present

## 2019-10-19 DIAGNOSIS — L89622 Pressure ulcer of left heel, stage 2: Secondary | ICD-10-CM | POA: Diagnosis not present

## 2019-10-19 DIAGNOSIS — I10 Essential (primary) hypertension: Secondary | ICD-10-CM | POA: Diagnosis not present

## 2019-10-22 NOTE — Progress Notes (Addendum)
FIONNUALA, HEMMERICH ([id number]) Visit Report for 10/04/2019 Arrival Information Details Patient Name: Date of Service: [place], [place] 10/04/2019 2:00 PM Medical Record Number: [id number] Patient Account Number: [id number] Date of Birth/Sex: Treating RN: Mar 03, 1941 (79 y.o. Wynelle Link Primary Care Brennyn Ortlieb: Georgann Housekeeper Other Clinician: Referring Finas Delone: Treating Atlas Crossland/Extender: Sherryll Burger in Treatment: 1 Visit Information History Since Last Visit Added or deleted any medications: No Patient Arrived: Wheel Chair Any new allergies or adverse reactions: No Arrival Time: 14:27 Had a fall or experienced change in No Accompanied By: sister activities of daily living that may affect Transfer Assistance: None risk of falls: Patient Identification Verified: Yes Signs or symptoms of abuse/neglect since last visito No Secondary Verification Process Completed: Yes Hospitalized since last visit: No Patient Requires Transmission-Based Precautions: No Implantable device outside of the clinic excluding No Patient Has Alerts: No cellular tissue based products placed in the center since last visit: Has Dressing in Place as Prescribed: Yes Pain Present Now: No Electronic Signature(s) Signed: 10/04/2019 5:16:23 PM By: Zandra Abts RN, BSN Entered By: Zandra Abts on 10/04/2019 14:27:41 -------------------------------------------------------------------------------- Clinic Level of Care Assessment Details Patient Name: Date of Service: Jerl Santos, [place] C. 10/04/2019 2:00 PM Medical Record Number: [id number] Patient Account Number: [id number] Date of Birth/Sex: Treating RN: Aug 22, 1940 (79 y.o. Debara Pickett, [place] Primary Care Kamryn Messineo: Georgann Housekeeper Other Clinician: Referring Corban Kistler: Treating Abyan Cadman/Extender: Sherryll Burger in Treatment: 1 Clinic Level of Care Assessment Items TOOL 4 Quantity Score X- 1 0 Use when only an EandM is  performed on FOLLOW-UP visit ASSESSMENTS - Nursing Assessment / Reassessment X- 1 10 Reassessment of Co-morbidities (includes updates in patient status) X- 1 5 Reassessment of Adherence to Treatment Plan ASSESSMENTS - Wound and Skin A ssessment / Reassessment X - Simple Wound Assessment / Reassessment - one wound 1 5 []  - 0 Complex Wound Assessment / Reassessment - multiple wounds X- 1 10 Dermatologic / Skin Assessment (not related to wound area) ASSESSMENTS - Focused Assessment X- 1 5 Circumferential Edema Measurements - multi extremities X- 1 10 Nutritional Assessment / Counseling / Intervention []  - 0 Lower Extremity Assessment (monofilament, tuning fork, pulses) []  - 0 Peripheral Arterial Disease Assessment (using hand held doppler) ASSESSMENTS - Ostomy and/or Continence Assessment and Care []  - 0 Incontinence Assessment and Management []  - 0 Ostomy Care Assessment and Management (repouching, etc.) PROCESS - Coordination of Care X - Simple Patient / Family Education for ongoing care 1 15 []  - 0 Complex (extensive) Patient / Family Education for ongoing care X- 1 10 Staff obtains , Records, T Results / Process Orders est X- 1 10 Staff telephones HHA, Nursing Homes / Clarify orders / etc []  - 0 Routine Transfer to another Facility (non-emergent condition) []  - 0 Routine Hospital Admission (non-emergent condition) []  - 0 New Admissions / / Ordering NPWT Apligraf, etc. , []  - 0 Emergency Hospital Admission (emergent condition) X- 1 10 Simple Discharge Coordination []  - 0 Complex (extensive) Discharge Coordination PROCESS - Special Needs []  - 0 Pediatric / Minor Patient Management []  - 0 Isolation Patient Management []  - 0 Hearing / Language / Visual special needs []  - 0 Assessment of Community assistance (transportation, D/C planning, etc.) []  - 0 Additional assistance / Altered mentation []  - 0 Support Surface(s) Assessment  (bed, cushion, seat, etc.) INTERVENTIONS - Wound Cleansing / Measurement X - Simple Wound Cleansing - one wound 1 5 []  - 0 Complex Wound Cleansing -  multiple wounds X- 1 5 Wound Imaging (photographs - any number of wounds) []  - 0 Wound Tracing (instead of photographs) X- 1 5 Simple Wound Measurement - one wound []  - 0 Complex Wound Measurement - multiple wounds INTERVENTIONS - Wound Dressings X - Small Wound Dressing one or multiple wounds 1 10 []  - 0 Medium Wound Dressing one or multiple wounds []  - 0 Large Wound Dressing one or multiple wounds []  - 0 Application of Medications - topical []  - 0 Application of Medications - injection INTERVENTIONS - Miscellaneous []  - 0 External ear exam []  - 0 Specimen Collection (cultures, biopsies, blood, body fluids, etc.) []  - 0 Specimen(s) / Culture(s) sent or taken to Lab for analysis []  - 0 Patient Transfer (multiple staff / / Similar devices) []  - 0 Simple Staple / Suture removal (25 or less) []  - 0 Complex Staple / Suture removal (26 or more) []  - 0 Hypo / Hyperglycemic Management (close monitor of Blood Glucose) []  - 0 Ankle / Brachial Index (ABI) - do not check if billed separately X- 1 5 Vital Signs Has the patient been seen at the hospital within the last three years: Yes Total Score: 120 Level Of Care: New/Established - Level 4 Electronic Signature(s) Signed: 10/04/2019 5:34:26 PM By: Entered By: on 10/04/2019 17:19:32 -------------------------------------------------------------------------------- Encounter Discharge Information Details Patient Name: Date of Service: , [place] RY C. 10/04/2019 2:00 PM Medical Record Number: Patient Account Number: Date of Birth/Sex: Treating RN: 1940-07-29 (79 y.o. Primary Care Johnnette Laux: Other Clinician: Referring Conley Delisle: Treating Yelina Sarratt/Extender: in Treatment: 1 Encounter Discharge Information Items Discharge Condition: Stable Ambulatory Status: Wheelchair Discharge Destination: Home Transportation: Private Auto Accompanied By: caregiver Schedule Follow-up Appointment: Yes Clinical Summary of Care: Patient Declined Notes transportation service Electronic Signature(s) Signed: 10/04/2019 5:01:19 PM By: 12/04/2019 RN, BSN Entered By: Shawn Stall on 10/04/2019 15:26:00 -------------------------------------------------------------------------------- Lower Extremity Assessment Details Patient Name: Date of Service: 12/04/2019, Jerl Santos C. 10/04/2019 2:00 PM Medical Record Number: [id number] Patient Account Number: [id number] Date of Birth/Sex: Treating RN: 1940/07/20 (79 y.o. Tommye Standard Primary Care Alexis Mizuno: Georgann Housekeeper Other Clinician: Referring Sedric Guia: Treating Yanessa Hocevar/Extender: Sherryll Burger in Treatment: 1 Edema Assessment Assessed: 12/04/2019: No] Zenaida Deed: No] Edema: [Left: N] [Right: o] Calf Left: Right: Point of Measurement: cm From Medial Instep 26.5 cm cm Ankle Left: Right: Point of Measurement: cm From Medial Instep 19 cm cm Vascular Assessment Pulses: Dorsalis Pedis Palpable: [Left:Yes] Electronic Signature(s) Signed: 10/04/2019 5:16:23 PM By: 12/04/2019 RN, BSN Entered By: Jerl Santos on 10/04/2019 14:28:31 -------------------------------------------------------------------------------- Multi Wound Chart Details Patient Name: Date of Service: 12/04/2019, [place] RY C. 10/04/2019 2:00 PM Medical Record Number: [id number] Patient Account Number: 03/17/1941 Date of Birth/Sex: Treating RN: Dec 30, 1940 (79 y.o. 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] [N/A:N/A] Secondary Etiology: [1:Hypertension, Type II Diabetes] [N/A:N/A] Comorbid History: [1:07/25/2019] [N/A:N/A] Date Acquired: [1:1] [N/A:N/A] Weeks of Treatment: [1:Open] [N/A:N/A] Wound Status: [1:0.4x0.3x0.1] [N/A:N/A] Measurements L x W x D (cm) [1:0.094] [N/A:N/A] A (cm) : rea [1:0.009] [N/A:N/A] Volume (cm) : [  1:60.20%] [N/A:N/A] % Reduction in A rea: [1:62.50%] [N/A:N/A] % Reduction in Volume: [1:Grade 2] [N/A:N/A] Classification: [1:Small] [N/A:N/A] Exudate A mount: [1:Serosanguineous] [N/A:N/A] Exudate Type: [1:red, brown] [N/A:N/A] Exudate Color: [1:Distinct, outline attached] [N/A:N/A] Wound Margin: [1:Large (67-100%)] [N/A:N/A] Granulation A mount: [1:Pink] [N/A:N/A] Granulation Quality: [1:None Present (0%)] [N/A:N/A] Necrotic A mount: [1:Fat Layer (Subcutaneous Tissue)] [N/A:N/A] Exposed Structures: [1:Exposed: Yes Fascia: No Tendon: No Muscle: No Joint: No Bone: No Medium (34-66%)] [N/A:N/A] Treatment Notes Wound #1 (Left Calcaneus) 3. Primary Dressing Applied Calcium Alginate Ag 4. Secondary Dressing Dry Gauze Roll Gauze Heel Cup 5. Secured With Other (specify in notes) Notes Horticulturist, commercial) Signed: 10/04/2019 5:30:42 PM By: Linton Ham MD Signed: 10/22/2019 1:34:25 PM By: Deon Pilling Entered By: Linton Ham on 10/04/2019 15:26:41 -------------------------------------------------------------------------------- Multi-Disciplinary Care Plan Details Patient Name: Date of Service: Karrie [place]off[place], [place] RY C. 10/04/2019 2:00 PM Medical Record Number: [id number] Patient Account Number: [id number] Date of  Birth/Sex: Treating RN: 10/07/1940 (79 y.o. Debby Bud Primary Care Alawna Graybeal: Wenda Low Other Clinician: Referring Leilanie Rauda: Treating Zyanya Glaza/Extender: Bryon Lions in Treatment: 1 Active Inactive Electronic Signature(s) Signed: 10/31/2019 12:31:45 PM By: Deon Pilling Previous Signature: 10/04/2019 5:34:26 PM Version By: Deon Pilling Previous Signature: 10/22/2019 1:34:25 PM Version By: Deon Pilling Entered By: Deon Pilling on 10/31/2019 12:31:44 -------------------------------------------------------------------------------- Pain Assessment Details Patient Name: Date of Service: Karrie Doff[place], [place] C. 10/04/2019 2:00 PM Medical Record Number: [id number] Patient Account Number: [id number] Date of Birth/Sex: Treating RN: [birth year] (79 y.o. Nancy Fetter Primary Care Aeralyn Barna: Wenda Low Other Clinician: Referring Mckena Chern: Treating Rylin Seavey/Extender: Bryon Lions in Treatment: 1 Active Problems Location of Pain Severity and Description of Pain Patient Has Paino No Site Locations Pain Management and Medication Current Pain Management: Electronic Signature(s) Signed: 10/04/2019 5:16:23 PM By: Levan Hurst RN, BSN Entered By: Levan Hurst on 10/04/2019 14:28:25 -------------------------------------------------------------------------------- Patient/Caregiver Education Details Patient Name: Date of Service: Karrie Doffing, Frederik Schmidt 5/6/2021andnbsp2:00 PM Medical Record Number: [id number] Patient Account Number: [id number] Date of Birth/Gender: Treating RN: February 05, 1941 (78 y.o. Debby Bud Primary Care Physician: Wenda Low Other Clinician: Referring Physician: Treating Physician/Extender: Bryon Lions in Treatment: 1 Education Assessment Education Provided To: Patient Education Topics Provided Elevated Blood Sugar/ Impact on Healing: Handouts: Elevated Blood Sugars: How Do They  Affect Wound Healing Methods: Explain/Verbal Responses: Reinforcements needed Electronic Signature(s) Signed: 10/04/2019 5:34:26 PM By: Deon Pilling Entered By: Deon Pilling on 10/04/2019 14:45:43 -------------------------------------------------------------------------------- Wound Assessment Details Patient Name: Date of Service: Karrie [place]off[place], [place] C. 10/04/2019 2:00 PM Medical Record Number: [id number] Patient Account Number: [id number] Date of Birth/Sex: Treating RN: [birth year] (79 y.o. 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 Location: Left Calcaneus Secondary Etiology: Pressure Ulcer Wounding Event: Blister Wound Status: Open Date Acquired: 07/25/2019 Comorbid History: Hypertension, Type II Diabetes Weeks Of Treatment: 1 Clustered Wound: No Photos Photo Uploaded By: Mikeal Hawthorne on 10/05/2019 10:55:14 Wound Measurements Length: (cm) 0.4 Width: (cm) 0.3 Depth: (cm) 0.1 Area: (cm) 0.094 Volume: (cm) 0.009 % Reduction in Area: 60.2% % Reduction in Volume: 62.5% Epithelialization: Medium (34-66%) Tunneling: No Undermining: No Wound Description Classification: Grade 2 Wound Margin: Distinct, outline attached Exudate Amount: Small Exudate Type: Serosanguineous Exudate Color: red, brown Foul Odor After Cleansing: No Slough/Fibrino No Wound Bed Granulation Amount: Large (67-100%) Exposed Structure Granulation Quality: Pink  Fascia Exposed: No Necrotic Amount: None Present (0%) Fat Layer (Subcutaneous Tissue) Exposed: Yes Tendon Exposed: No Muscle Exposed: No Joint Exposed: No Bone Exposed: No Electronic Signature(s) Signed: 10/04/2019 5:16:23 PM By: Zandra Abts RN, BSN Entered By: Zandra Abts on 10/04/2019  14:28:49 -------------------------------------------------------------------------------- Vitals Details Patient Name: Date of Service: Jerl Santos, [place] RY C. 10/04/2019 2:00 PM Medical Record Number: [id number] Patient Account Number: [id number] Date of Birth/Sex: Treating RN: [birth year] (79 y.o. Wynelle Link Primary Care Britzy Graul: Georgann Housekeeper Other Clinician: Referring Lashawnta Burgert: Treating Rawleigh Rode/Extender: Sherryll Burger in Treatment: 1 Vital Signs Time Taken: 14:27 Temperature (F): 97.6 Pulse (bpm): 71 Respiratory Rate (breaths/min): 18 Blood Pressure (mmHg): 131/49 Capillary Blood Glucose (mg/dl): 275 Reference Range: 80 - 120 mg / dl Notes glucose per cg report Electronic Signature(s) Signed: 10/04/2019 5:16:23 PM By: Zandra Abts RN, BSN Entered By: Zandra Abts on 10/04/2019 14:28:20

## 2019-10-22 NOTE — Progress Notes (Signed)
BLONNIE, MASKE ([id number]) Visit Report for 09/27/2019 Allergy List Details Patient Name: Date of Service: [place], [place] 09/27/2019 1:15 PM Medical Record Number: [id number] Patient Account Number: [id number] Date of Birth/Sex: Treating RN: [birth year] (79 y.o. Clearnce Sorrel Primary Care Provider: Wenda Low Other Clinician: Deon Pilling Referring Provider: Treating Provider/Extender: Lajean Silvius, Fransisca Connors in Treatment: 0 Allergies Active Allergies No Known Allergies Allergy Notes Electronic Signature(s) Signed: 10/11/2019 7:02:12 PM By: Deon Pilling Signed: 10/18/2019 11:23:30 AM By: Deon Pilling Previous Signature: 09/27/2019 5:19:50 PM Version By: Kela Millin Entered By: Deon Pilling on 10/11/2019 19:02:12 -------------------------------------------------------------------------------- Arrival Information Details Patient Name: Date of Service: Karrie Doff[place], [place] C. 09/27/2019 1:15 PM Medical Record Number: [id number] Patient Account Number: [id number] Date of Birth/Sex: Treating RN: Dec 07, 1940 (79 y.o. Clearnce Sorrel Primary Care Provider: Wenda Low Other Clinician: Deon Pilling Referring Provider: Treating Provider/Extender: Bryon Lions in Treatment: 0 Visit Information Patient Arrived: Wheel Chair Arrival Time: 14:00 Accompanied By: caregiver Transfer Assistance: None Patient Identification Verified: Yes Secondary Verification Process Completed: Yes Electronic Signature(s) Signed: 10/11/2019 7:00:22 PM By: Deon Pilling Signed: 10/18/2019 11:23:30 AM By: Deon Pilling Previous Signature: 09/27/2019 5:19:50 PM Version By: Kela Millin Entered By: Deon Pilling on 10/11/2019 19:00:22 -------------------------------------------------------------------------------- Clinic Level of Care Assessment Details Patient Name: Date of Service: Karrie D[place], [place]. 09/27/2019 1:15 PM Medical Record Number:  [id number] Patient Account Number: [id number] Date of Birth/Sex: Treating RN: Oct 19, 1940 (79 y.o. Debby Bud Primary Care Provider: Wenda Low Other Clinician: Referring Provider: Treating Provider/Extender: Bryon Lions in Treatment: 0 Clinic Level of Care Assessment Items TOOL 1 Quantity Score X- 1 0 Use when EandM and Procedure is performed on INITIAL visit ASSESSMENTS - Nursing Assessment / Reassessment X- 1 20 General Physical Exam (combine w/ comprehensive assessment (listed just below) when performed on new pt. evals) X- 1 25 Comprehensive Assessment (HX, ROS, Risk Assessments, Wounds Hx, etc.) ASSESSMENTS - Wound and Skin Assessment / Reassessment X- 1 10 Dermatologic / Skin Assessment (not related to wound area) ASSESSMENTS - Ostomy and/or Continence Assessment and Care '[]'  - 0 Incontinence Assessment and Management '[]'  - 0 Ostomy Care Assessment and Management (repouching, etc.) PROCESS - Coordination of Care X - Simple Patient / Family Education for ongoing care 1 15 '[]'  - 0 Complex (extensive) Patient / Family Education for ongoing care X- 1 10 Staff obtains Programmer, systems, Records, T Results / Process Orders est X- 1 10 Staff telephones HHA, Nursing Homes / Clarify orders / etc '[]'  - 0 Routine Transfer to another Facility (non-emergent condition) '[]'  - 0 Routine Hospital Admission (non-emergent condition) X- 1 15 New Admissions / Biomedical engineer / Ordering NPWT Apligraf, etc. , '[]'  - 0 Emergency Hospital Admission (emergent condition) PROCESS - Special Needs '[]'  - 0 Pediatric / Minor Patient Management '[]'  - 0 Isolation Patient Management '[]'  - 0 Hearing / Language / Visual special needs '[]'  - 0 Assessment of Community assistance (transportation, D/C planning, etc.) '[]'  - 0 Additional assistance / Altered mentation '[]'  - 0 Support Surface(s) Assessment (bed, cushion, seat, etc.) INTERVENTIONS - Miscellaneous '[]'  -  0 External ear exam '[]'  - 0 Patient Transfer (multiple staff / Civil Service fast streamer / Similar devices) '[]'  - 0 Simple Staple / Suture removal (25 or less) '[]'  - 0 Complex Staple / Suture removal (26 or more) '[]'  - 0 Hypo/Hyperglycemic Management (do not check if billed separately) X- 1 15 Ankle / Brachial Index (ABI) - do not  check if billed separately Has the patient been seen at the hospital within the last three years: Yes Total Score: 120 Level Of Care: New/Established - Level 4 Electronic Signature(s) Signed: 09/27/2019 5:33:37 PM By: Deon Pilling Signed: 10/09/2019 2:49:20 PM By: Deon Pilling Signed: 10/18/2019 11:23:30 AM By: Deon Pilling Entered By: Deon Pilling on 09/27/2019 15:29:03 -------------------------------------------------------------------------------- Lower Extremity Assessment Details Patient Name: Date of Service: Karrie Doffing, [place]. 09/27/2019 1:15 PM Medical Record Number: [id number] Patient Account Number: [id number] Date of Birth/Sex: Treating RN: February 17, 1941 (79 y.o. Clearnce Sorrel Primary Care Provider: Wenda Low Other Clinician: Deon Pilling Referring Provider: Treating Provider/Extender: Bryon Lions in Treatment: 0 Edema Assessment Assessed: Shirlyn Goltz: No] Patrice Paradise: No] E[Left: dema] [Right: :] Calf Left: Right: Point of Measurement: cm From Medial Instep 26.5 cm cm Ankle Left: Right: Point of Measurement: cm From Medial Instep 19 cm cm Vascular Assessment Pulses: Dorsalis Pedis Palpable: [Left:Yes] Blood Pressure: Brachial: [Left:165] Ankle: [Left:Dorsalis Pedis: 162 0.98] Electronic Signature(s) Signed: 10/11/2019 7:08:15 PM By: Deon Pilling Signed: 10/18/2019 11:23:30 AM By: Deon Pilling Signed: 10/22/2019 1:52:43 PM By: Kela Millin Previous Signature: 09/27/2019 5:19:50 PM Version By: Kela Millin Entered By: Deon Pilling on 10/11/2019  19:08:14 -------------------------------------------------------------------------------- Multi Wound Chart Details Patient Name: Date of Service: Karrie [place], [place]. 09/27/2019 1:15 PM Medical Record Number: [id number] Patient Account Number: [id number] Date of Birth/Sex: Treating RN: 12/07/1940 (79 y.o. 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] [N/A:N/A] Secondary Etiology: [1:Hypertension, Type II Diabetes] [N/A:N/A] Comorbid History: [1:07/25/2019] [N/A:N/A] Date Acquired: [1:0] [N/A:N/A] Weeks of Treatment: [1:Open] [N/A:N/A] Wound Status: [1:0.5x0.6x0.1] [N/A:N/A] Measurements L x W x D (cm) [1:0.236] [N/A:N/A] A (cm) : rea [1:0.024] [N/A:N/A] Volume (cm) : [1:0.00%] [N/A:N/A] % Reduction in A [1:rea: 0.00%] [N/A:N/A] % Reduction in Volume: [1:Grade 2] [N/A:N/A] Classification: [1:Small] [N/A:N/A] Exudate A mount: [1:Serosanguineous] [N/A:N/A] Exudate Type: [1:red, brown] [N/A:N/A] Exudate Color: [1:Distinct, outline attached] [N/A:N/A] Wound Margin: [1:Large (67-100%)] [N/A:N/A] Granulation A mount: [1:Red, Pink] [N/A:N/A] Granulation Quality: [1:None Present (0%)] [N/A:N/A] Necrotic A mount: [1:Fat Layer (Subcutaneous Tissue)] [N/A:N/A] Exposed Structures: [1:Exposed: Yes Fascia: No Tendon: No Muscle: No Joint: No Bone: No None] [N/A:N/A] Epithelialization: [1:Debridement - Excisional] [N/A:N/A] Debridement: Pre-procedure Verification/Time Out 15:05 [N/A:N/A] Taken: [1:Lidocaine  4% T opical Solution] [N/A:N/A] Pain Control: [1:Subcutaneous] [N/A:N/A] Tissue Debrided: [1:Skin/Subcutaneous Tissue] [N/A:N/A] Level: [1:0.3] [N/A:N/A] Debridement A (sq cm): [1:rea Blade] [N/A:N/A] Instrument: [1:Moderate] [N/A:N/A] Bleeding: [1:Silver Nitrate] [N/A:N/A] Hemostasis A chieved: [1:0] [N/A:N/A] Procedural Pain: [1:3] [N/A:N/A] Post Procedural Pain: [1:Procedure was tolerated well] [N/A:N/A] Debridement Treatment Response: [1:0.5x0.6x0.1] [N/A:N/A] Post Debridement Measurements L x W x D (cm) [1:0.024] [N/A:N/A] Post Debridement Volume: (cm) [1:Debridement] [N/A:N/A] Treatment Notes Electronic Signature(s) Signed: 09/27/2019 5:41:26 PM By: Linton Ham MD Signed: 10/22/2019 1:30:32 PM By: Deon Pilling Entered By: Linton Ham on 09/27/2019 15:23:07 -------------------------------------------------------------------------------- Multi-Disciplinary Care Plan Details Patient Name: Date of Service: Karrie [place], [place]. 09/27/2019 1:15 PM Medical Record Number: [id number] Patient Account Number: [id number] Date of Birth/Sex: Treating RN: 12/26/1940 (79 y.o. Debby Bud Primary Care Provider: Wenda Low Other Clinician: Referring Provider: Treating Provider/Extender: Bryon Lions in Treatment: 0 Active Inactive Nutrition Nursing Diagnoses: Impaired glucose control: actual  or potential Goals: Patient/caregiver agrees to and verbalizes understanding of need to obtain nutritional consultation Date Initiated: 09/27/2019 Target Resolution Date: 10/05/2019 Goal Status: Active Interventions: Provide education on elevated blood sugars and impact on wound healing Provide education on nutrition Treatment Activities: Patient referred to Primary Care Physician for further nutritional evaluation : 09/27/2019 Notes: Orientation to the Wound Care Program Nursing Diagnoses: Knowledge deficit related to the wound healing center  program Goals: Patient/caregiver will verbalize understanding of the [facility] Date Initiated: 09/27/2019 Target Resolution Date: 10/05/2019 Goal Status: Active Interventions: Provide education on orientation to the wound center Notes: Pain, Acute or Chronic Nursing Diagnoses: Pain, acute or chronic: actual or potential Potential alteration in comfort, pain Goals: Patient will verbalize adequate pain control and receive pain control interventions during procedures as needed Date Initiated: 09/27/2019 Target Resolution Date: 10/05/2019 Goal Status: Active Patient/caregiver will verbalize comfort level met Date Initiated: 09/27/2019 Target Resolution Date: 10/05/2019 Goal Status: Active Interventions: Encourage patient to take pain medications as prescribed Provide education on pain management Reposition patient for comfort Treatment Activities: Administer pain control measures as ordered : 09/27/2019 Notes: Wound/Skin Impairment Nursing Diagnoses: Knowledge deficit related to ulceration/compromised skin integrity Goals: Patient/caregiver will verbalize understanding of skin care regimen Date Initiated: 09/27/2019 Target Resolution Date: 10/05/2019 Goal Status: Active Interventions: Assess patient/caregiver ability to obtain necessary supplies Assess patient/caregiver ability to perform ulcer/skin care regimen upon admission and as needed Provide education on ulcer and skin care Treatment Activities: Skin care regimen initiated : 09/27/2019 Topical wound management initiated : 09/27/2019 Notes: Electronic Signature(s) Signed: 09/27/2019 5:33:37 PM By: Deon Pilling Signed: 10/09/2019 2:49:20 PM By: Deon Pilling Signed: 10/18/2019 11:23:30 AM By: Deon Pilling Signed: 10/22/2019 1:30:32 PM By: Deon Pilling Entered By: Deon Pilling on 09/27/2019 14:33:51 -------------------------------------------------------------------------------- Pain Assessment Details Patient  Name: Date of Service: Karrie [place], [place]. 09/27/2019 1:15 PM Medical Record Number: [id number] Patient Account Number: [id number] Date of Birth/Sex: Treating RN: Apr 16, 1941 (79 y.o. Clearnce Sorrel Primary Care Provider: Wenda Low Other Clinician: Deon Pilling Referring Provider: Treating Provider/Extender: Bryon Lions in Treatment: 0 Active Problems Location of Pain Severity and Description of Pain Patient Has Paino No Site Locations Pain Management and Medication Current Pain Management: Electronic Signature(s) Signed: 10/11/2019 7:08:41 PM By: Deon Pilling Signed: 10/18/2019 11:23:30 AM By: Deon Pilling Signed: 10/22/2019 1:52:43 PM By: Kela Millin Previous Signature: 09/27/2019 5:19:50 PM Version By: Kela Millin Entered By: Deon Pilling on 10/11/2019 19:08:41 -------------------------------------------------------------------------------- Patient/Caregiver Education Details Patient Name: Date of Service: Karrie Doff[place], [place] 4/29/2021andnbsp1:15 PM Medical Record Number: [id number] Patient Account Number: [id number] Date of Birth/Gender: Treating RN: 11-05-1940 (79 y.o. Debby Bud Primary Care Physician: Wenda Low Other Clinician: Referring Physician: Treating Physician/Extender: Bryon Lions in Treatment: 0 Education Assessment Education Provided To: Patient Education Topics Provided Welcome T [facility]: o Handouts: Welcome T [facility] o Methods: Explain/Verbal, Printed Responses: Reinforcements needed Wound/Skin Impairment: Handouts: Caring for Your Ulcer, Skin Care Do's and Dont's Methods: Explain/Verbal, Printed Responses: Reinforcements needed Electronic Signature(s) Signed: 09/27/2019 5:33:37 PM By: Deon Pilling Signed: 10/09/2019 2:49:20 PM By: Deon Pilling Signed: 10/18/2019 11:23:30 AM By: Deon Pilling Entered By: Deon Pilling on 09/27/2019  14:34:09 -------------------------------------------------------------------------------- Wound Assessment Details Patient Name: Date of Service: Karrie [place], [place]. 09/27/2019 1:15 PM Medical Record Number: [id number] Patient Account Number: [id number] Date of Birth/Sex: Treating RN: September 01, 1940 (79 y.o. 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 Wounding Event: Blister Wound Status: Open Date Acquired: 07/25/2019 Comorbid History: Hypertension, Type II Diabetes Weeks Of Treatment: 0 Clustered Wound: No Photos Wound Measurements Length: (cm) 0.5 Width: (cm) 0.6 Depth: (cm) 0.1 Area: (cm) 0.236 Volume: (cm) 0.024 % Reduction in Area: 0% % Reduction in Volume: 0% Epithelialization: None Tunneling: No Undermining: No Wound Description Classification: Grade 2 Wound Margin: Distinct, outline attached Exudate Amount: Small Exudate Type: Serosanguineous Exudate Color: red, brown Foul Odor After Cleansing: No Slough/Fibrino No Wound Bed Granulation Amount: Large (67-100%) Exposed Structure Granulation Quality: Red, Pink Fascia Exposed: No Necrotic Amount: None Present (0%) Fat Layer (Subcutaneous Tissue) Exposed: Yes Tendon Exposed: No Muscle Exposed: No Joint Exposed: No Bone Exposed: No Electronic Signature(s) Signed: 10/11/2019 7:09:10 PM By: Deon Pilling Signed: 10/18/2019 11:23:30 AM By: Deon Pilling Signed: 10/22/2019 1:52:43 PM By: Kela Millin Previous Signature: 09/27/2019 5:19:50 PM Version By: Kela Millin Previous Signature: 09/27/2019 5:33:37 PM Version By: Deon Pilling Entered By: Deon Pilling on 10/11/2019  19:09:10 -------------------------------------------------------------------------------- Vitals Details Patient Name: Date of Service: Karrie [place], [place]. 09/27/2019 1:15 PM Medical Record Number: [id number] Patient Account Number: [id number] Date of Birth/Sex: Treating RN: 1940/08/29 (79 y.o. Clearnce Sorrel Primary Care Provider: Wenda Low Other Clinician: Deon Pilling Referring Provider: Treating Provider/Extender: Bryon Lions in Treatment: 0 Vital Signs Time Taken: 14:00 Temperature (F): 98 Pulse (bpm): 57 Respiratory Rate (breaths/min): 18 Blood Pressure (mmHg): 165/48 Reference Range: 80 - 120 mg / dl Notes caregiver does not know what her last CBG was Electronic Signature(s) Signed: 10/11/2019 7:00:52 PM By: Deon Pilling Signed: 10/18/2019 11:23:30 AM By: Deon Pilling Previous Signature: 09/27/2019 5:19:50 PM Version By: Kela Millin Entered By: Deon Pilling on 10/11/2019 19:00:52

## 2019-10-24 DIAGNOSIS — I69351 Hemiplegia and hemiparesis following cerebral infarction affecting right dominant side: Secondary | ICD-10-CM | POA: Diagnosis not present

## 2019-10-24 DIAGNOSIS — E11319 Type 2 diabetes mellitus with unspecified diabetic retinopathy without macular edema: Secondary | ICD-10-CM | POA: Diagnosis not present

## 2019-10-24 DIAGNOSIS — Z9181 History of falling: Secondary | ICD-10-CM | POA: Diagnosis not present

## 2019-10-24 DIAGNOSIS — R569 Unspecified convulsions: Secondary | ICD-10-CM | POA: Diagnosis not present

## 2019-10-24 DIAGNOSIS — L89622 Pressure ulcer of left heel, stage 2: Secondary | ICD-10-CM | POA: Diagnosis not present

## 2019-10-24 DIAGNOSIS — I10 Essential (primary) hypertension: Secondary | ICD-10-CM | POA: Diagnosis not present

## 2019-10-24 DIAGNOSIS — E785 Hyperlipidemia, unspecified: Secondary | ICD-10-CM | POA: Diagnosis not present

## 2019-10-24 DIAGNOSIS — I69328 Other speech and language deficits following cerebral infarction: Secondary | ICD-10-CM | POA: Diagnosis not present

## 2019-10-24 DIAGNOSIS — M5030 Other cervical disc degeneration, unspecified cervical region: Secondary | ICD-10-CM | POA: Diagnosis not present

## 2019-10-24 NOTE — Progress Notes (Addendum)
[name], [name] ([id number]) Visit Report for 09/27/2019 Chief Complaint Document Details Patient Name: Date of Service: [place], [name] 09/27/2019 1:15 PM Medical Record Number: [id number] Patient Account Number: [id number] Date of Birth/Sex: Treating RN: July 03, 1940 (79 y.o. Arta Silence Primary Care Provider: Georgann Housekeeper Other Clinician: Referring Provider: Treating Provider/Extender: Georgana Curio, Derald Macleod in Treatment: 0 Information Obtained from: Patient Chief Complaint 09/27/19; Patient who is here for review of wound on left heel Electronic Signature(s) Signed: 09/27/2019 5:41:26 PM By: Baltazar Najjar MD Entered By: Baltazar Najjar on 09/27/2019 15:25:13 -------------------------------------------------------------------------------- Debridement Details Patient Name: Date of Service: [name], [name] Divine. 09/27/2019 1:15 PM Medical Record Number: [id number] Patient Account Number: [id number] Date of Birth/Sex: Treating RN: 1940-12-19 (79 y.o. Arta Silence Primary Care Provider: Georgann Housekeeper Other Clinician: Referring Provider: Treating Provider/Extender: Sherryll Burger in Treatment: 0 Debridement Performed for Assessment: Wound #1 Left Calcaneus Performed By: Physician Maxwell Caul., MD Debridement Type: Debridement Severity of Tissue Pre Debridement: Fat layer exposed Level of Consciousness (Pre-procedure): Awake and Alert Pre-procedure Verification/Time Out Yes - 15:05 Taken: Start Time: 15:06 Pain Control: Lidocaine 4% T opical Solution T Area Debrided (L x W): otal 0.5 (cm) x 0.6 (cm) = 0.3 (cm) Tissue and other material debrided: Viable, Subcutaneous, Skin: Dermis Level: Skin/Subcutaneous Tissue Debridement Description: Excisional Instrument: Blade Bleeding: Moderate Hemostasis Achieved: Silver Nitrate End Time: 15:10 Procedural Pain: 0 Post Procedural Pain: 3 Response to Treatment: Procedure was tolerated  well Level of Consciousness (Post- Awake and Alert procedure): Post Debridement Measurements of Total Wound Length: (cm) 0.5 Width: (cm) 0.6 Depth: (cm) 0.1 Volume: (cm) 0.024 Character of Wound/Ulcer Post Debridement: Improved Severity of Tissue Post Debridement: Fat layer exposed Post Procedure Diagnosis Same as Pre-procedure Electronic Signature(s) Signed: 09/27/2019 5:41:26 PM By: Baltazar Najjar MD Signed: 10/09/2019 3:20:42 PM By: Shawn Stall Signed: 10/18/2019 11:23:30 AM By: Shawn Stall Signed: 10/22/2019 1:30:32 PM By: Shawn Stall Entered By: Baltazar Najjar on 09/27/2019 15:23:20 -------------------------------------------------------------------------------- HPI Details Patient Name: Date of Service: [name], [name] C. 09/27/2019 1:15 PM Medical Record Number: [id number] Patient Account Number: [id number] Date of Birth/Sex: Treating RN: [birth year] (79 y.o. Arta Silence Primary Care Provider: Georgann Housekeeper Other Clinician: Referring Provider: Treating Provider/Extender: Georgana Curio, Derald Macleod in Treatment: 0 History of Present Illness HPI Description: ADMISSION 09/27/2019; this is a 79 year old woman who is a type II diabetic but with a well-controlled recent hemoglobin A1c of 5.5. Roughly 2 months ago she developed a blister on her left heel. She was in the ER on 07/27/2019 and there is a picture of this at which time a sizable wound. This is mostly healed but a small open area remains. They are here for our review of this. Past medical history includes hypertension, type 2 diabetes, stage II chronic kidney disease, hypercholesterolemia and a remote CVA. The patient is dilatory ABI is 0.98 on the left Electronic Signature(s) Signed: 09/27/2019 5:41:26 PM By: Baltazar Najjar MD Entered By: Baltazar Najjar on 09/27/2019 15:30:43 -------------------------------------------------------------------------------- Physical Exam Details Patient Name: Date  of Service: [name], [name] Rector C. 09/27/2019 1:15 PM Medical Record Number: [id number] Patient Account Number: [id number] Date of Birth/Sex: Treating RN: Oct 27, 1940 (79 y.o. Arta Silence Primary Care Provider: Georgann Housekeeper Other Clinician: Referring Provider: Treating Provider/Extender: Sherryll Burger in Treatment: 0 Constitutional Patient is hypertensive.. Pulse regular and within target range for patient.Marland Kitchen Respirations regular, non-labored and within target range.. Temperature is normal and within the  target range for the patient.Marland Kitchen Appears in no distress. Cardiovascular Pedal pulses are easily palpable on the left. Extremities are free of varicosities, clubbing or edema. Peripheral pulses strong and equal. Capillary refill < 3 seconds.. Integumentary (Hair, Skin) There is no erythema around the wound. Notes Wound exam; the area in question is on the tip of the left heel. A lot of this is closed down however there is a single Michaelfurt of the very hyper granulated tissue that remained. I used a #15 scalpel to remove the hyper granulated area and then silver nitrate to cauterize and put the wound area down to the level of her surrounding skin. Electronic Signature(s) Signed: 09/27/2019 5:41:26 PM By: Baltazar Najjar MD Entered By: Baltazar Najjar on 09/27/2019 15:32:15 -------------------------------------------------------------------------------- Physician Orders Details Patient Name: Date of Service: [name], [place]. 09/27/2019 1:15 PM Medical Record Number: [id number] Patient Account Number: [id number] Date of Birth/Sex: Treating RN: Jan 20, 1941 (79 y.o. Arta Silence Primary Care Provider: Georgann Housekeeper Other Clinician: Referring Provider: Treating Provider/Extender: Sherryll Burger in Treatment: 0 Verbal / Phone Orders: No Diagnosis Coding Follow-up Appointments Return Appointment in 1 week. Dressing Change Frequency Change  dressing three times week. - twice my home health. once by wound center. Wound Cleansing May shower and wash wound with soap and water. - with dressing changes only. Primary Wound Dressing Wound #1 Left Calcaneus Calcium Alginate with Silver Secondary Dressing Wound #1 Left Calcaneus Kerlix/Rolled Gauze Dry Gauze Heel Cup Edema Control Elevate legs to the level of the heart or above for 30 minutes daily and/or when sitting, a frequency of: - throughout the day. Off-Loading Other: - float heels while resting in bed or chair. patient to use offloading heel crates as well. Home Health Continue Home Health skilled nursing for wound care. - [facility] to change twice a week. Patient Medications llergies: No Known Allergies A Notifications Medication Indication Start End lidocaine DOSE topical 4 % gel - gel topical applied only in clinic. Electronic Signature(s) Signed: 09/27/2019 5:33:37 PM By: Shawn Stall Signed: 09/27/2019 5:41:26 PM By: Baltazar Najjar MD Signed: 10/09/2019 2:49:20 PM By: Shawn Stall Signed: 10/18/2019 11:23:30 AM By: Shawn Stall Entered By: Shawn Stall on 09/27/2019 15:13:49 Prescription 09/27/2019 -------------------------------------------------------------------------------- Tonna Corner MD Patient Name: Provider: 1941/03/24 [id number] Date of Birth: NPI#Carmon Ginsberg [id number] Sex: DEA #: [phone] [id number] Phone #: License #: Eligha Bridegroom [facility] Patient Address: [address] [place], [place] [address] [address] [place], [place] [address] [phone] Allergies Name Reaction Severity No Known Allergies Medication Medication: Route: Strength: Form: lidocaine topical 4% gel Class: TOPICAL LOCAL ANESTHETICS Dose: Frequency / Time: Indication: gel topical applied only in clinic. Number of Refills: Number of Units: 0 Generic Substitution: Start Date: End Date:  Administered at Facility: Substitution Permitted Yes Time Administered: Time Discontinued: Note to Pharmacy: Hand Signature: Date(s): Electronic Signature(s) Signed: 09/27/2019 5:33:37 PM By: Shawn Stall Signed: 09/27/2019 5:41:26 PM By: Baltazar Najjar MD Entered By: Shawn Stall on 09/27/2019 15:13:49 -------------------------------------------------------------------------------- Problem List Details Patient Name: Date of Service: [name], [name] Divine. 09/27/2019 1:15 PM Medical Record Number: [id number] Patient Account Number: [id number] Date of Birth/Sex: Treating RN: 09/09/1940 (79 y.o. Arta Silence Primary Care Provider: Georgann Housekeeper Other Clinician: Referring Provider: Treating Provider/Extender: Sherryll Burger in Treatment: 0 Active Problems ICD-10 Encounter Code Description Active Date MDM Diagnosis E11.621 Type 2 diabetes mellitus with foot ulcer 09/27/2019 No Yes L97.428  Non-pressure chronic ulcer of left heel and midfoot with other specified 09/27/2019 No Yes severity Inactive Problems Resolved Problems Electronic Signature(s) Signed: 09/27/2019 5:41:26 PM By: Baltazar Najjar MD Entered By: Baltazar Najjar on 09/27/2019 15:22:48 -------------------------------------------------------------------------------- Progress Note Details Patient Name: Date of Service: [name], [name] Divine. 09/27/2019 1:15 PM Medical Record Number: [id number] Patient Account Number: [id number] Date of Birth/Sex: Treating RN: [birth year] (79 y.o. Arta Silence Primary Care Provider: Georgann Housekeeper Other Clinician: Referring Provider: Treating Provider/Extender: Georgana Curio, Derald Macleod in Treatment: 0 Subjective Chief Complaint Information obtained from Patient 09/27/19; Patient who is here for review of wound on left heel History of Present Illness (HPI) ADMISSION 09/27/2019; this is a 79 year old woman who is a type II diabetic but with a  well-controlled recent hemoglobin A1c of 5.5. Roughly 2 months ago she developed a blister on her left heel. She was in the ER on 07/27/2019 and there is a picture of this at which time a sizable wound. This is mostly healed but a small open area remains. They are here for our review of this. Past medical history includes hypertension, type 2 diabetes, stage II chronic kidney disease, hypercholesterolemia and a remote CVA. The patient is dilatory ABI is 0.98 on the left Patient History Unable to Obtain Patient History due to Altered Mental Status. Allergies No Known Allergies Family History Heart Disease - Mother, Hypertension - Mother, No family history of Cancer, Diabetes, Hereditary Spherocytosis, Kidney Disease, Lung Disease, Seizures, Stroke, Thyroid Problems, Tuberculosis. Social History Never smoker, Marital Status - Widowed, Alcohol Use - Never, Drug Use - No History, Caffeine Use - Never. Medical History Eyes Denies history of Cataracts, Glaucoma, Optic Neuritis Ear/Nose/Mouth/Throat Denies history of Chronic sinus problems/congestion, Middle ear problems Hematologic/Lymphatic Denies history of Anemia, Hemophilia, Human Immunodeficiency Virus, Lymphedema, Sickle Cell Disease Respiratory Denies history of Aspiration, Asthma, Chronic Obstructive Pulmonary Disease (COPD), Pneumothorax, Sleep Apnea Cardiovascular Patient has history of Hypertension Denies history of Angina, Arrhythmia, Congestive Heart Failure, Coronary Artery Disease, Deep Vein Thrombosis, Hypotension, Myocardial Infarction, Peripheral Arterial Disease, Peripheral Venous Disease, Phlebitis, Vasculitis Gastrointestinal Denies history of Cirrhosis , Colitis, Crohnoos, Hepatitis A, Hepatitis B, Hepatitis C Endocrine Patient has history of Type II Diabetes Denies history of Type I Diabetes Genitourinary Denies history of End Stage Renal Disease Immunological Denies history of Lupus Erythematosus, Raynaudoos,  Scleroderma Integumentary (Skin) Denies history of History of Burn Musculoskeletal Denies history of Gout, Rheumatoid Arthritis, Osteoarthritis, Osteomyelitis Neurologic Denies history of Dementia, Neuropathy, Quadriplegia, Paraplegia, Seizure Disorder Oncologic Denies history of Received Chemotherapy, Received Radiation Psychiatric Denies history of Anorexia/bulimia, Confinement Anxiety Patient is treated with Oral Agents. Blood sugar is tested. Medical A Surgical History Notes nd Cardiovascular stroke, CVA, hyperlipidemia Genitourinary history of UTI Musculoskeletal rhabdomyolysis Review of Systems (ROS) Constitutional Symptoms (General Health) Denies complaints or symptoms of Fatigue, Fever, Chills, Marked Weight Change. Eyes Denies complaints or symptoms of Dry Eyes, Vision Changes, Glasses / Contacts. Ear/Nose/Mouth/Throat Denies complaints or symptoms of Chronic sinus problems or rhinitis. Respiratory Denies complaints or symptoms of Chronic or frequent coughs, Shortness of Breath. Cardiovascular Denies complaints or symptoms of Chest pain. Gastrointestinal Denies complaints or symptoms of Frequent diarrhea, Nausea, Vomiting. Endocrine Denies complaints or symptoms of Heat/cold intolerance. Genitourinary Denies complaints or symptoms of Frequent urination. Integumentary (Skin) Complains or has symptoms of Wounds, pressure injury to left heel Musculoskeletal Denies complaints or symptoms of Muscle Pain, Muscle Weakness. Neurologic Denies complaints or symptoms of Numbness/parasthesias. Psychiatric Denies complaints or symptoms of Claustrophobia, Suicidal. Objective Constitutional  Patient is hypertensive.. Pulse regular and within target range for patient.Marland Kitchen Respirations regular, non-labored and within target range.. Temperature is normal and within the target range for the patient.Marland Kitchen Appears in no distress. Vitals Time Taken: 2:00 PM, Temperature: 98 F, Pulse: 57  bpm, Respiratory Rate: 18 breaths/min, Blood Pressure: 165/48 mmHg. General Notes: caregiver does not know what her last CBG was Cardiovascular Pedal pulses are easily palpable on the left. Extremities are free of varicosities, clubbing or edema. Peripheral pulses strong and equal. Capillary refill < 3 seconds.. General Notes: Wound exam; the area in question is on the tip of the left heel. A lot of this is closed down however there is a single Michaelfurt of the very hyper granulated tissue that remained. I used a #15 scalpel to remove the hyper granulated area and then silver nitrate to cauterize and put the wound area down to the level of her surrounding skin. Integumentary (Hair, Skin) There is no erythema around the wound. Wound #1 status is Open. Original cause of wound was Blister. The wound is located on the Left Calcaneus. The wound measures 0.5cm length x 0.6cm width x 0.1cm depth; 0.236cm^2 area and 0.024cm^3 volume. There is Fat Layer (Subcutaneous Tissue) Exposed exposed. There is no tunneling or undermining noted. There is a small amount of serosanguineous drainage noted. The wound margin is distinct with the outline attached to the wound base. There is large (67- 100%) red, pink granulation within the wound bed. There is no necrotic tissue within the wound bed. 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 There was a Excisional Skin/Subcutaneous Tissue Debridement with a total area of 0.3 sq cm performed by Maxwell Caul., MD. With the following instrument(s): Blade to remove Viable tissue/material. Material removed includes Subcutaneous Tissue and Skin: Dermis and after  achieving pain control using Lidocaine 4% T opical Solution. A time out was conducted at 15:05, prior to the start of the procedure. A Moderate amount of bleeding was controlled with Silver Nitrate. The procedure was tolerated well with a pain level of 0 throughout and a pain level of 3 following the procedure. Post Debridement Measurements: 0.5cm length x 0.6cm width x 0.1cm depth; 0.024cm^3 volume. Post debridement Stage noted as Category/Stage II. Character of Wound/Ulcer Post Debridement is improved. Severity of Tissue Post Debridement is: Fat layer exposed. Post procedure Diagnosis Wound #1: Same as Pre-Procedure Plan Follow-up Appointments: Return Appointment in 1 week. Dressing Change Frequency: Change dressing three times week. - twice my home health. once by wound center. Wound Cleansing: May shower and wash wound with soap and water. - with dressing changes only. Primary Wound Dressing: Wound #1 Left Calcaneus: Calcium Alginate with Silver Secondary Dressing: Wound #1 Left Calcaneus: Kerlix/Rolled Gauze Dry Gauze Heel Cup Edema Control: Elevate legs to the level of the heart or above for 30 minutes daily and/or when sitting, a frequency of: - throughout the day. Off-Loading: Other: - float heels while resting in bed or chair. patient to use offloading heel crates as well. Home Health: Continue Home Health skilled nursing for wound care. - [facility] to change twice a week. The following medication(s) was prescribed: lidocaine topical 4 % gel gel topical applied only in clinic. was prescribed at  facility 1. We put silver alginate heel cup and dry gauze on the heel wound. I had some thoughts about Hydrofera Blue because of the hyper granulation. We have [facility] home health to change the dressing 2. We will look at this in 1 week I spent 25 minutes in review of this patient's records face-to-face evaluation and preparation of this record Electronic  Signature(s) Signed: 09/27/2019 5:41:26 PM By: Linton Ham MD Entered By: Linton Ham on 09/27/2019 15:35:00 -------------------------------------------------------------------------------- HxROS Details Patient Name: Date of Service: [name], [name] [place] C. 09/27/2019 1:15 PM Medical Record Number: [id number] Patient Account Number: [id number] Date of Birth/Sex: Treating RN: 05-10-1941 (79 y.o. Clearnce Sorrel Primary Care Provider: Wenda Low Other Clinician: Deon Pilling Referring Provider: Treating Provider/Extender: Bryon Lions in Treatment: 0 Unable to Obtain Patient History due to Altered Mental Status Constitutional Symptoms (General Health) Complaints and Symptoms: Negative for: Fatigue; Fever; Chills; Marked Weight Change Eyes Complaints and Symptoms: Negative for: Dry Eyes; Vision Changes; Glasses / Contacts Medical History: Negative for: Cataracts; Glaucoma; Optic Neuritis Ear/Nose/Mouth/Throat Complaints and Symptoms: Negative for: Chronic sinus problems or rhinitis Medical History: Negative for: Chronic sinus problems/congestion; Middle ear problems Respiratory Complaints and Symptoms: Negative for: Chronic or frequent coughs; Shortness of Breath Medical History: Negative for: Aspiration; Asthma; Chronic Obstructive Pulmonary Disease (COPD); Pneumothorax; Sleep Apnea Cardiovascular Complaints and Symptoms: Negative for: Chest pain Medical History: Positive for: Hypertension Negative for: Angina; Arrhythmia; Congestive Heart Failure; Coronary Artery Disease; Deep Vein Thrombosis; Hypotension; Myocardial Infarction; Peripheral Arterial Disease; Peripheral Venous Disease; Phlebitis; Vasculitis Past Medical History Notes: stroke, CVA, hyperlipidemia Gastrointestinal Complaints and Symptoms: Negative for: Frequent diarrhea; Nausea; Vomiting Medical History: Negative for: Cirrhosis ; Colitis; Crohns; Hepatitis A; Hepatitis B;  Hepatitis C Endocrine Complaints and Symptoms: Negative for: Heat/cold intolerance Medical History: Positive for: Type II Diabetes Negative for: Type I Diabetes Treated with: Oral agents Blood sugar tested every day: Yes Tested : Genitourinary Complaints and Symptoms: Negative for: Frequent urination Medical History: Negative for: End Stage Renal Disease Past Medical History Notes: history of UTI Integumentary (Skin) Complaints and Symptoms: Positive for: Wounds Review of System Notes: pressure injury to left heel Medical History: Negative for: History of Burn Musculoskeletal Complaints and Symptoms: Negative for: Muscle Pain; Muscle Weakness Medical History: Negative for: Gout; Rheumatoid Arthritis; Osteoarthritis; Osteomyelitis Past Medical History Notes: rhabdomyolysis Neurologic Complaints and Symptoms: Negative for: Numbness/parasthesias Medical History: Negative for: Dementia; Neuropathy; Quadriplegia; Paraplegia; Seizure Disorder Psychiatric Complaints and Symptoms: Negative for: Claustrophobia; Suicidal Medical History: Negative for: Anorexia/bulimia; Confinement Anxiety Hematologic/Lymphatic Medical History: Negative for: Anemia; Hemophilia; Human Immunodeficiency Virus; Lymphedema; Sickle Cell Disease Immunological Medical History: Negative for: Lupus Erythematosus; Raynauds; Scleroderma Oncologic Medical History: Negative for: Received Chemotherapy; Received Radiation Immunizations Pneumococcal Vaccine: Received Pneumococcal Vaccination: Yes Implantable Devices None Family and Social History Cancer: No; Diabetes: No; Heart Disease: Yes - Mother; Hereditary Spherocytosis: No; Hypertension: Yes - Mother; Kidney Disease: No; Lung Disease: No; Seizures: No; Stroke: No; Thyroid Problems: No; Tuberculosis: No; Never smoker; Marital Status - Widowed; Alcohol Use: Never; Drug Use: No History; Caffeine Use: Never; Financial Concerns: No; Food, Clothing or  Shelter Needs: No; Support System Lacking: No; Transportation Concerns: No Electronic Signature(s) Signed: 10/11/2019 7:03:44 PM By: Deon Pilling Signed: 10/18/2019 11:23:30 AM By: Deon Pilling Signed: 10/22/2019 1:52:43 PM By: Kela Millin Signed: 10/24/2019 7:27:06 AM By: Linton Ham MD Previous Signature: 09/27/2019 5:19:50 PM Version By: Kela Millin Previous Signature: 09/27/2019 5:41:26 PM Version By: Linton Ham MD Entered By: Deon Pilling on  10/11/2019 19:03:43 -------------------------------------------------------------------------------- SuperBill Details Patient Name: Date of Service: [name], [name] 09/27/2019 Medical Record Number: [id number] Patient Account Number: [id number] Date of Birth/Sex: Treating RN: 21-Oct-1940 (79 y.o. 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 Facility Procedures The patient participates with Medicare or their insurance follows the Medicare Facility Guidelines: CPT4 Code Description Modifier Quantity [phone] - WOUND CARE VISIT-LEV 4 EST PT 1 The patient participates with Medicare or their insurance follows the Medicare Facility Guidelines: [id number] - DEB SUBQ TISSUE 20 SQ CM/< 1 ICD-10 Diagnosis Description L97.428 Non-pressure chronic ulcer of left heel and midfoot with other  specified severity Physician Procedures : CPT4 Code Description Modifier [id number] - WC PHYS LEVEL 2 - NEW PT 25 ICD-10 Diagnosis Description E11.621 Type 2 diabetes mellitus with foot ulcer L97.428 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

## 2019-10-25 DIAGNOSIS — I69328 Other speech and language deficits following cerebral infarction: Secondary | ICD-10-CM | POA: Diagnosis not present

## 2019-10-25 DIAGNOSIS — M5030 Other cervical disc degeneration, unspecified cervical region: Secondary | ICD-10-CM | POA: Diagnosis not present

## 2019-10-25 DIAGNOSIS — E785 Hyperlipidemia, unspecified: Secondary | ICD-10-CM | POA: Diagnosis not present

## 2019-10-25 DIAGNOSIS — R569 Unspecified convulsions: Secondary | ICD-10-CM | POA: Diagnosis not present

## 2019-10-25 DIAGNOSIS — L89622 Pressure ulcer of left heel, stage 2: Secondary | ICD-10-CM | POA: Diagnosis not present

## 2019-10-25 DIAGNOSIS — E11319 Type 2 diabetes mellitus with unspecified diabetic retinopathy without macular edema: Secondary | ICD-10-CM | POA: Diagnosis not present

## 2019-10-25 DIAGNOSIS — I10 Essential (primary) hypertension: Secondary | ICD-10-CM | POA: Diagnosis not present

## 2019-10-25 DIAGNOSIS — I69351 Hemiplegia and hemiparesis following cerebral infarction affecting right dominant side: Secondary | ICD-10-CM | POA: Diagnosis not present

## 2019-10-25 DIAGNOSIS — Z9181 History of falling: Secondary | ICD-10-CM | POA: Diagnosis not present

## 2019-10-31 DIAGNOSIS — M5030 Other cervical disc degeneration, unspecified cervical region: Secondary | ICD-10-CM | POA: Diagnosis not present

## 2019-10-31 DIAGNOSIS — I10 Essential (primary) hypertension: Secondary | ICD-10-CM | POA: Diagnosis not present

## 2019-10-31 DIAGNOSIS — I69328 Other speech and language deficits following cerebral infarction: Secondary | ICD-10-CM | POA: Diagnosis not present

## 2019-10-31 DIAGNOSIS — I69351 Hemiplegia and hemiparesis following cerebral infarction affecting right dominant side: Secondary | ICD-10-CM | POA: Diagnosis not present

## 2019-10-31 DIAGNOSIS — R569 Unspecified convulsions: Secondary | ICD-10-CM | POA: Diagnosis not present

## 2019-10-31 DIAGNOSIS — Z9181 History of falling: Secondary | ICD-10-CM | POA: Diagnosis not present

## 2019-10-31 DIAGNOSIS — L89622 Pressure ulcer of left heel, stage 2: Secondary | ICD-10-CM | POA: Diagnosis not present

## 2019-10-31 DIAGNOSIS — E11319 Type 2 diabetes mellitus with unspecified diabetic retinopathy without macular edema: Secondary | ICD-10-CM | POA: Diagnosis not present

## 2019-10-31 DIAGNOSIS — E785 Hyperlipidemia, unspecified: Secondary | ICD-10-CM | POA: Diagnosis not present

## 2019-11-02 DIAGNOSIS — E11319 Type 2 diabetes mellitus with unspecified diabetic retinopathy without macular edema: Secondary | ICD-10-CM | POA: Diagnosis not present

## 2019-11-02 DIAGNOSIS — L89622 Pressure ulcer of left heel, stage 2: Secondary | ICD-10-CM | POA: Diagnosis not present

## 2019-11-02 DIAGNOSIS — M5030 Other cervical disc degeneration, unspecified cervical region: Secondary | ICD-10-CM | POA: Diagnosis not present

## 2019-11-02 DIAGNOSIS — I69328 Other speech and language deficits following cerebral infarction: Secondary | ICD-10-CM | POA: Diagnosis not present

## 2019-11-02 DIAGNOSIS — I69351 Hemiplegia and hemiparesis following cerebral infarction affecting right dominant side: Secondary | ICD-10-CM | POA: Diagnosis not present

## 2019-11-02 DIAGNOSIS — R569 Unspecified convulsions: Secondary | ICD-10-CM | POA: Diagnosis not present

## 2019-11-02 DIAGNOSIS — Z9181 History of falling: Secondary | ICD-10-CM | POA: Diagnosis not present

## 2019-11-02 DIAGNOSIS — I10 Essential (primary) hypertension: Secondary | ICD-10-CM | POA: Diagnosis not present

## 2019-11-02 DIAGNOSIS — E785 Hyperlipidemia, unspecified: Secondary | ICD-10-CM | POA: Diagnosis not present

## 2019-11-07 DIAGNOSIS — I10 Essential (primary) hypertension: Secondary | ICD-10-CM | POA: Diagnosis not present

## 2019-11-07 DIAGNOSIS — M5030 Other cervical disc degeneration, unspecified cervical region: Secondary | ICD-10-CM | POA: Diagnosis not present

## 2019-11-07 DIAGNOSIS — I69351 Hemiplegia and hemiparesis following cerebral infarction affecting right dominant side: Secondary | ICD-10-CM | POA: Diagnosis not present

## 2019-11-07 DIAGNOSIS — E785 Hyperlipidemia, unspecified: Secondary | ICD-10-CM | POA: Diagnosis not present

## 2019-11-07 DIAGNOSIS — Z9181 History of falling: Secondary | ICD-10-CM | POA: Diagnosis not present

## 2019-11-07 DIAGNOSIS — R569 Unspecified convulsions: Secondary | ICD-10-CM | POA: Diagnosis not present

## 2019-11-07 DIAGNOSIS — I69328 Other speech and language deficits following cerebral infarction: Secondary | ICD-10-CM | POA: Diagnosis not present

## 2019-11-07 DIAGNOSIS — L89622 Pressure ulcer of left heel, stage 2: Secondary | ICD-10-CM | POA: Diagnosis not present

## 2019-11-07 DIAGNOSIS — E11319 Type 2 diabetes mellitus with unspecified diabetic retinopathy without macular edema: Secondary | ICD-10-CM | POA: Diagnosis not present

## 2019-11-09 DIAGNOSIS — E785 Hyperlipidemia, unspecified: Secondary | ICD-10-CM | POA: Diagnosis not present

## 2019-11-09 DIAGNOSIS — I69328 Other speech and language deficits following cerebral infarction: Secondary | ICD-10-CM | POA: Diagnosis not present

## 2019-11-09 DIAGNOSIS — L89622 Pressure ulcer of left heel, stage 2: Secondary | ICD-10-CM | POA: Diagnosis not present

## 2019-11-09 DIAGNOSIS — E11319 Type 2 diabetes mellitus with unspecified diabetic retinopathy without macular edema: Secondary | ICD-10-CM | POA: Diagnosis not present

## 2019-11-09 DIAGNOSIS — Z9181 History of falling: Secondary | ICD-10-CM | POA: Diagnosis not present

## 2019-11-09 DIAGNOSIS — M5030 Other cervical disc degeneration, unspecified cervical region: Secondary | ICD-10-CM | POA: Diagnosis not present

## 2019-11-09 DIAGNOSIS — I69351 Hemiplegia and hemiparesis following cerebral infarction affecting right dominant side: Secondary | ICD-10-CM | POA: Diagnosis not present

## 2019-11-09 DIAGNOSIS — I10 Essential (primary) hypertension: Secondary | ICD-10-CM | POA: Diagnosis not present

## 2019-11-09 DIAGNOSIS — R569 Unspecified convulsions: Secondary | ICD-10-CM | POA: Diagnosis not present

## 2019-11-10 DIAGNOSIS — Z9181 History of falling: Secondary | ICD-10-CM | POA: Diagnosis not present

## 2019-11-10 DIAGNOSIS — I69328 Other speech and language deficits following cerebral infarction: Secondary | ICD-10-CM | POA: Diagnosis not present

## 2019-11-10 DIAGNOSIS — M5030 Other cervical disc degeneration, unspecified cervical region: Secondary | ICD-10-CM | POA: Diagnosis not present

## 2019-11-10 DIAGNOSIS — R569 Unspecified convulsions: Secondary | ICD-10-CM | POA: Diagnosis not present

## 2019-11-10 DIAGNOSIS — I69998 Other sequelae following unspecified cerebrovascular disease: Secondary | ICD-10-CM | POA: Diagnosis not present

## 2019-11-10 DIAGNOSIS — E11319 Type 2 diabetes mellitus with unspecified diabetic retinopathy without macular edema: Secondary | ICD-10-CM | POA: Diagnosis not present

## 2019-11-10 DIAGNOSIS — E785 Hyperlipidemia, unspecified: Secondary | ICD-10-CM | POA: Diagnosis not present

## 2019-11-10 DIAGNOSIS — I10 Essential (primary) hypertension: Secondary | ICD-10-CM | POA: Diagnosis not present

## 2019-11-10 DIAGNOSIS — I69351 Hemiplegia and hemiparesis following cerebral infarction affecting right dominant side: Secondary | ICD-10-CM | POA: Diagnosis not present

## 2019-11-13 ENCOUNTER — Other Ambulatory Visit: Payer: Self-pay

## 2019-11-13 ENCOUNTER — Ambulatory Visit: Payer: Medicare HMO | Admitting: Podiatry

## 2019-11-13 ENCOUNTER — Encounter: Payer: Self-pay | Admitting: Podiatry

## 2019-11-13 DIAGNOSIS — M79674 Pain in right toe(s): Secondary | ICD-10-CM

## 2019-11-13 DIAGNOSIS — B351 Tinea unguium: Secondary | ICD-10-CM

## 2019-11-13 DIAGNOSIS — M79675 Pain in left toe(s): Secondary | ICD-10-CM | POA: Diagnosis not present

## 2019-11-13 DIAGNOSIS — Z9181 History of falling: Secondary | ICD-10-CM | POA: Diagnosis not present

## 2019-11-13 DIAGNOSIS — M5030 Other cervical disc degeneration, unspecified cervical region: Secondary | ICD-10-CM | POA: Diagnosis not present

## 2019-11-13 DIAGNOSIS — I10 Essential (primary) hypertension: Secondary | ICD-10-CM | POA: Diagnosis not present

## 2019-11-13 DIAGNOSIS — E119 Type 2 diabetes mellitus without complications: Secondary | ICD-10-CM

## 2019-11-13 DIAGNOSIS — E785 Hyperlipidemia, unspecified: Secondary | ICD-10-CM | POA: Diagnosis not present

## 2019-11-13 DIAGNOSIS — R569 Unspecified convulsions: Secondary | ICD-10-CM | POA: Diagnosis not present

## 2019-11-13 DIAGNOSIS — I69351 Hemiplegia and hemiparesis following cerebral infarction affecting right dominant side: Secondary | ICD-10-CM | POA: Diagnosis not present

## 2019-11-13 DIAGNOSIS — R6889 Other general symptoms and signs: Secondary | ICD-10-CM | POA: Diagnosis not present

## 2019-11-13 DIAGNOSIS — E11319 Type 2 diabetes mellitus with unspecified diabetic retinopathy without macular edema: Secondary | ICD-10-CM | POA: Diagnosis not present

## 2019-11-13 DIAGNOSIS — I69328 Other speech and language deficits following cerebral infarction: Secondary | ICD-10-CM | POA: Diagnosis not present

## 2019-11-13 DIAGNOSIS — I69998 Other sequelae following unspecified cerebrovascular disease: Secondary | ICD-10-CM | POA: Diagnosis not present

## 2019-11-13 NOTE — Progress Notes (Signed)
This patient returns to my office for at risk foot care.  This patient requires this care by a professional since this patient will be at risk due to having diabetes.  This patient is unable to cut nails herself since the patient cannot reach her nails.These nails are painful walking and wearing shoes.  This patient presents for at risk foot care today.  She presents to the office with female caregiver.  Patient has not been seen in over one year.  General Appearance  Alert, conversant and in no acute stress.  Vascular  Dorsalis pedis and posterior tibial  pulses are palpable  bilaterally.  Capillary return is within normal limits  bilaterally. Temperature is within normal limits  bilaterally.  Neurologic  Senn-Weinstein monofilament wire test within normal limits  bilaterally. Muscle power within normal limits bilaterally.  Nails Thick disfigured discolored nails with subungual debris  from hallux to fifth toes bilaterally. No evidence of bacterial infection or drainage bilaterally.  Orthopedic  No limitations of motion  feet .  No crepitus or effusions noted.  No bony pathology or digital deformities noted.  Skin  normotropic skin with no porokeratosis noted bilaterally.  No signs of infections or ulcers noted.     Onychomycosis  Pain in right toes  Pain in left toes  Consent was obtained for treatment procedures.   Mechanical debridement of nails 1-5  bilaterally performed with a nail nipper.  Filed with dremel without incident.    Return office visit  3 months                   Told patient to return for periodic foot care and evaluation due to potential at risk complications.   Helane Gunther DPM

## 2019-11-14 DIAGNOSIS — M5030 Other cervical disc degeneration, unspecified cervical region: Secondary | ICD-10-CM | POA: Diagnosis not present

## 2019-11-14 DIAGNOSIS — Z9181 History of falling: Secondary | ICD-10-CM | POA: Diagnosis not present

## 2019-11-14 DIAGNOSIS — G4700 Insomnia, unspecified: Secondary | ICD-10-CM | POA: Diagnosis not present

## 2019-11-14 DIAGNOSIS — H409 Unspecified glaucoma: Secondary | ICD-10-CM | POA: Diagnosis not present

## 2019-11-14 DIAGNOSIS — M1990 Unspecified osteoarthritis, unspecified site: Secondary | ICD-10-CM | POA: Diagnosis not present

## 2019-11-14 DIAGNOSIS — R569 Unspecified convulsions: Secondary | ICD-10-CM | POA: Diagnosis not present

## 2019-11-14 DIAGNOSIS — I639 Cerebral infarction, unspecified: Secondary | ICD-10-CM | POA: Diagnosis not present

## 2019-11-14 DIAGNOSIS — E7800 Pure hypercholesterolemia, unspecified: Secondary | ICD-10-CM | POA: Diagnosis not present

## 2019-11-14 DIAGNOSIS — I69351 Hemiplegia and hemiparesis following cerebral infarction affecting right dominant side: Secondary | ICD-10-CM | POA: Diagnosis not present

## 2019-11-14 DIAGNOSIS — E11319 Type 2 diabetes mellitus with unspecified diabetic retinopathy without macular edema: Secondary | ICD-10-CM | POA: Diagnosis not present

## 2019-11-14 DIAGNOSIS — I69328 Other speech and language deficits following cerebral infarction: Secondary | ICD-10-CM | POA: Diagnosis not present

## 2019-11-14 DIAGNOSIS — N182 Chronic kidney disease, stage 2 (mild): Secondary | ICD-10-CM | POA: Diagnosis not present

## 2019-11-14 DIAGNOSIS — I69998 Other sequelae following unspecified cerebrovascular disease: Secondary | ICD-10-CM | POA: Diagnosis not present

## 2019-11-14 DIAGNOSIS — I10 Essential (primary) hypertension: Secondary | ICD-10-CM | POA: Diagnosis not present

## 2019-11-14 DIAGNOSIS — E785 Hyperlipidemia, unspecified: Secondary | ICD-10-CM | POA: Diagnosis not present

## 2019-11-15 DIAGNOSIS — Z9181 History of falling: Secondary | ICD-10-CM | POA: Diagnosis not present

## 2019-11-15 DIAGNOSIS — I10 Essential (primary) hypertension: Secondary | ICD-10-CM | POA: Diagnosis not present

## 2019-11-15 DIAGNOSIS — E11319 Type 2 diabetes mellitus with unspecified diabetic retinopathy without macular edema: Secondary | ICD-10-CM | POA: Diagnosis not present

## 2019-11-15 DIAGNOSIS — I69351 Hemiplegia and hemiparesis following cerebral infarction affecting right dominant side: Secondary | ICD-10-CM | POA: Diagnosis not present

## 2019-11-15 DIAGNOSIS — M5030 Other cervical disc degeneration, unspecified cervical region: Secondary | ICD-10-CM | POA: Diagnosis not present

## 2019-11-15 DIAGNOSIS — I69328 Other speech and language deficits following cerebral infarction: Secondary | ICD-10-CM | POA: Diagnosis not present

## 2019-11-15 DIAGNOSIS — R569 Unspecified convulsions: Secondary | ICD-10-CM | POA: Diagnosis not present

## 2019-11-15 DIAGNOSIS — I69998 Other sequelae following unspecified cerebrovascular disease: Secondary | ICD-10-CM | POA: Diagnosis not present

## 2019-11-15 DIAGNOSIS — E785 Hyperlipidemia, unspecified: Secondary | ICD-10-CM | POA: Diagnosis not present

## 2019-11-16 DIAGNOSIS — M5030 Other cervical disc degeneration, unspecified cervical region: Secondary | ICD-10-CM | POA: Diagnosis not present

## 2019-11-16 DIAGNOSIS — I69351 Hemiplegia and hemiparesis following cerebral infarction affecting right dominant side: Secondary | ICD-10-CM | POA: Diagnosis not present

## 2019-11-16 DIAGNOSIS — I10 Essential (primary) hypertension: Secondary | ICD-10-CM | POA: Diagnosis not present

## 2019-11-16 DIAGNOSIS — I69328 Other speech and language deficits following cerebral infarction: Secondary | ICD-10-CM | POA: Diagnosis not present

## 2019-11-16 DIAGNOSIS — R569 Unspecified convulsions: Secondary | ICD-10-CM | POA: Diagnosis not present

## 2019-11-16 DIAGNOSIS — E11319 Type 2 diabetes mellitus with unspecified diabetic retinopathy without macular edema: Secondary | ICD-10-CM | POA: Diagnosis not present

## 2019-11-16 DIAGNOSIS — E785 Hyperlipidemia, unspecified: Secondary | ICD-10-CM | POA: Diagnosis not present

## 2019-11-16 DIAGNOSIS — I69998 Other sequelae following unspecified cerebrovascular disease: Secondary | ICD-10-CM | POA: Diagnosis not present

## 2019-11-16 DIAGNOSIS — Z9181 History of falling: Secondary | ICD-10-CM | POA: Diagnosis not present

## 2019-11-19 DIAGNOSIS — I69351 Hemiplegia and hemiparesis following cerebral infarction affecting right dominant side: Secondary | ICD-10-CM | POA: Diagnosis not present

## 2019-11-19 DIAGNOSIS — M5030 Other cervical disc degeneration, unspecified cervical region: Secondary | ICD-10-CM | POA: Diagnosis not present

## 2019-11-19 DIAGNOSIS — Z9181 History of falling: Secondary | ICD-10-CM | POA: Diagnosis not present

## 2019-11-19 DIAGNOSIS — E11319 Type 2 diabetes mellitus with unspecified diabetic retinopathy without macular edema: Secondary | ICD-10-CM | POA: Diagnosis not present

## 2019-11-19 DIAGNOSIS — R569 Unspecified convulsions: Secondary | ICD-10-CM | POA: Diagnosis not present

## 2019-11-19 DIAGNOSIS — I10 Essential (primary) hypertension: Secondary | ICD-10-CM | POA: Diagnosis not present

## 2019-11-19 DIAGNOSIS — E785 Hyperlipidemia, unspecified: Secondary | ICD-10-CM | POA: Diagnosis not present

## 2019-11-19 DIAGNOSIS — I69328 Other speech and language deficits following cerebral infarction: Secondary | ICD-10-CM | POA: Diagnosis not present

## 2019-11-19 DIAGNOSIS — I69998 Other sequelae following unspecified cerebrovascular disease: Secondary | ICD-10-CM | POA: Diagnosis not present

## 2019-11-20 DIAGNOSIS — I69998 Other sequelae following unspecified cerebrovascular disease: Secondary | ICD-10-CM | POA: Diagnosis not present

## 2019-11-20 DIAGNOSIS — I69328 Other speech and language deficits following cerebral infarction: Secondary | ICD-10-CM | POA: Diagnosis not present

## 2019-11-20 DIAGNOSIS — E11319 Type 2 diabetes mellitus with unspecified diabetic retinopathy without macular edema: Secondary | ICD-10-CM | POA: Diagnosis not present

## 2019-11-20 DIAGNOSIS — M5030 Other cervical disc degeneration, unspecified cervical region: Secondary | ICD-10-CM | POA: Diagnosis not present

## 2019-11-20 DIAGNOSIS — I69351 Hemiplegia and hemiparesis following cerebral infarction affecting right dominant side: Secondary | ICD-10-CM | POA: Diagnosis not present

## 2019-11-20 DIAGNOSIS — E785 Hyperlipidemia, unspecified: Secondary | ICD-10-CM | POA: Diagnosis not present

## 2019-11-20 DIAGNOSIS — I10 Essential (primary) hypertension: Secondary | ICD-10-CM | POA: Diagnosis not present

## 2019-11-20 DIAGNOSIS — R569 Unspecified convulsions: Secondary | ICD-10-CM | POA: Diagnosis not present

## 2019-11-20 DIAGNOSIS — Z9181 History of falling: Secondary | ICD-10-CM | POA: Diagnosis not present

## 2019-11-21 DIAGNOSIS — R569 Unspecified convulsions: Secondary | ICD-10-CM | POA: Diagnosis not present

## 2019-11-21 DIAGNOSIS — I10 Essential (primary) hypertension: Secondary | ICD-10-CM | POA: Diagnosis not present

## 2019-11-21 DIAGNOSIS — I69328 Other speech and language deficits following cerebral infarction: Secondary | ICD-10-CM | POA: Diagnosis not present

## 2019-11-21 DIAGNOSIS — E11319 Type 2 diabetes mellitus with unspecified diabetic retinopathy without macular edema: Secondary | ICD-10-CM | POA: Diagnosis not present

## 2019-11-21 DIAGNOSIS — M5030 Other cervical disc degeneration, unspecified cervical region: Secondary | ICD-10-CM | POA: Diagnosis not present

## 2019-11-21 DIAGNOSIS — I69998 Other sequelae following unspecified cerebrovascular disease: Secondary | ICD-10-CM | POA: Diagnosis not present

## 2019-11-21 DIAGNOSIS — E785 Hyperlipidemia, unspecified: Secondary | ICD-10-CM | POA: Diagnosis not present

## 2019-11-21 DIAGNOSIS — Z9181 History of falling: Secondary | ICD-10-CM | POA: Diagnosis not present

## 2019-11-21 DIAGNOSIS — I69351 Hemiplegia and hemiparesis following cerebral infarction affecting right dominant side: Secondary | ICD-10-CM | POA: Diagnosis not present

## 2019-11-22 DIAGNOSIS — I69328 Other speech and language deficits following cerebral infarction: Secondary | ICD-10-CM | POA: Diagnosis not present

## 2019-11-22 DIAGNOSIS — E785 Hyperlipidemia, unspecified: Secondary | ICD-10-CM | POA: Diagnosis not present

## 2019-11-22 DIAGNOSIS — I69998 Other sequelae following unspecified cerebrovascular disease: Secondary | ICD-10-CM | POA: Diagnosis not present

## 2019-11-22 DIAGNOSIS — Z9181 History of falling: Secondary | ICD-10-CM | POA: Diagnosis not present

## 2019-11-22 DIAGNOSIS — R569 Unspecified convulsions: Secondary | ICD-10-CM | POA: Diagnosis not present

## 2019-11-22 DIAGNOSIS — I10 Essential (primary) hypertension: Secondary | ICD-10-CM | POA: Diagnosis not present

## 2019-11-22 DIAGNOSIS — E11319 Type 2 diabetes mellitus with unspecified diabetic retinopathy without macular edema: Secondary | ICD-10-CM | POA: Diagnosis not present

## 2019-11-22 DIAGNOSIS — M5030 Other cervical disc degeneration, unspecified cervical region: Secondary | ICD-10-CM | POA: Diagnosis not present

## 2019-11-22 DIAGNOSIS — I69351 Hemiplegia and hemiparesis following cerebral infarction affecting right dominant side: Secondary | ICD-10-CM | POA: Diagnosis not present

## 2019-11-26 DIAGNOSIS — I69351 Hemiplegia and hemiparesis following cerebral infarction affecting right dominant side: Secondary | ICD-10-CM | POA: Diagnosis not present

## 2019-11-26 DIAGNOSIS — R569 Unspecified convulsions: Secondary | ICD-10-CM | POA: Diagnosis not present

## 2019-11-26 DIAGNOSIS — I69328 Other speech and language deficits following cerebral infarction: Secondary | ICD-10-CM | POA: Diagnosis not present

## 2019-11-26 DIAGNOSIS — E785 Hyperlipidemia, unspecified: Secondary | ICD-10-CM | POA: Diagnosis not present

## 2019-11-26 DIAGNOSIS — E11319 Type 2 diabetes mellitus with unspecified diabetic retinopathy without macular edema: Secondary | ICD-10-CM | POA: Diagnosis not present

## 2019-11-26 DIAGNOSIS — M5030 Other cervical disc degeneration, unspecified cervical region: Secondary | ICD-10-CM | POA: Diagnosis not present

## 2019-11-26 DIAGNOSIS — Z9181 History of falling: Secondary | ICD-10-CM | POA: Diagnosis not present

## 2019-11-26 DIAGNOSIS — I69998 Other sequelae following unspecified cerebrovascular disease: Secondary | ICD-10-CM | POA: Diagnosis not present

## 2019-11-26 DIAGNOSIS — I10 Essential (primary) hypertension: Secondary | ICD-10-CM | POA: Diagnosis not present

## 2019-11-27 DIAGNOSIS — R569 Unspecified convulsions: Secondary | ICD-10-CM | POA: Diagnosis not present

## 2019-11-27 DIAGNOSIS — Z9181 History of falling: Secondary | ICD-10-CM | POA: Diagnosis not present

## 2019-11-27 DIAGNOSIS — M5030 Other cervical disc degeneration, unspecified cervical region: Secondary | ICD-10-CM | POA: Diagnosis not present

## 2019-11-27 DIAGNOSIS — I69351 Hemiplegia and hemiparesis following cerebral infarction affecting right dominant side: Secondary | ICD-10-CM | POA: Diagnosis not present

## 2019-11-27 DIAGNOSIS — I69998 Other sequelae following unspecified cerebrovascular disease: Secondary | ICD-10-CM | POA: Diagnosis not present

## 2019-11-27 DIAGNOSIS — E11319 Type 2 diabetes mellitus with unspecified diabetic retinopathy without macular edema: Secondary | ICD-10-CM | POA: Diagnosis not present

## 2019-11-27 DIAGNOSIS — E785 Hyperlipidemia, unspecified: Secondary | ICD-10-CM | POA: Diagnosis not present

## 2019-11-27 DIAGNOSIS — I10 Essential (primary) hypertension: Secondary | ICD-10-CM | POA: Diagnosis not present

## 2019-11-27 DIAGNOSIS — I69328 Other speech and language deficits following cerebral infarction: Secondary | ICD-10-CM | POA: Diagnosis not present

## 2019-11-29 DIAGNOSIS — E11319 Type 2 diabetes mellitus with unspecified diabetic retinopathy without macular edema: Secondary | ICD-10-CM | POA: Diagnosis not present

## 2019-11-29 DIAGNOSIS — Z9181 History of falling: Secondary | ICD-10-CM | POA: Diagnosis not present

## 2019-11-29 DIAGNOSIS — R569 Unspecified convulsions: Secondary | ICD-10-CM | POA: Diagnosis not present

## 2019-11-29 DIAGNOSIS — I10 Essential (primary) hypertension: Secondary | ICD-10-CM | POA: Diagnosis not present

## 2019-11-29 DIAGNOSIS — I69351 Hemiplegia and hemiparesis following cerebral infarction affecting right dominant side: Secondary | ICD-10-CM | POA: Diagnosis not present

## 2019-11-29 DIAGNOSIS — I69328 Other speech and language deficits following cerebral infarction: Secondary | ICD-10-CM | POA: Diagnosis not present

## 2019-11-29 DIAGNOSIS — E785 Hyperlipidemia, unspecified: Secondary | ICD-10-CM | POA: Diagnosis not present

## 2019-11-29 DIAGNOSIS — I69998 Other sequelae following unspecified cerebrovascular disease: Secondary | ICD-10-CM | POA: Diagnosis not present

## 2019-11-29 DIAGNOSIS — M5030 Other cervical disc degeneration, unspecified cervical region: Secondary | ICD-10-CM | POA: Diagnosis not present

## 2019-12-04 DIAGNOSIS — I69328 Other speech and language deficits following cerebral infarction: Secondary | ICD-10-CM | POA: Diagnosis not present

## 2019-12-04 DIAGNOSIS — R569 Unspecified convulsions: Secondary | ICD-10-CM | POA: Diagnosis not present

## 2019-12-04 DIAGNOSIS — E11319 Type 2 diabetes mellitus with unspecified diabetic retinopathy without macular edema: Secondary | ICD-10-CM | POA: Diagnosis not present

## 2019-12-04 DIAGNOSIS — M5030 Other cervical disc degeneration, unspecified cervical region: Secondary | ICD-10-CM | POA: Diagnosis not present

## 2019-12-04 DIAGNOSIS — I10 Essential (primary) hypertension: Secondary | ICD-10-CM | POA: Diagnosis not present

## 2019-12-04 DIAGNOSIS — E785 Hyperlipidemia, unspecified: Secondary | ICD-10-CM | POA: Diagnosis not present

## 2019-12-04 DIAGNOSIS — I69998 Other sequelae following unspecified cerebrovascular disease: Secondary | ICD-10-CM | POA: Diagnosis not present

## 2019-12-04 DIAGNOSIS — Z9181 History of falling: Secondary | ICD-10-CM | POA: Diagnosis not present

## 2019-12-04 DIAGNOSIS — I69351 Hemiplegia and hemiparesis following cerebral infarction affecting right dominant side: Secondary | ICD-10-CM | POA: Diagnosis not present

## 2019-12-05 DIAGNOSIS — I69998 Other sequelae following unspecified cerebrovascular disease: Secondary | ICD-10-CM | POA: Diagnosis not present

## 2019-12-05 DIAGNOSIS — M5030 Other cervical disc degeneration, unspecified cervical region: Secondary | ICD-10-CM | POA: Diagnosis not present

## 2019-12-05 DIAGNOSIS — I10 Essential (primary) hypertension: Secondary | ICD-10-CM | POA: Diagnosis not present

## 2019-12-05 DIAGNOSIS — I69351 Hemiplegia and hemiparesis following cerebral infarction affecting right dominant side: Secondary | ICD-10-CM | POA: Diagnosis not present

## 2019-12-05 DIAGNOSIS — Z9181 History of falling: Secondary | ICD-10-CM | POA: Diagnosis not present

## 2019-12-05 DIAGNOSIS — E785 Hyperlipidemia, unspecified: Secondary | ICD-10-CM | POA: Diagnosis not present

## 2019-12-05 DIAGNOSIS — I69328 Other speech and language deficits following cerebral infarction: Secondary | ICD-10-CM | POA: Diagnosis not present

## 2019-12-05 DIAGNOSIS — R569 Unspecified convulsions: Secondary | ICD-10-CM | POA: Diagnosis not present

## 2019-12-05 DIAGNOSIS — E11319 Type 2 diabetes mellitus with unspecified diabetic retinopathy without macular edema: Secondary | ICD-10-CM | POA: Diagnosis not present

## 2019-12-06 DIAGNOSIS — I69998 Other sequelae following unspecified cerebrovascular disease: Secondary | ICD-10-CM | POA: Diagnosis not present

## 2019-12-06 DIAGNOSIS — I10 Essential (primary) hypertension: Secondary | ICD-10-CM | POA: Diagnosis not present

## 2019-12-06 DIAGNOSIS — R569 Unspecified convulsions: Secondary | ICD-10-CM | POA: Diagnosis not present

## 2019-12-06 DIAGNOSIS — E11319 Type 2 diabetes mellitus with unspecified diabetic retinopathy without macular edema: Secondary | ICD-10-CM | POA: Diagnosis not present

## 2019-12-06 DIAGNOSIS — I69351 Hemiplegia and hemiparesis following cerebral infarction affecting right dominant side: Secondary | ICD-10-CM | POA: Diagnosis not present

## 2019-12-06 DIAGNOSIS — M5030 Other cervical disc degeneration, unspecified cervical region: Secondary | ICD-10-CM | POA: Diagnosis not present

## 2019-12-06 DIAGNOSIS — E785 Hyperlipidemia, unspecified: Secondary | ICD-10-CM | POA: Diagnosis not present

## 2019-12-06 DIAGNOSIS — I69328 Other speech and language deficits following cerebral infarction: Secondary | ICD-10-CM | POA: Diagnosis not present

## 2019-12-06 DIAGNOSIS — Z9181 History of falling: Secondary | ICD-10-CM | POA: Diagnosis not present

## 2019-12-10 DIAGNOSIS — I69998 Other sequelae following unspecified cerebrovascular disease: Secondary | ICD-10-CM | POA: Diagnosis not present

## 2019-12-10 DIAGNOSIS — E785 Hyperlipidemia, unspecified: Secondary | ICD-10-CM | POA: Diagnosis not present

## 2019-12-10 DIAGNOSIS — E11319 Type 2 diabetes mellitus with unspecified diabetic retinopathy without macular edema: Secondary | ICD-10-CM | POA: Diagnosis not present

## 2019-12-10 DIAGNOSIS — M5030 Other cervical disc degeneration, unspecified cervical region: Secondary | ICD-10-CM | POA: Diagnosis not present

## 2019-12-10 DIAGNOSIS — I69351 Hemiplegia and hemiparesis following cerebral infarction affecting right dominant side: Secondary | ICD-10-CM | POA: Diagnosis not present

## 2019-12-10 DIAGNOSIS — Z9181 History of falling: Secondary | ICD-10-CM | POA: Diagnosis not present

## 2019-12-10 DIAGNOSIS — I10 Essential (primary) hypertension: Secondary | ICD-10-CM | POA: Diagnosis not present

## 2019-12-10 DIAGNOSIS — I69328 Other speech and language deficits following cerebral infarction: Secondary | ICD-10-CM | POA: Diagnosis not present

## 2019-12-10 DIAGNOSIS — R569 Unspecified convulsions: Secondary | ICD-10-CM | POA: Diagnosis not present

## 2019-12-13 DIAGNOSIS — I10 Essential (primary) hypertension: Secondary | ICD-10-CM | POA: Diagnosis not present

## 2019-12-13 DIAGNOSIS — H409 Unspecified glaucoma: Secondary | ICD-10-CM | POA: Diagnosis not present

## 2019-12-13 DIAGNOSIS — N182 Chronic kidney disease, stage 2 (mild): Secondary | ICD-10-CM | POA: Diagnosis not present

## 2019-12-13 DIAGNOSIS — G4700 Insomnia, unspecified: Secondary | ICD-10-CM | POA: Diagnosis not present

## 2019-12-13 DIAGNOSIS — E7800 Pure hypercholesterolemia, unspecified: Secondary | ICD-10-CM | POA: Diagnosis not present

## 2019-12-13 DIAGNOSIS — I639 Cerebral infarction, unspecified: Secondary | ICD-10-CM | POA: Diagnosis not present

## 2019-12-13 DIAGNOSIS — N1831 Chronic kidney disease, stage 3a: Secondary | ICD-10-CM | POA: Diagnosis not present

## 2019-12-13 DIAGNOSIS — M1990 Unspecified osteoarthritis, unspecified site: Secondary | ICD-10-CM | POA: Diagnosis not present

## 2019-12-17 DIAGNOSIS — I69328 Other speech and language deficits following cerebral infarction: Secondary | ICD-10-CM | POA: Diagnosis not present

## 2019-12-17 DIAGNOSIS — M5030 Other cervical disc degeneration, unspecified cervical region: Secondary | ICD-10-CM | POA: Diagnosis not present

## 2019-12-17 DIAGNOSIS — I69998 Other sequelae following unspecified cerebrovascular disease: Secondary | ICD-10-CM | POA: Diagnosis not present

## 2019-12-17 DIAGNOSIS — E11319 Type 2 diabetes mellitus with unspecified diabetic retinopathy without macular edema: Secondary | ICD-10-CM | POA: Diagnosis not present

## 2019-12-17 DIAGNOSIS — Z9181 History of falling: Secondary | ICD-10-CM | POA: Diagnosis not present

## 2019-12-17 DIAGNOSIS — E785 Hyperlipidemia, unspecified: Secondary | ICD-10-CM | POA: Diagnosis not present

## 2019-12-17 DIAGNOSIS — R569 Unspecified convulsions: Secondary | ICD-10-CM | POA: Diagnosis not present

## 2019-12-17 DIAGNOSIS — I69351 Hemiplegia and hemiparesis following cerebral infarction affecting right dominant side: Secondary | ICD-10-CM | POA: Diagnosis not present

## 2019-12-17 DIAGNOSIS — I10 Essential (primary) hypertension: Secondary | ICD-10-CM | POA: Diagnosis not present

## 2019-12-18 DIAGNOSIS — Z9181 History of falling: Secondary | ICD-10-CM | POA: Diagnosis not present

## 2019-12-18 DIAGNOSIS — I10 Essential (primary) hypertension: Secondary | ICD-10-CM | POA: Diagnosis not present

## 2019-12-18 DIAGNOSIS — E11319 Type 2 diabetes mellitus with unspecified diabetic retinopathy without macular edema: Secondary | ICD-10-CM | POA: Diagnosis not present

## 2019-12-18 DIAGNOSIS — I69328 Other speech and language deficits following cerebral infarction: Secondary | ICD-10-CM | POA: Diagnosis not present

## 2019-12-18 DIAGNOSIS — E785 Hyperlipidemia, unspecified: Secondary | ICD-10-CM | POA: Diagnosis not present

## 2019-12-18 DIAGNOSIS — I69351 Hemiplegia and hemiparesis following cerebral infarction affecting right dominant side: Secondary | ICD-10-CM | POA: Diagnosis not present

## 2019-12-18 DIAGNOSIS — M5030 Other cervical disc degeneration, unspecified cervical region: Secondary | ICD-10-CM | POA: Diagnosis not present

## 2019-12-18 DIAGNOSIS — I69998 Other sequelae following unspecified cerebrovascular disease: Secondary | ICD-10-CM | POA: Diagnosis not present

## 2019-12-18 DIAGNOSIS — R569 Unspecified convulsions: Secondary | ICD-10-CM | POA: Diagnosis not present

## 2019-12-25 DIAGNOSIS — I10 Essential (primary) hypertension: Secondary | ICD-10-CM | POA: Diagnosis not present

## 2019-12-25 DIAGNOSIS — E785 Hyperlipidemia, unspecified: Secondary | ICD-10-CM | POA: Diagnosis not present

## 2019-12-25 DIAGNOSIS — M5030 Other cervical disc degeneration, unspecified cervical region: Secondary | ICD-10-CM | POA: Diagnosis not present

## 2019-12-25 DIAGNOSIS — I69351 Hemiplegia and hemiparesis following cerebral infarction affecting right dominant side: Secondary | ICD-10-CM | POA: Diagnosis not present

## 2019-12-25 DIAGNOSIS — I69328 Other speech and language deficits following cerebral infarction: Secondary | ICD-10-CM | POA: Diagnosis not present

## 2019-12-25 DIAGNOSIS — Z9181 History of falling: Secondary | ICD-10-CM | POA: Diagnosis not present

## 2019-12-25 DIAGNOSIS — R569 Unspecified convulsions: Secondary | ICD-10-CM | POA: Diagnosis not present

## 2019-12-25 DIAGNOSIS — E11319 Type 2 diabetes mellitus with unspecified diabetic retinopathy without macular edema: Secondary | ICD-10-CM | POA: Diagnosis not present

## 2019-12-25 DIAGNOSIS — I69998 Other sequelae following unspecified cerebrovascular disease: Secondary | ICD-10-CM | POA: Diagnosis not present

## 2019-12-26 DIAGNOSIS — N1831 Chronic kidney disease, stage 3a: Secondary | ICD-10-CM | POA: Diagnosis not present

## 2019-12-26 DIAGNOSIS — Z23 Encounter for immunization: Secondary | ICD-10-CM | POA: Diagnosis not present

## 2019-12-26 DIAGNOSIS — Z0000 Encounter for general adult medical examination without abnormal findings: Secondary | ICD-10-CM | POA: Diagnosis not present

## 2019-12-26 DIAGNOSIS — E11319 Type 2 diabetes mellitus with unspecified diabetic retinopathy without macular edema: Secondary | ICD-10-CM | POA: Diagnosis not present

## 2019-12-26 DIAGNOSIS — Z7189 Other specified counseling: Secondary | ICD-10-CM | POA: Diagnosis not present

## 2019-12-26 DIAGNOSIS — E7800 Pure hypercholesterolemia, unspecified: Secondary | ICD-10-CM | POA: Diagnosis not present

## 2019-12-26 DIAGNOSIS — R6889 Other general symptoms and signs: Secondary | ICD-10-CM | POA: Diagnosis not present

## 2019-12-26 DIAGNOSIS — Z1389 Encounter for screening for other disorder: Secondary | ICD-10-CM | POA: Diagnosis not present

## 2019-12-26 DIAGNOSIS — R569 Unspecified convulsions: Secondary | ICD-10-CM | POA: Diagnosis not present

## 2019-12-26 DIAGNOSIS — R269 Unspecified abnormalities of gait and mobility: Secondary | ICD-10-CM | POA: Diagnosis not present

## 2019-12-26 DIAGNOSIS — I10 Essential (primary) hypertension: Secondary | ICD-10-CM | POA: Diagnosis not present

## 2019-12-26 DIAGNOSIS — G8191 Hemiplegia, unspecified affecting right dominant side: Secondary | ICD-10-CM | POA: Diagnosis not present

## 2019-12-27 DIAGNOSIS — I69328 Other speech and language deficits following cerebral infarction: Secondary | ICD-10-CM | POA: Diagnosis not present

## 2019-12-27 DIAGNOSIS — R569 Unspecified convulsions: Secondary | ICD-10-CM | POA: Diagnosis not present

## 2019-12-27 DIAGNOSIS — I69351 Hemiplegia and hemiparesis following cerebral infarction affecting right dominant side: Secondary | ICD-10-CM | POA: Diagnosis not present

## 2019-12-27 DIAGNOSIS — Z9181 History of falling: Secondary | ICD-10-CM | POA: Diagnosis not present

## 2019-12-27 DIAGNOSIS — I10 Essential (primary) hypertension: Secondary | ICD-10-CM | POA: Diagnosis not present

## 2019-12-27 DIAGNOSIS — E11319 Type 2 diabetes mellitus with unspecified diabetic retinopathy without macular edema: Secondary | ICD-10-CM | POA: Diagnosis not present

## 2019-12-27 DIAGNOSIS — M5030 Other cervical disc degeneration, unspecified cervical region: Secondary | ICD-10-CM | POA: Diagnosis not present

## 2019-12-27 DIAGNOSIS — E785 Hyperlipidemia, unspecified: Secondary | ICD-10-CM | POA: Diagnosis not present

## 2019-12-27 DIAGNOSIS — I69998 Other sequelae following unspecified cerebrovascular disease: Secondary | ICD-10-CM | POA: Diagnosis not present

## 2020-01-03 DIAGNOSIS — I69328 Other speech and language deficits following cerebral infarction: Secondary | ICD-10-CM | POA: Diagnosis not present

## 2020-01-03 DIAGNOSIS — I69998 Other sequelae following unspecified cerebrovascular disease: Secondary | ICD-10-CM | POA: Diagnosis not present

## 2020-01-03 DIAGNOSIS — I10 Essential (primary) hypertension: Secondary | ICD-10-CM | POA: Diagnosis not present

## 2020-01-03 DIAGNOSIS — Z9181 History of falling: Secondary | ICD-10-CM | POA: Diagnosis not present

## 2020-01-03 DIAGNOSIS — M5030 Other cervical disc degeneration, unspecified cervical region: Secondary | ICD-10-CM | POA: Diagnosis not present

## 2020-01-03 DIAGNOSIS — R569 Unspecified convulsions: Secondary | ICD-10-CM | POA: Diagnosis not present

## 2020-01-03 DIAGNOSIS — E11319 Type 2 diabetes mellitus with unspecified diabetic retinopathy without macular edema: Secondary | ICD-10-CM | POA: Diagnosis not present

## 2020-01-03 DIAGNOSIS — E785 Hyperlipidemia, unspecified: Secondary | ICD-10-CM | POA: Diagnosis not present

## 2020-01-03 DIAGNOSIS — I69351 Hemiplegia and hemiparesis following cerebral infarction affecting right dominant side: Secondary | ICD-10-CM | POA: Diagnosis not present

## 2020-01-04 DIAGNOSIS — I69328 Other speech and language deficits following cerebral infarction: Secondary | ICD-10-CM | POA: Diagnosis not present

## 2020-01-04 DIAGNOSIS — Z9181 History of falling: Secondary | ICD-10-CM | POA: Diagnosis not present

## 2020-01-04 DIAGNOSIS — R569 Unspecified convulsions: Secondary | ICD-10-CM | POA: Diagnosis not present

## 2020-01-04 DIAGNOSIS — I69998 Other sequelae following unspecified cerebrovascular disease: Secondary | ICD-10-CM | POA: Diagnosis not present

## 2020-01-04 DIAGNOSIS — I69351 Hemiplegia and hemiparesis following cerebral infarction affecting right dominant side: Secondary | ICD-10-CM | POA: Diagnosis not present

## 2020-01-04 DIAGNOSIS — E785 Hyperlipidemia, unspecified: Secondary | ICD-10-CM | POA: Diagnosis not present

## 2020-01-04 DIAGNOSIS — I10 Essential (primary) hypertension: Secondary | ICD-10-CM | POA: Diagnosis not present

## 2020-01-04 DIAGNOSIS — M5030 Other cervical disc degeneration, unspecified cervical region: Secondary | ICD-10-CM | POA: Diagnosis not present

## 2020-01-04 DIAGNOSIS — E11319 Type 2 diabetes mellitus with unspecified diabetic retinopathy without macular edema: Secondary | ICD-10-CM | POA: Diagnosis not present

## 2020-01-14 DIAGNOSIS — I639 Cerebral infarction, unspecified: Secondary | ICD-10-CM | POA: Diagnosis not present

## 2020-01-14 DIAGNOSIS — M1990 Unspecified osteoarthritis, unspecified site: Secondary | ICD-10-CM | POA: Diagnosis not present

## 2020-01-14 DIAGNOSIS — N1831 Chronic kidney disease, stage 3a: Secondary | ICD-10-CM | POA: Diagnosis not present

## 2020-01-14 DIAGNOSIS — E113299 Type 2 diabetes mellitus with mild nonproliferative diabetic retinopathy without macular edema, unspecified eye: Secondary | ICD-10-CM | POA: Diagnosis not present

## 2020-01-14 DIAGNOSIS — I10 Essential (primary) hypertension: Secondary | ICD-10-CM | POA: Diagnosis not present

## 2020-01-14 DIAGNOSIS — G4700 Insomnia, unspecified: Secondary | ICD-10-CM | POA: Diagnosis not present

## 2020-01-14 DIAGNOSIS — H409 Unspecified glaucoma: Secondary | ICD-10-CM | POA: Diagnosis not present

## 2020-01-14 DIAGNOSIS — E7800 Pure hypercholesterolemia, unspecified: Secondary | ICD-10-CM | POA: Diagnosis not present

## 2020-01-14 DIAGNOSIS — N182 Chronic kidney disease, stage 2 (mild): Secondary | ICD-10-CM | POA: Diagnosis not present

## 2020-01-15 DIAGNOSIS — R6889 Other general symptoms and signs: Secondary | ICD-10-CM | POA: Diagnosis not present

## 2020-02-19 DIAGNOSIS — N182 Chronic kidney disease, stage 2 (mild): Secondary | ICD-10-CM | POA: Diagnosis not present

## 2020-02-19 DIAGNOSIS — M1990 Unspecified osteoarthritis, unspecified site: Secondary | ICD-10-CM | POA: Diagnosis not present

## 2020-02-19 DIAGNOSIS — I639 Cerebral infarction, unspecified: Secondary | ICD-10-CM | POA: Diagnosis not present

## 2020-02-19 DIAGNOSIS — H409 Unspecified glaucoma: Secondary | ICD-10-CM | POA: Diagnosis not present

## 2020-02-19 DIAGNOSIS — N1831 Chronic kidney disease, stage 3a: Secondary | ICD-10-CM | POA: Diagnosis not present

## 2020-02-19 DIAGNOSIS — G4700 Insomnia, unspecified: Secondary | ICD-10-CM | POA: Diagnosis not present

## 2020-02-19 DIAGNOSIS — I10 Essential (primary) hypertension: Secondary | ICD-10-CM | POA: Diagnosis not present

## 2020-02-19 DIAGNOSIS — E7800 Pure hypercholesterolemia, unspecified: Secondary | ICD-10-CM | POA: Diagnosis not present

## 2020-02-26 ENCOUNTER — Ambulatory Visit: Payer: Medicare HMO | Admitting: Podiatry

## 2020-03-18 DIAGNOSIS — H409 Unspecified glaucoma: Secondary | ICD-10-CM | POA: Diagnosis not present

## 2020-03-18 DIAGNOSIS — M1990 Unspecified osteoarthritis, unspecified site: Secondary | ICD-10-CM | POA: Diagnosis not present

## 2020-03-18 DIAGNOSIS — N1831 Chronic kidney disease, stage 3a: Secondary | ICD-10-CM | POA: Diagnosis not present

## 2020-03-18 DIAGNOSIS — I639 Cerebral infarction, unspecified: Secondary | ICD-10-CM | POA: Diagnosis not present

## 2020-03-18 DIAGNOSIS — E7800 Pure hypercholesterolemia, unspecified: Secondary | ICD-10-CM | POA: Diagnosis not present

## 2020-03-18 DIAGNOSIS — G4700 Insomnia, unspecified: Secondary | ICD-10-CM | POA: Diagnosis not present

## 2020-03-18 DIAGNOSIS — I10 Essential (primary) hypertension: Secondary | ICD-10-CM | POA: Diagnosis not present

## 2020-04-22 DIAGNOSIS — I10 Essential (primary) hypertension: Secondary | ICD-10-CM | POA: Diagnosis not present

## 2020-04-22 DIAGNOSIS — E7800 Pure hypercholesterolemia, unspecified: Secondary | ICD-10-CM | POA: Diagnosis not present

## 2020-04-22 DIAGNOSIS — H409 Unspecified glaucoma: Secondary | ICD-10-CM | POA: Diagnosis not present

## 2020-04-22 DIAGNOSIS — I639 Cerebral infarction, unspecified: Secondary | ICD-10-CM | POA: Diagnosis not present

## 2020-04-22 DIAGNOSIS — M1990 Unspecified osteoarthritis, unspecified site: Secondary | ICD-10-CM | POA: Diagnosis not present

## 2020-04-22 DIAGNOSIS — N182 Chronic kidney disease, stage 2 (mild): Secondary | ICD-10-CM | POA: Diagnosis not present

## 2020-04-22 DIAGNOSIS — G4700 Insomnia, unspecified: Secondary | ICD-10-CM | POA: Diagnosis not present

## 2020-04-22 DIAGNOSIS — N1831 Chronic kidney disease, stage 3a: Secondary | ICD-10-CM | POA: Diagnosis not present

## 2020-05-19 DIAGNOSIS — R000 Tachycardia, unspecified: Secondary | ICD-10-CM | POA: Diagnosis not present

## 2020-05-19 DIAGNOSIS — I10 Essential (primary) hypertension: Secondary | ICD-10-CM | POA: Diagnosis not present

## 2020-05-19 DIAGNOSIS — N182 Chronic kidney disease, stage 2 (mild): Secondary | ICD-10-CM | POA: Diagnosis not present

## 2020-05-19 DIAGNOSIS — E1165 Type 2 diabetes mellitus with hyperglycemia: Secondary | ICD-10-CM | POA: Diagnosis not present

## 2020-05-19 DIAGNOSIS — H409 Unspecified glaucoma: Secondary | ICD-10-CM | POA: Diagnosis not present

## 2020-05-19 DIAGNOSIS — E7800 Pure hypercholesterolemia, unspecified: Secondary | ICD-10-CM | POA: Diagnosis not present

## 2020-05-19 DIAGNOSIS — M1990 Unspecified osteoarthritis, unspecified site: Secondary | ICD-10-CM | POA: Diagnosis not present

## 2020-05-19 DIAGNOSIS — I639 Cerebral infarction, unspecified: Secondary | ICD-10-CM | POA: Diagnosis not present

## 2020-05-19 DIAGNOSIS — R0689 Other abnormalities of breathing: Secondary | ICD-10-CM | POA: Diagnosis not present

## 2020-05-19 DIAGNOSIS — N1831 Chronic kidney disease, stage 3a: Secondary | ICD-10-CM | POA: Diagnosis not present

## 2020-05-19 DIAGNOSIS — R4020 Unspecified coma: Secondary | ICD-10-CM | POA: Diagnosis not present

## 2020-05-19 DIAGNOSIS — G4700 Insomnia, unspecified: Secondary | ICD-10-CM | POA: Diagnosis not present

## 2020-05-20 ENCOUNTER — Ambulatory Visit: Payer: Medicare HMO | Admitting: Podiatry

## 2020-05-31 DIAGNOSIS — 419620001 Death: Secondary | SNOMED CT | POA: Diagnosis not present

## 2020-05-31 DEATH — deceased
